# Patient Record
Sex: Male | Born: 1955
Health system: Southern US, Community
[De-identification: ages and names within clinical notes are randomized; demographics above are authoritative.]

## PROBLEM LIST (undated history)

## (undated) DIAGNOSIS — G8929 Other chronic pain: Secondary | ICD-10-CM

## (undated) DIAGNOSIS — M545 Low back pain: Secondary | ICD-10-CM

## (undated) DIAGNOSIS — K219 Gastro-esophageal reflux disease without esophagitis: Secondary | ICD-10-CM

## (undated) HISTORY — DX: Gastro-esophageal reflux disease without esophagitis: K21.9

## (undated) HISTORY — DX: Low back pain: M54.5

## (undated) HISTORY — DX: Other chronic pain: G89.29

---

## 2008-02-15 DIAGNOSIS — M545 Low back pain, unspecified: Secondary | ICD-10-CM

## 2008-02-15 DIAGNOSIS — G8929 Other chronic pain: Secondary | ICD-10-CM

## 2008-02-15 HISTORY — DX: Other chronic pain: G89.29

## 2008-02-15 HISTORY — DX: Low back pain, unspecified: M54.50

## 2014-03-05 ENCOUNTER — Encounter (HOSPITAL_COMMUNITY): Payer: Self-pay | Admitting: Physical Medicine and Rehabilitation

## 2014-03-05 ENCOUNTER — Emergency Department (HOSPITAL_COMMUNITY)
Admission: EM | Admit: 2014-03-05 | Discharge: 2014-03-05 | Disposition: A | Discharge status: Home / Self Care | Payer: Medicaid Other | Attending: Emergency Medicine | Admitting: Emergency Medicine

## 2014-03-05 DIAGNOSIS — Z720 Tobacco use: Secondary | ICD-10-CM | POA: Diagnosis not present

## 2014-03-05 DIAGNOSIS — M545 Low back pain, unspecified: Secondary | ICD-10-CM

## 2014-03-05 DIAGNOSIS — M549 Dorsalgia, unspecified: Secondary | ICD-10-CM | POA: Diagnosis present

## 2014-03-05 MED ORDER — HYDROCODONE-ACETAMINOPHEN 5-325 MG PO TABS: 1.0000 | ORAL_TABLET | ORAL | Status: DC | PRN

## 2014-03-05 MED ORDER — PREDNISONE 20 MG PO TABS
60.0000 mg | ORAL_TABLET | Freq: Once | ORAL | Status: AC
  Administered 2014-03-05: 60 mg via ORAL
  Filled 2014-03-05: qty 3

## 2014-03-05 MED ORDER — PREDNISONE 20 MG PO TABS: 40.0000 mg | ORAL_TABLET | Freq: Every day | ORAL | Status: DC

## 2014-03-05 MED ORDER — HYDROCODONE-ACETAMINOPHEN 5-325 MG PO TABS
1.0000 | ORAL_TABLET | Freq: Once | ORAL | Status: AC
  Administered 2014-03-05: 1 via ORAL
  Filled 2014-03-05: qty 1

## 2014-03-05 NOTE — ED Notes (Signed)
EDP at bedside  

## 2014-03-05 NOTE — ED Notes (Signed)
Pt states lower back pain, ongoing x7 years. States pain became worse last night. 9/10 pain upon arrival to ED. No signs of distress noted.
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
## 2014-03-05 NOTE — ED Notes (Signed)
Pt came from [place] 1 month and 2 weeks ago. Pt has no PCP.

## 2014-03-05 NOTE — Discharge Instructions (Signed)
Please follow the directions provided. Use the resource guide to establish care with a primary care provider. Take the prednisone daily for 5 days to help with pain and inflammation in your back. You may take the Vicodin as needed for back pain to help you rest. Do not drive or work while taking the Vicodin, because it can make you sleepy. Don't hesitate to return for any new, worsening, or concerning symptoms.  SEEK IMMEDIATE MEDICAL CARE IF:  You have pain that radiates from your back into your legs.  You develop new bowel or bladder control problems.  You have unusual weakness or numbness in your arms or legs.  You develop nausea or vomiting.  You develop abdominal pain.  You feel faint.    Emergency Department Resource Guide 1) Find a Doctor and Pay Out of Pocket Although you won't have to find out who is covered by your insurance plan, it is a good idea to ask around and get recommendations. You will then need to call the office and see if the doctor you have chosen will accept you as a new patient and what types of options they offer for patients who are self-pay. Some doctors offer discounts or will set up payment plans for their patients who do not have insurance, but you will need to ask so you aren't surprised when you get to your appointment.  2) Contact Your Local Health Department Not all health departments have doctors that can see patients for sick visits, but many do, so it is worth a call to see if yours does. If you don't know where your local health department is, you can check in your phone book. The CDC also has a tool to help you locate your state's health department, and many state websites also have listings of all of their local health departments.  3) Find a [facility] If your illness is not likely to be very severe or complicated, you may want to try a walk in clinic. These are popping up all over the country in pharmacies, drugstores, and shopping centers. They're  usually staffed by nurse practitioners or physician assistants that have been trained to treat common illnesses and complaints. They're usually fairly quick and inexpensive. However, if you have serious medical issues or chronic medical problems, these are probably not your best option.  No Primary Care Doctor: - Call Health Connect at  [phone] - they can help you locate a primary care doctor that  accepts your insurance, provides certain services, etc. - Physician Referral Service- [phone]  Chronic Pain Problems: Organization         Address  Phone   Notes  [facility]  [phone] Patients need to be referred by their primary care doctor.   Medication Assistance: Organization         Address  Phone   Notes  [facility] Medication [facility] [address] [place], [place] [address] [phone] --Must be a resident of [facility] -- Must have NO insurance coverage whatsoever (no Medicaid/ Medicare, etc.) -- The pt. MUST have a primary care doctor that directs their care regularly and follows them in the community   MedAssist  [phone]   [employer]  [phone]    Agencies that provide inexpensive medical care: Organization         Address  Phone   Notes  [place]  [phone]   [facility]    (  [phone]   [place], [place] [address] [phone]   [place] [address] [place] [phone]   Planned Parenthood    [phone]   [facility]    [phone]   [place] and [address], [place] Phone:  [phone], Fax:  [phone] Hours of Operation:  9 am - 6 pm, M-F.  Also accepts Medicaid/Medicare and self-pay.  [place] for [place] [address], [address], [place] Phone: [phone], Fax: [phone]. Hours  of Operation:  8:30 am - 5:30 pm, M-F.  Also accepts Medicaid and self-pay.  [facility] [place] [address], [place] Phone: [phone]   [place], [place], [place] [phone], Ext. [phone] Mondays & Thursdays: 7-9 AM.  First 15 patients are seen on a first come, first serve basis.    [place] Providers:  Organization         Address  Phone   Notes  [facility] [address], [place] [phone] Also accepts self-pay patients.  [facility] [address], [place]  [phone]   [employer], [address], [place] [phone]   [facility] [address], [place] [phone]   Lucianne Lei [address], [place]   [phone] Only accepts [place] Access [place] patients after they have their name applied to their card.   Self-Pay (no insurance) in [facility]:  Organization         Address  Phone   Notes  Sickle Cell Patients, [facility] [place] [phone]   [facility] [place] [phone]   [facility] [place]  [place], [place], [place] [phone]   [facility]/Dr. Osei-Bonsu  [address] [place] or [address], [place] [phone] Phone number for both [place] and [place] locations is the same.  Urgent Medical and [facility] [address] [place] [phone]   [facility] [address] [place] or [address] [phone]   [facility] [address] [place] [phone], phone; [phone], fax Sees patients 1st and 3rd Saturday of every month.  Must not qualify for public or private insurance (i.e. Medicaid,  Medicare, [place] Health Choice, Veterans' Benefits)  Household income should be no more than 200% of the poverty level The clinic cannot treat you if you are pregnant or think you are pregnant  Sexually transmitted diseases are not treated at the clinic.    Dental Care: Organization         Address  Phone  Notes  [facility] Department of [facility] [place] [phone] Accepts children up to age 84 who are enrolled in [place] or [place]; pregnant women with a Medicaid card; and children who have applied for Medicaid or [place] Health Choice, but were declined, whose parents can pay a reduced fee at time of service.  [facility] Department of [facility]  [address], [place] [phone] Accepts children up  to age 42 who are enrolled in Medicaid or [place] Health Choice; pregnant women with a Medicaid card; and children who have applied for Medicaid or [place] Health Choice, but were declined, whose parents can pay a reduced fee at time of service.  [place] Adult Dental Access PROGRAM  [place] [phone] Patients are seen by appointment only. Walk-ins are not accepted. [place] will see patients 36 years of age and older. Monday - Tuesday (8am-5pm) Most Wednesdays (8:30-5pm) $30 per visit, cash only  [facility] Adult Dental Access PROGRAM  [address], [facility] [phone] Patients are seen by appointment only. Walk-ins are not accepted. [facility] will see patients 21 years of age and older. One Wednesday Evening (Monthly: Volunteer Based).  $30 per visit, cash only  [place]  [phone] for adults; Children under age 43, call Graduate Pediatric Dentistry at [phone]. Children aged 39-14, please call [phone] to request a pediatric application.  Dental services are provided in all areas of dental care including fillings, crowns  and bridges, complete and partial dentures, implants, gum treatment, root canals, and extractions. Preventive care is also provided. Treatment is provided to both adults and children. Patients are selected via a lottery and there is often a waiting list.   [facility] [address] [place]  [phone] [email or web address]   [facility] [address] [place], [place] [phone], Ext. [phone] Second and Fourth Thursday of each month, opens at 6:30 AM; Clinic ends at 9 AM.  Patients are seen on a first-come first-served basis, and a limited number are seen during each clinic.   [facility]  [address] [place], [place] [phone]   Eligibility Requirements You must have lived in [place], [place], or [place] counties for at least the last three months.   You cannot be eligible for state or federal sponsored [employer], including [employer], [place], or [employer].   You generally cannot be eligible for healthcare insurance through your employer.    How to apply: Eligibility screenings are held every Tuesday and Wednesday afternoon from 1:00 pm until 4:00 pm. You do not need an appointment for the interview!  [facility] [address] [place], [place]   [place]  [place]  [place]  [phone]    Behavioral Health Resources in the Community: Intensive Outpatient Programs Organization         Address  Phone  Notes  [place]. [address], [place], [place] [phone]   [facility] Outpatient [address], [place], [place]   ADS: Alcohol & Drug Svcs [address] [place], [place]   [place] [address]  [place], [place] or [phone]   Substance Abuse  Resources Organization         Address  Phone  Notes  Alcohol and Drug Services  [phone]   [place]  [phone]   [facility] [place]   Chinita Pester  [phone]   Residential & Outpatient Substance Abuse Program  [phone]   Psychological Services Organization         Address  Phone  Notes  [place]  [place]  [place]   [place]  [address] [place] or [phone]    Mobile Crisis Teams Organization         Address  Phone  Notes  [facility], Mobile Crisis Care Unit  [phone]   [facility]  [address] [place], [place]   [place] [address] [place] [phone]    Self-Help/Support Groups Organization         Address  Phone             Notes  [place] of [place] - variety of support groups  [place] Call for more information  Narcotics Anonymous (NA), [facility]aring [facility] [address], [employer] [place]  2 meetings at this location   Special educational needs teacher         Address  Phone  Notes  [facility]idential Treatment [place],    [place]  [phone]   [facility]  [address], [place] [phone], [place], [place]   [place], [place] [phone] Admissions: 8am-3pm M-F  Incentives Substance [place] [address],    [place], [place] [phone]   [facility] [address] [place], [place], [place]   [facility] [address]  [place], [place]   [facility]ight Programs - Intensive Outpatient [address], [place], [place]   [facility] [place].,  [place], [place] [phone] or [phone]   Residential Treatment Services (RTS) [address] [place], [place] Accepts Medicaid  [facility] [place] [address]  [place] [phone] Substance Abuse/Addiction Treatment   [facility] Organization         Address  Phone  Notes  [facility]  [phone]   Domenic Schwab, PhD [address] Arlis [place], [place]   [phone] or [phone]   [facility] [place], [place] [phone]   [facility] [address] [place], [place] [phone] Insurance/Medicaid/sponsorship through [facility] and Families [address] [place]                                    [place], [place] [phone] [place] [address], [place] [phone]    Dr. Adele Schilder  [phone]   [facility] Dept. 1) [address] [place] 2) [place] 3)  [place] [address], [place] [phone]  [phone]   [place] [phone] or [phone] (After Hours)

## 2014-03-08 NOTE — Progress Notes (Signed)
ED CM spoke with patient via interpreter  Phone regarding establishing care with the [facility], patient agreeable. Provided address and phone number to [facility] via interpreter, patient encouraged to go over to the clinic when he is discharged today to establish. [place] contacted regarding patient coming in after discharge to [facility] care, scheduler are agreeable waiting on patient to arrival. No further ED CM needs identified.

## 2014-03-17 ENCOUNTER — Ambulatory Visit: Payer: Medicaid Other | Attending: Family Medicine | Admitting: Family Medicine

## 2014-03-17 ENCOUNTER — Encounter: Payer: Self-pay | Admitting: Family Medicine

## 2014-03-17 VITALS — BP 102/67 | HR 90 | Temp 98.2°F | Resp 16 | Ht 65.75 in | Wt 148.8 lb

## 2014-03-17 DIAGNOSIS — R1084 Generalized abdominal pain: Secondary | ICD-10-CM

## 2014-03-17 DIAGNOSIS — R202 Paresthesia of skin: Secondary | ICD-10-CM

## 2014-03-17 DIAGNOSIS — Z114 Encounter for screening for human immunodeficiency virus [HIV]: Secondary | ICD-10-CM | POA: Insufficient documentation

## 2014-03-17 DIAGNOSIS — Z87891 Personal history of nicotine dependence: Secondary | ICD-10-CM | POA: Insufficient documentation

## 2014-03-17 DIAGNOSIS — G8929 Other chronic pain: Secondary | ICD-10-CM

## 2014-03-17 DIAGNOSIS — M545 Low back pain, unspecified: Secondary | ICD-10-CM | POA: Insufficient documentation

## 2014-03-17 DIAGNOSIS — Z720 Tobacco use: Secondary | ICD-10-CM

## 2014-03-17 DIAGNOSIS — F17200 Nicotine dependence, unspecified, uncomplicated: Secondary | ICD-10-CM

## 2014-03-17 DIAGNOSIS — Z113 Encounter for screening for infections with a predominantly sexual mode of transmission: Secondary | ICD-10-CM

## 2014-03-17 LAB — CBC
HEMATOCRIT: 46.3 % (ref 39.0–52.0)
Hemoglobin: 15.7 g/dL (ref 13.0–17.0)
MCH: 32.4 pg (ref 26.0–34.0)
MCHC: 33.9 g/dL (ref 30.0–36.0)
MCV: 95.5 fL (ref 78.0–100.0)
MPV: 9.3 fL (ref 8.6–12.4)
Platelets: 247 10*3/uL (ref 150–400)
RBC: 4.85 MIL/uL (ref 4.22–5.81)
RDW: 14 % (ref 11.5–15.5)
WBC: 6.3 10*3/uL (ref 4.0–10.5)

## 2014-03-17 LAB — POCT URINALYSIS DIPSTICK
Bilirubin, UA: NEGATIVE
Glucose, UA: NEGATIVE
KETONES UA: NEGATIVE
Nitrite, UA: NEGATIVE
PH UA: 5.5
Protein, UA: NEGATIVE
Spec Grav, UA: 1.02
Urobilinogen, UA: 1

## 2014-03-17 LAB — COMPLETE METABOLIC PANEL WITH GFR
ALBUMIN: 3.8 g/dL (ref 3.5–5.2)
ALK PHOS: 47 U/L (ref 39–117)
ALT: 27 U/L (ref 0–53)
AST: 27 U/L (ref 0–37)
BILIRUBIN TOTAL: 0.7 mg/dL (ref 0.2–1.2)
BUN: 10 mg/dL (ref 6–23)
CHLORIDE: 109 meq/L (ref 96–112)
CO2: 27 meq/L (ref 19–32)
Calcium: 9 mg/dL (ref 8.4–10.5)
Creat: 0.68 mg/dL (ref 0.50–1.35)
GFR, Est African American: >89 mL/min
Glucose, Bld: 86 mg/dL (ref 70–99)
Potassium: 4.6 mEq/L (ref 3.5–5.3)
Sodium: 140 mEq/L (ref 135–145)
Total Protein: 6.2 g/dL (ref 6.0–8.3)

## 2014-03-17 LAB — GLUCOSE, POCT (MANUAL RESULT ENTRY): POC Glucose: 112 mg/dl — AB (ref 70–99)

## 2014-03-17 LAB — POCT GLYCOSYLATED HEMOGLOBIN (HGB A1C): HEMOGLOBIN A1C: 5.5

## 2014-03-17 MED ORDER — NAPROXEN 500 MG PO TABS: 500.0000 mg | ORAL_TABLET | Freq: Two times a day (BID) | ORAL | Status: DC | PRN

## 2014-03-17 MED ORDER — GABAPENTIN 300 MG PO CAPS: 300.0000 mg | ORAL_CAPSULE | Freq: Three times a day (TID) | ORAL | Status: DC

## 2014-03-17 MED ORDER — NAPROXEN 500 MG PO TABS: 500.0000 mg | ORAL_TABLET | Freq: Two times a day (BID) | ORAL | Status: DC

## 2014-03-17 NOTE — Progress Notes (Signed)
Patient is here to establish care He was recently seen in the ED for chronic back pain, abdominal pain, and burning in his hands and feet. He is a current 0.5 ppd smoker and reports he drinks beer on the weekends Patient vaccinations are current

## 2014-03-17 NOTE — Assessment & Plan Note (Signed)
Screening HIV  

## 2014-03-17 NOTE — Patient Instructions (Signed)
[name],   Thank you for coming in today. It was a pleasure meeting you. I look forward to being your primary doctor.   1. Chronic low back pain with bulging disc: Start gabapentin 300 mg at night for 3 days, then twice daily for 3 days, then three time daily. Take naproxen, antiinflammatory twice daily with food.  Referral to physical therapy.   You will be called with lab results  F/u in 6 weeks for f/u low back pain and smoking cessation.   Dr. Adrian [place]
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
## 2014-03-17 NOTE — Assessment & Plan Note (Signed)
1. Chronic low back pain with bulging disc: Start gabapentin 300 mg at night for 3 days, then twice daily for 3 days, then three time daily. Take naproxen, antiinflammatory twice daily with food.  Referral to physical therapy.

## 2014-03-18 ENCOUNTER — Telehealth: Payer: Self-pay | Admitting: *Deleted

## 2014-03-18 LAB — HIV ANTIBODY (ROUTINE TESTING W REFLEX): HIV 1&2 Ab, 4th Generation: NONREACTIVE

## 2014-03-18 NOTE — Telephone Encounter (Signed)
-----   Message from Minerva Ends, MD sent at 03/18/2014  9:20 AM EST ----- Normal CBC, CMP and HIV negative

## 2014-03-18 NOTE — Telephone Encounter (Signed)
[employer] [place] [phone] Pt aware of lab resuts

## 2014-03-31 ENCOUNTER — Ambulatory Visit: Payer: Medicaid Other

## 2014-04-15 ENCOUNTER — Ambulatory Visit: Payer: Medicaid Other

## 2014-04-17 ENCOUNTER — Ambulatory Visit: Payer: Medicaid Other | Attending: Family Medicine

## 2014-04-17 DIAGNOSIS — G8929 Other chronic pain: Secondary | ICD-10-CM | POA: Diagnosis not present

## 2014-04-17 DIAGNOSIS — M545 Low back pain, unspecified: Secondary | ICD-10-CM

## 2014-04-17 NOTE — Patient Instructions (Signed)
Discussed fact <Medicaid does not pay for PT for LBP. We will contact vendor for home TENS trial

## 2014-04-17 NOTE — Therapy (Signed)
[place], [place], [address] Phone: [phone]   Fax:  [phone]  Physical Therapy Evaluation  Patient Details  Name: Nathaniel Robinson MRN: [id number] Date of Birth: 07/20/1955 Referring Provider:  Minerva Ends, MD  Encounter Date: 04/17/2014      PT End of Session - 04/17/14 0902    Visit Number 1   Number of Visits 1   PT Start Time 0840   PT Stop Time 0920   PT Time Calculation (min) 40 min   Activity Tolerance Patient tolerated treatment well   Behavior During Therapy [facility] for tasks assessed/performed      Past Medical History  Diagnosis Date  . Chronic low back pain 2010     No past surgical history on file.  There were no vitals taken for this visit.  Visit Diagnosis:  Chronic lower back pain - Plan: PT plan of care cert/re-cert      Subjective Assessment - 04/17/14 0842    Symptoms He reports lower back pain . 5 years duration. No therapy before   Pertinent History No injury   Patient Stated Goals Decrease pain   Currently in Pain? Yes   Pain Score 5    Pain Location Back   Pain Orientation Lower   Pain Descriptors / Indicators Constant   Pain Type Chronic pain   Pain Onset More than a month ago   Pain Frequency Constant   Aggravating Factors  walking   Pain Relieving Factors Medication does not help. Always the same   Multiple Pain Sites No          [facility] PT Assessment - 04/17/14 0857    Assessment   Medical Diagnosis chronic LBP   Onset Date --  5 years duration   Prior Therapy None   Precautions   Precautions None   Restrictions   Weight Bearing Restrictions No   Balance Screen   Has the patient fallen in the past 6 months No   Has the patient had a decrease in activity level because of a fear of falling?  No   Is the patient reluctant to leave their home because of a fear of falling?  No   Posture/Postural Control   Posture Comments Appears he may  have  some scoliosis withRT scapula lower than LT    ROM / Strength   AROM / PROM / Strength AROM;Strength;PROM   AROM   AROM Assessment Site Lumbar   Lumbar Flexion he can touch mid tibia   Lumbar Extension decreeased 2/3   Lumbar - Right Side Bend WNL   Lumbar - Left Side Bend WNL   PROM   Overall PROM Comments Hip and SLR WFL   Strength   Overall Strength Comments Normal LE strength and good mobility   Palpation   Palpation Tender lower back and sacral area                  [facility] Adult PT Treatment/Exercise - 04/17/14 0857    Modalities   Modalities Electrical Stimulation   Electrical Stimulation   Electrical Stimulation Location back, lower   Electrical Stimulation Action IFC   Electrical Stimulation Parameters L14   Electrical Stimulation Goals Pain                PT Education - 04/17/14 0902    Education provided Yes   Education Details No coverage with Medicaid, use of TENS unit at home and vendor should call  Person(s) Educated Patient   Methods Explanation   Comprehension Verbalized understanding                    Plan - 04/17/14 0903    Clinical Impression Statement Will contact vendor for home TENS. as chronic back pain not covered for PT   Recommended Other Services TENS vendor   Consulted and Agree with Plan of Care Patient         Problem List Patient Active Problem List   Diagnosis Date Noted  . Current smoker 03/17/2014  . Screening for STD (sexually transmitted disease) 03/17/2014  . Chronic low back pain     Darrel Hoover PT 04/17/2014, 9:10 AM  [facility] [address] [place], [place], [address] Phone: [phone]   Fax:  [phone]  I am discharging him though he said he would consider self pay but later in conversation said he could not do TENS  Unless free  so I dont think he will return for formal PT

## 2014-04-21 ENCOUNTER — Telehealth: Payer: Self-pay | Admitting: Family Medicine

## 2014-04-21 ENCOUNTER — Other Ambulatory Visit: Payer: Self-pay | Admitting: Family Medicine

## 2014-04-21 DIAGNOSIS — G8929 Other chronic pain: Secondary | ICD-10-CM

## 2014-04-21 DIAGNOSIS — M545 Low back pain: Principal | ICD-10-CM

## 2014-04-21 MED ORDER — NAPROXEN 500 MG PO TABS: 500.0000 mg | ORAL_TABLET | Freq: Two times a day (BID) | ORAL | Status: DC | PRN

## 2014-04-21 MED ORDER — GABAPENTIN 300 MG PO CAPS: 300.0000 mg | ORAL_CAPSULE | Freq: Three times a day (TID) | ORAL | Status: DC

## 2014-04-21 NOTE — Telephone Encounter (Signed)
Pt has an appt on 04-29-14 for follow-up. Refills sent. Pt is aware. Stafford Bing, CMA

## 2014-04-21 NOTE — Telephone Encounter (Signed)
Patient would like a refill on all medications; patient has come in today to schedule and appointment and request refills; please f/u with patient about these requests;

## 2014-04-29 ENCOUNTER — Ambulatory Visit (HOSPITAL_BASED_OUTPATIENT_CLINIC_OR_DEPARTMENT_OTHER): Payer: Medicaid Other | Admitting: Family Medicine

## 2014-04-29 ENCOUNTER — Ambulatory Visit (HOSPITAL_COMMUNITY)
Admission: RE | Admit: 2014-04-29 | Discharge: 2014-04-29 | Disposition: A | Discharge status: Home / Self Care | Payer: Medicaid Other | Source: Ambulatory Visit | Attending: Family Medicine | Admitting: Family Medicine

## 2014-04-29 ENCOUNTER — Telehealth: Payer: Self-pay | Admitting: *Deleted

## 2014-04-29 ENCOUNTER — Encounter: Payer: Self-pay | Admitting: Family Medicine

## 2014-04-29 VITALS — BP 115/70 | HR 100 | Temp 97.6°F | Resp 16 | Ht 64.96 in | Wt 147.7 lb

## 2014-04-29 DIAGNOSIS — G8929 Other chronic pain: Secondary | ICD-10-CM

## 2014-04-29 DIAGNOSIS — Z0000 Encounter for general adult medical examination without abnormal findings: Secondary | ICD-10-CM | POA: Diagnosis not present

## 2014-04-29 DIAGNOSIS — M47896 Other spondylosis, lumbar region: Secondary | ICD-10-CM | POA: Insufficient documentation

## 2014-04-29 DIAGNOSIS — F17200 Nicotine dependence, unspecified, uncomplicated: Secondary | ICD-10-CM

## 2014-04-29 DIAGNOSIS — Z720 Tobacco use: Secondary | ICD-10-CM

## 2014-04-29 DIAGNOSIS — M545 Low back pain: Secondary | ICD-10-CM | POA: Diagnosis present

## 2014-04-29 MED ORDER — NAPROXEN 500 MG PO TABS: 500.0000 mg | ORAL_TABLET | Freq: Two times a day (BID) | ORAL | Status: DC | PRN

## 2014-04-29 MED ORDER — CYCLOBENZAPRINE HCL 10 MG PO TABS: 10.0000 mg | ORAL_TABLET | Freq: Three times a day (TID) | ORAL | Status: DC | PRN

## 2014-04-29 NOTE — Patient Instructions (Addendum)
Nathaniel Robinson,  Thank you for coming in today.  1. Low back pain for 5 years Flexeril up to three times daily. Naproxen up to twice daily with food  Low back x-ray Pain management referral  Ortho referral.   F/u in 6 weeks   Dr. Adrian Blackwater

## 2014-04-29 NOTE — Progress Notes (Signed)
   Subjective:    Patient ID: Nathaniel Robinson, male    DOB: 1955/08/24, 59 y.o.   MRN: [id number] CC: chronic low back pain  [place] interpreter on the phone  HPI  1. Back pain: since 2011. Treated with injections in [place] when fist dx. Injections into muscle helped for two weeks. Undergoing PT. TENs unit is making pain worse. Pain is b/l low back, non radiating. Tingling and sharp pains. No fecal or urinary incontinence.    Review of Systems  Constitutional: Negative for fever, diaphoresis and unexpected weight change.  Genitourinary: Negative for urgency.  Musculoskeletal: Positive for myalgias and back pain.  Neurological: Negative for weakness.       Objective:   Physical Exam BP 115/70 mmHg  Pulse 100  Temp(Src) 97.6 F (36.4 C) (Oral)  Resp 16  Ht 5' 4.96" (1.65 m)  Wt 147 lb 11.3 oz (67 kg)  BMI 24.61 kg/m2  SpO2 99% General appearance: alert, cooperative and no distress  Back Exam: Back: Normal Curvature, no deformities or CVA tenderness  Paraspinal Tenderness: b/l L4 and L5  LE Strength 5/5  LE Sensation: in tact  LE Reflexes 2+ and symmetric  Straight leg raise: negative       Assessment & Plan:

## 2014-04-29 NOTE — Assessment & Plan Note (Signed)
A:  Low back pain for 5 years. Suspect DJD, no sciatica on exam.  P: Flexeril up to three times daily. Naproxen up to twice daily with food  Low back x-ray Pain management referral  Ortho referral.

## 2014-04-29 NOTE — Progress Notes (Signed)
Pt is here following up on his chronic back pain. Pt is trying out a tens unit and he said that it does not help. Pt states that he has a spot on his butt that it keeps coming back that is very painful. Interpreter # [phone]

## 2014-04-29 NOTE — Assessment & Plan Note (Signed)
GI referral for screening colonoscopy.

## 2014-04-29 NOTE — Telephone Encounter (Signed)
Pt requesting letter stating he is been seen here for back pain

## 2014-05-02 NOTE — Telephone Encounter (Signed)
Letter written and placed up front for pick up  

## 2014-05-06 ENCOUNTER — Telehealth: Payer: Self-pay | Admitting: Emergency Medicine

## 2014-05-06 NOTE — Telephone Encounter (Signed)
;  anguage line will set up the phone call

## 2014-05-07 ENCOUNTER — Telehealth: Payer: Self-pay | Admitting: Family Medicine

## 2014-05-07 NOTE — Telephone Encounter (Signed)
FYI Only: Patient came in this morning to pick up paperwork filled out by PCP;

## 2014-05-16 ENCOUNTER — Encounter: Payer: Self-pay | Admitting: Family Medicine

## 2014-05-16 ENCOUNTER — Ambulatory Visit: Payer: Medicaid Other | Attending: Family Medicine | Admitting: Family Medicine

## 2014-05-16 DIAGNOSIS — G8929 Other chronic pain: Secondary | ICD-10-CM | POA: Diagnosis not present

## 2014-05-16 DIAGNOSIS — Z720 Tobacco use: Secondary | ICD-10-CM | POA: Diagnosis not present

## 2014-05-16 DIAGNOSIS — F17200 Nicotine dependence, unspecified, uncomplicated: Secondary | ICD-10-CM

## 2014-05-16 DIAGNOSIS — M545 Low back pain: Secondary | ICD-10-CM | POA: Diagnosis not present

## 2014-05-16 MED ORDER — NICOTINE 7 MG/24HR TD PT24: 7.0000 mg | MEDICATED_PATCH | Freq: Every day | TRANSDERMAL | Status: DC

## 2014-05-16 MED ORDER — NAPROXEN 500 MG PO TABS: 500.0000 mg | ORAL_TABLET | Freq: Two times a day (BID) | ORAL | Status: DC | PRN

## 2014-05-16 MED ORDER — CYCLOBENZAPRINE HCL 10 MG PO TABS: 10.0000 mg | ORAL_TABLET | Freq: Three times a day (TID) | ORAL | Status: DC | PRN

## 2014-05-16 MED ORDER — NICOTINE 14 MG/24HR TD PT24: 14.0000 mg | MEDICATED_PATCH | Freq: Every day | TRANSDERMAL | Status: DC

## 2014-05-16 MED ORDER — NICOTINE 21 MG/24HR TD PT24: 21.0000 mg | MEDICATED_PATCH | Freq: Every day | TRANSDERMAL | Status: DC

## 2014-05-16 NOTE — Assessment & Plan Note (Signed)
Smoking:  Smoking cessation support: smoking cessation hotline: 1-800-QUIT-NOW.  Smoking cessation classes are available through [facility] and [facility]. Call 4436145247 or visit our website at [email or web address] Nicotine patch 21 mg for 6 weeks Nicotine patch 14mg  for 2 weeks  Nicotine patch 7 mg for 2 weeks

## 2014-05-16 NOTE — Progress Notes (Signed)
[employer] [phone] F/U Back pain, medication not helping  (Back pain x 5 year) Back pain( worsen in the morning  Requesting MRI referral  Requesting help to quit smoking

## 2014-05-16 NOTE — Patient Instructions (Addendum)
Nathaniel Robinson,  1. Chronic low back pain:  Mild degenerative disease on x-ray  MRI ordered  Continue flexeril (before bed) and naproxen (with food)   2. Smoking:  Smoking cessation support: smoking cessation hotline: 1-800-QUIT-NOW.  Smoking cessation classes are available through [facility] and [facility]. Call (567)694-7488 or visit our website at [email or web address] Nicotine patch 21 mg for 6 weeks Nicotine patch 14mg  for 2 weeks  Nicotine patch 7 mg for 2 weeks   F/u in 6 weeks for low back   Dr. Adrian Blackwater

## 2014-05-16 NOTE — Progress Notes (Signed)
   Subjective:    Patient ID: Bradrick Kamau, male    DOB: [birth year], 59 y.o.   MRN: [id number] CC: chronic low back pain, smoking cessation  HPI  1. Chronic low back pain: x 5-7 years. Low back. R side. Worse in early AM. Flexeril and naproxen helps.   2. Smoking cessation: smoking 1 PPD for 4 months. Prior to that was taking 1/2 PPD. Desires to quit.   Soc Hx: current smoker   Review of Systems  Constitutional: Negative for fever and chills.      Objective:   Physical Exam BP 112/73 mmHg  Pulse 88  Temp(Src) 97.7 F (36.5 C) (Oral)  Resp 16  Wt 150 lb (68.04 kg)  SpO2 99% General appearance: alert, cooperative and no distress Back: mild lumbar lordosis, no spinal tenderness, MSK tightness R low back  Lungs: normal WOB        Assessment & Plan:

## 2014-05-16 NOTE — Assessment & Plan Note (Signed)
Chronic low back pain:  Mild degenerative disease on x-ray  MRI ordered  Continue flexeril (before bed) and naproxen (with food)

## 2014-05-19 ENCOUNTER — Other Ambulatory Visit: Payer: Self-pay | Admitting: Infectious Disease

## 2014-05-19 ENCOUNTER — Ambulatory Visit
Admission: RE | Admit: 2014-05-19 | Discharge: 2014-05-19 | Disposition: A | Discharge status: Home / Self Care | Payer: No Typology Code available for payment source | Source: Ambulatory Visit | Attending: Infectious Disease | Admitting: Infectious Disease

## 2014-05-19 DIAGNOSIS — R7611 Nonspecific reaction to tuberculin skin test without active tuberculosis: Secondary | ICD-10-CM

## 2014-06-06 ENCOUNTER — Ambulatory Visit (HOSPITAL_COMMUNITY)
Admission: RE | Admit: 2014-06-06 | Discharge: 2014-06-06 | Disposition: A | Discharge status: Home / Self Care | Payer: Medicaid Other | Source: Ambulatory Visit | Attending: Family Medicine | Admitting: Family Medicine

## 2014-06-06 ENCOUNTER — Other Ambulatory Visit: Payer: Self-pay | Admitting: Family Medicine

## 2014-06-06 DIAGNOSIS — M5137 Other intervertebral disc degeneration, lumbosacral region: Secondary | ICD-10-CM | POA: Diagnosis not present

## 2014-06-06 DIAGNOSIS — M4806 Spinal stenosis, lumbar region: Secondary | ICD-10-CM | POA: Diagnosis not present

## 2014-06-06 DIAGNOSIS — G8929 Other chronic pain: Secondary | ICD-10-CM | POA: Insufficient documentation

## 2014-06-06 DIAGNOSIS — M545 Low back pain: Secondary | ICD-10-CM

## 2014-06-06 DIAGNOSIS — M5127 Other intervertebral disc displacement, lumbosacral region: Secondary | ICD-10-CM | POA: Insufficient documentation

## 2014-06-06 NOTE — Assessment & Plan Note (Signed)
MRI does reveal spinal stenosis at L4-L5 mostly but this is mild and would not necessarily cause the severity of symptoms patient is experiencing.  Referral to neurosurgery to explore additional treatment options, ? Injections

## 2014-06-10 ENCOUNTER — Ambulatory Visit: Payer: Medicaid Other | Admitting: Family Medicine

## 2014-06-10 ENCOUNTER — Telehealth: Payer: Self-pay | Admitting: Family Medicine

## 2014-06-10 NOTE — Telephone Encounter (Signed)
Patient has come in today to get the results of his MRI; patient states he is in pain and will schedule to next available appointment to discuss with PCP; please f/u with patient about results;

## 2014-06-11 NOTE — Telephone Encounter (Signed)
Pt was given results. Stated will come in tomorrow for more information  He is riding the bus now and is unable to understand

## 2014-06-11 NOTE — Telephone Encounter (Signed)
-----   Message from Boykin Nearing, MD sent at 06/06/2014  1:53 PM EDT ----- MRI does reveal spinal stenosis at L4-L5 mostly but this is mild and would not necessarily cause the severity of symptoms patient is experiencing.  Referral to neurosurgery to explore additional treatment options, ? Injections

## 2014-06-16 ENCOUNTER — Ambulatory Visit (HOSPITAL_BASED_OUTPATIENT_CLINIC_OR_DEPARTMENT_OTHER): Payer: Medicaid Other | Admitting: Family Medicine

## 2014-06-16 ENCOUNTER — Ambulatory Visit (HOSPITAL_COMMUNITY)
Admission: RE | Admit: 2014-06-16 | Discharge: 2014-06-16 | Disposition: A | Discharge status: Home / Self Care | Payer: Medicaid Other | Source: Ambulatory Visit | Attending: Cardiology | Admitting: Cardiology

## 2014-06-16 ENCOUNTER — Other Ambulatory Visit: Payer: Self-pay

## 2014-06-16 ENCOUNTER — Encounter: Payer: Self-pay | Admitting: Family Medicine

## 2014-06-16 VITALS — BP 133/80 | HR 128 | Temp 98.7°F | Resp 18 | Ht 64.0 in | Wt 151.0 lb

## 2014-06-16 DIAGNOSIS — G8929 Other chronic pain: Secondary | ICD-10-CM

## 2014-06-16 DIAGNOSIS — R000 Tachycardia, unspecified: Secondary | ICD-10-CM | POA: Insufficient documentation

## 2014-06-16 DIAGNOSIS — Z201 Contact with and (suspected) exposure to tuberculosis: Secondary | ICD-10-CM

## 2014-06-16 DIAGNOSIS — R7611 Nonspecific reaction to tuberculin skin test without active tuberculosis: Secondary | ICD-10-CM | POA: Insufficient documentation

## 2014-06-16 DIAGNOSIS — M545 Low back pain: Secondary | ICD-10-CM | POA: Diagnosis not present

## 2014-06-16 DIAGNOSIS — I471 Supraventricular tachycardia: Secondary | ICD-10-CM | POA: Diagnosis not present

## 2014-06-16 LAB — POCT URINALYSIS DIPSTICK
Glucose, UA: NEGATIVE
Ketones, UA: NEGATIVE
LEUKOCYTES UA: NEGATIVE
Nitrite, UA: NEGATIVE
RBC UA: NEGATIVE
Spec Grav, UA: >=1.03
UROBILINOGEN UA: 0.2
pH, UA: 5.5

## 2014-06-16 MED ORDER — CYCLOBENZAPRINE HCL 10 MG PO TABS: 10.0000 mg | ORAL_TABLET | Freq: Three times a day (TID) | ORAL | Status: DC | PRN

## 2014-06-16 MED ORDER — NAPROXEN 500 MG PO TABS: 500.0000 mg | ORAL_TABLET | Freq: Two times a day (BID) | ORAL | Status: DC | PRN

## 2014-06-16 NOTE — Assessment & Plan Note (Signed)
Chronic low back pain:  Continue naproxen and flexeril as needed with food. Referrals to pain management and neurosurgery placed.

## 2014-06-16 NOTE — Progress Notes (Signed)
[employer] [place] # [id number] F/U MRI results Stated still in pain

## 2014-06-16 NOTE — Assessment & Plan Note (Signed)
Elevated heart rate today with evidence of dehydration: Drink extra water for the next 2-3 days

## 2014-06-16 NOTE — Patient Instructions (Signed)
Nathaniel Robinson,  Thank you for coming in today.  1. TB exposure: I recommend taking rifampin to prevent active TB and the spread of the bacteria. Take the medication as prescribed to prevent resistance.    2. Chronic low back pain:  Continue naproxen and flexeril as needed with food. Referrals to pain management and neurosurgery placed.   3. Elevated heart rate today with evidence of dehydration: Drink extra water for the next 2-3 days  4. Due for screening colonoscopy: GI referral has been placed.   F/u in 3 months for chronic back pain   Dr. Adrian Blackwater

## 2014-06-16 NOTE — Progress Notes (Signed)
   Subjective:    Patient ID: Nathaniel Robinson, male    DOB: 03-30-1955, 59 y.o.   MRN: [id number] CC: f/u chronic low back pain, review MRI HPI  1. Chronic low back pain: persist. Controlled with naproxen and flexeril, partially. Still with pain in AM with stiffness. Amenable to pain management and neurosurgery for epidural injections if recommended.  2. Tachycardia: noted on today's exam. Patient celebrating [place] from his country yesterday. Drank excess ETOH. No CP, SOB or palpitations.  3. TB exposure: prescribed rifampin for 4 month course. No active TB. Patient took one dose of rifampin then stopped as he was not sure if it was safe with his other meds.   Soc Hx: current smoker  Review of Systems  Constitutional: Negative for fever and chills.  Cardiovascular: Negative for chest pain and palpitations.  Musculoskeletal: Positive for back pain.       Objective:   Physical Exam BP 133/80 mmHg  Pulse 128  Temp(Src) 98.7 F (37.1 C) (Oral)  Resp 18  Wt 151 lb (68.493 kg)  SpO2 97% General appearance: alert, cooperative and no distress Lungs: clear to auscultation bilaterally Heart: S1, S2 normal, regular rate, rhythm elevated  Back Exam: Back: Normal Curvature, no deformities or CVA tenderness  Paraspinal Tenderness: none    EKG: sinus tachycardia.  Lumbar MRI 06/06/2014:  1. 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 Correlation with radiographs is recommended prior to any operative intervention. 2. Lumbar disc degeneration primarily limited to L5-S1. Small central disc protrusion and mild facet hypertrophy combine 4 mild bilateral lateral recess stenosis. 3. Mild congenital and acquired spinal stenosis at L4-L5.     Assessment & Plan:

## 2014-06-16 NOTE — Assessment & Plan Note (Signed)
TB exposure: I recommend taking rifampin to prevent active TB and the spread of the bacteria. Take the medication as prescribed to prevent resistance.

## 2014-06-17 ENCOUNTER — Ambulatory Visit (HOSPITAL_COMMUNITY): Admission: RE | Admit: 2014-06-17 | Payer: Medicaid Other | Source: Ambulatory Visit

## 2014-07-08 ENCOUNTER — Encounter: Payer: Self-pay | Admitting: Physical Medicine & Rehabilitation

## 2014-07-21 ENCOUNTER — Encounter: Payer: No Typology Code available for payment source | Admitting: Physician Assistant

## 2014-07-23 ENCOUNTER — Telehealth (HOSPITAL_COMMUNITY): Payer: Self-pay

## 2014-07-23 NOTE — Telephone Encounter (Signed)
Encounter complete. Pt given detailed instructions through an interpreter on the language lie. Interpreter was [employer] with [place] [id number]. I also called language services here at [facility] and left a voicemail that we need language services for this pt during his appointment. I hope to hear back soon. I will follow up tomorrow.

## 2014-07-23 NOTE — Telephone Encounter (Signed)
Encounter complete. 

## 2014-07-25 ENCOUNTER — Inpatient Hospital Stay (HOSPITAL_COMMUNITY): Admission: RE | Admit: 2014-07-25 | Payer: No Typology Code available for payment source | Source: Ambulatory Visit

## 2014-07-25 ENCOUNTER — Encounter: Payer: Self-pay | Admitting: Family Medicine

## 2014-07-28 ENCOUNTER — Telehealth: Payer: Self-pay | Admitting: Family Medicine

## 2014-07-28 DIAGNOSIS — R000 Tachycardia, unspecified: Secondary | ICD-10-CM

## 2014-07-28 NOTE — Telephone Encounter (Signed)
Patient has come in today to inform PCP that he had to reschedule his appointment on 6/10 for his Cup Exercise Tolerance Test because his interpreter was late; Patient is requesting another referral be placed soon before his medicaid insurance is up; please f/u with patient about referral;

## 2014-07-30 NOTE — Telephone Encounter (Signed)
Referral replaced but it looks like patient has no-showed twice with Dr. Verl Blalock, he must be sure he can make appt.

## 2014-07-30 NOTE — Telephone Encounter (Signed)
I spoke to patient and he has an appointment with Dr Verl Blalock   08/13/14 @ 9am

## 2014-08-06 ENCOUNTER — Ambulatory Visit: Payer: Medicaid Other | Attending: Family Medicine | Admitting: Family Medicine

## 2014-08-06 ENCOUNTER — Telehealth: Payer: Self-pay | Admitting: Family Medicine

## 2014-08-06 ENCOUNTER — Encounter: Payer: Self-pay | Admitting: Family Medicine

## 2014-08-06 VITALS — BP 125/73 | HR 102 | Temp 99.0°F | Resp 16 | Ht 64.0 in | Wt 154.0 lb

## 2014-08-06 DIAGNOSIS — F17200 Nicotine dependence, unspecified, uncomplicated: Secondary | ICD-10-CM | POA: Insufficient documentation

## 2014-08-06 DIAGNOSIS — I471 Supraventricular tachycardia: Secondary | ICD-10-CM

## 2014-08-06 DIAGNOSIS — G8929 Other chronic pain: Secondary | ICD-10-CM | POA: Insufficient documentation

## 2014-08-06 DIAGNOSIS — R000 Tachycardia, unspecified: Secondary | ICD-10-CM | POA: Diagnosis not present

## 2014-08-06 DIAGNOSIS — M545 Low back pain, unspecified: Secondary | ICD-10-CM

## 2014-08-06 NOTE — Progress Notes (Signed)
   Subjective:    Patient ID: Nathaniel Robinson, male    DOB: [birth year], 59 y.o.   MRN: [id number] CC: requesting referral.  [place] interpreter phone used  HPI  59 yo M with sinus tachycardia. Patient scheduled to have exercise tolerance test. Patient missed appt due to lack of interpreting services on 06/17/2014 and 07/25/2014. Has another appt scheduled for 08/13/2014 in this office with Dr. Verl Blalock.   Chronic low back pain: patient has an appt with pain management net week on 08/12/2014. He currently has a rental TENs unit from [employer] [facility] since 04/17/2014. Patient would like to convert the TENs unit to purchase, # for [facility] [phone].   Soc Hx:  Current smoker  Review of Systems     Objective:   Physical Exam BP 125/73 mmHg  Pulse 102  Temp(Src) 99 F (37.2 C) (Oral)  Resp 16  Ht 5\' 4"  (1.626 m)  Wt 154 lb (69.854 kg)  BMI 26.42 kg/m2  SpO2 98% General appearance: alert, cooperative and no distress  Lungs; normal WOB        Assessment & Plan:

## 2014-08-06 NOTE — Patient Instructions (Signed)
Mr. Hane,  Thank you for coming in today.  You may apply to extend medicaid today. You are also encouraged to apply for orange card and [place] discount in the event medicaid is cancelled.  You have appts next week.  I have called [facility] to convert TENs unit to purchase. I have a left a VM and requested a call back or fax back with information on how to convert Tens unit to purchase.   F/u with me in 3 weeks for f/u cardiology and pain management visits  Dr. Adrian Blackwater

## 2014-08-06 NOTE — Telephone Encounter (Signed)
I have called [employer] to convert TENs unit to purchase. I have a left a VM and requested a call back or fax back with information on how to convert Tens unit to purchase.

## 2014-08-06 NOTE — Progress Notes (Signed)
[employer] [place] # [id number]  Requesting referral

## 2014-08-06 NOTE — Assessment & Plan Note (Signed)
A; chronic low back pain. Has tens unit. P: appt with pain management next week Called [employer] [facility] today to get patient to keep TENS unit

## 2014-08-06 NOTE — Assessment & Plan Note (Signed)
A; improved.  P: Cardiology appt next week

## 2014-08-12 ENCOUNTER — Encounter: Payer: Medicaid Other | Attending: Physical Medicine & Rehabilitation

## 2014-08-12 ENCOUNTER — Encounter: Payer: Self-pay | Admitting: Physical Medicine & Rehabilitation

## 2014-08-12 ENCOUNTER — Ambulatory Visit (HOSPITAL_BASED_OUTPATIENT_CLINIC_OR_DEPARTMENT_OTHER): Payer: Medicaid Other | Admitting: Physical Medicine & Rehabilitation

## 2014-08-12 VITALS — BP 137/80 | HR 102 | Resp 16

## 2014-08-12 DIAGNOSIS — G894 Chronic pain syndrome: Secondary | ICD-10-CM

## 2014-08-12 DIAGNOSIS — M4328 Fusion of spine, sacral and sacrococcygeal region: Secondary | ICD-10-CM | POA: Insufficient documentation

## 2014-08-12 DIAGNOSIS — Z5181 Encounter for therapeutic drug level monitoring: Secondary | ICD-10-CM | POA: Diagnosis not present

## 2014-08-12 DIAGNOSIS — M533 Sacrococcygeal disorders, not elsewhere classified: Secondary | ICD-10-CM

## 2014-08-12 MED ORDER — TRAMADOL HCL 50 MG PO TABS: 50.0000 mg | ORAL_TABLET | Freq: Two times a day (BID) | ORAL | Status: DC | PRN

## 2014-08-12 MED ORDER — KETOROLAC TROMETHAMINE 60 MG/2ML IM SOLN
60.0000 mg | Freq: Once | INTRAMUSCULAR | Status: AC
  Administered 2014-08-12: 60 mg via INTRAMUSCULAR

## 2014-08-12 NOTE — Progress Notes (Signed)
Subjective:    Patient ID: Nathaniel Robinson, male    DOB: Mar 26, 1955, 59 y.o.   MRN: [id number]  HPI 59 year old male from [place] with a five-year history of low back pain. Pain started when he lifted a large rock. He states that when he walks for more than 30 minutes the pain starts bothering him. The patient does not have any pain going down his legs. Patient received treatment with 5 shots these were performed daily on buttocks area. He states that the pain in that area improved but her's pain moved up a little bit. No weakness in the legs No problems with bowel or bladder No previous problems with low back pain. No history of back surgery  Interpreter in room.  MRI of the lumbar spine reviewed, images pulled up. No significant abnormalities X-ray of lumbar spine reviewed, Partially sacralized left L5 transverse process, no significant spondylosis  Patient tried physical therapy had one visit based on insurance, TENS unit placed no other treatments denies having received any exercises Pain Inventory Average Pain 8 Pain Right Now 5 My pain is burning  In the last 24 hours, has pain interfered with the following? General activity 2 Relation with others 3 Enjoyment of life 3 What TIME of day is your pain at its worst? morning daytime and evening Sleep (in general) Fair  Pain is worse with: walking, bending, standing and some activites Pain improves with: rest and medication Relief from Meds: 3  Mobility how many minutes can you walk? 30-45 ability to climb steps?  no do you drive?  no  Function not employed: date last employed 2010 in his previous country I need assistance with the following:  household duties  Neuro/Psych No problems in this area  Prior Studies CT/MRI  Physicians involved in your care Any changes since last visit?  no Primary care Funches, Joslyn   Family History  Problem Relation Age of Onset  . Cancer Mother     stomach   . Heart disease  Neg Hx   . Hypertension Neg Hx   . Diabetes Neg Hx    History   Social History  . Marital Status: Married    Spouse Name: N/A  . Number of Children: 4  . Years of Education: N/A   Occupational History  . Previous Accountant      [place]    Social History Main Topics  . Smoking status: Current Every Day Smoker -- 1.00 packs/day for 25 years    Types: Cigarettes  . Smokeless tobacco: Never Used  . Alcohol Use: Yes     Comment: social  . Drug Use: No  . Sexual Activity: No   Other Topics Concern  . None   Social History Narrative   Form [place]   Moved to [place] in 01/2014    Lives in [place] alone   Wife and 4 children in [place].    History reviewed. No pertinent past surgical history. Past Medical History  Diagnosis Date  . Chronic low back pain 2010    BP 137/80 mmHg  Pulse 102  Resp 16  SpO2 99%  Opioid Risk Score: 0 Fall Risk Score: Low Fall Risk (0-5 points)`1  Depression screen PHQ 2/9  Depression screen [facility] 2/9 08/12/2014 08/06/2014 05/16/2014 03/17/2014  Decreased Interest 0 0 0 0  Down, Depressed, Hopeless 0 0 0 0  PHQ - 2 Score 0 0 0 0  Altered sleeping 0 - - -  Tired, decreased energy 1 - - -  Change in appetite 0 - - -  Feeling bad or failure about yourself  0 - - -  Trouble concentrating 0 - - -  Moving slowly or fidgety/restless 0 - - -  Suicidal thoughts 0 - - -  PHQ-9 Score 1 - - -     Review of Systems  Musculoskeletal: Positive for back pain.  All other systems reviewed and are negative.      Objective:   Physical Exam  Constitutional: He is oriented to person, place, and time. He appears well-developed and well-nourished.  HENT:  Head: Normocephalic and atraumatic.  Eyes: Conjunctivae and EOM are normal. Pupils are equal, round, and reactive to light.  Neck: Normal range of motion.  Musculoskeletal:       Right hip: Normal.       Left hip: Normal.       Lumbar back: He exhibits decreased range of motion. He exhibits no tenderness  and no deformity.  Lumbar spine range of motion 50% flexion extension lateral rotation and bending  Patient places his finger on the left PSIS as the most painful area however it is not tender to palpation  Faber's test is negative  Neurological: He is alert and oriented to person, place, and time.  Psychiatric: He has a normal mood and affect.  Nursing note and vitals reviewed.  Motor strength is 5/5 bilateral hip flexor and knee extensor ankle dorsi flexors and plantar flexor Negative straight leg raising         Assessment & Plan:  1. Lumbar pain mainly left-sided, MRI is unremarkable, x-rays show partial sacralization with pseudoarticulation however it is not clear that this is the pain generator as his pain is further caudal in the PSIS area. Differential includes sacroiliac disorder, myofascial pain syndrome, possible pain from pseudoarticulation left L5 transverse process to iliac crest  We'll give Toradol injection 60 mg IM today Patient can continue his Naprosyn 500 twice a day and Flexeril when necessary we'll also add tramadol 50 mg twice a day to when necessary, Do not think stronger narcotic analgesics will be indicated in this situation Instructed patient in lumbar stabilization exercises Return to clinic one month for sacroiliac injection if not much better Consider injection of pseudoarticulation if sacroiliac injection not helpful

## 2014-08-12 NOTE — Patient Instructions (Addendum)
The physical therapy office will call you to make an appointment  He will come back to see me, may need a sacroiliac injection under x-ray guidance if not doing betterBack Exercises These exercises may help you when beginning to rehabilitate your injury. Your symptoms may resolve with or without further involvement from your physician, physical therapist or athletic trainer. While completing these exercises, remember:   Restoring tissue flexibility helps normal motion to return to the joints. This allows healthier, less painful movement and activity.  An effective stretch should be held for at least 30 seconds.  A stretch should never be painful. You should only feel a gentle lengthening or release in the stretched tissue. STRETCH - Extension, Prone on Elbows   Lie on your stomach on the floor, a bed will be too soft. Place your palms about shoulder width apart and at the height of your head.  Place your elbows under your shoulders. If this is too painful, stack pillows under your chest.  Allow your body to relax so that your hips drop lower and make contact more completely with the floor.  Hold this position for __________ seconds.  Slowly return to lying flat on the floor. Repeat __________ times. Complete this exercise __________ times per day.  RANGE OF MOTION - Extension, Prone Press Ups   Lie on your stomach on the floor, a bed will be too soft. Place your palms about shoulder width apart and at the height of your head.  Keeping your back as relaxed as possible, slowly straighten your elbows while keeping your hips on the floor. You may adjust the placement of your hands to maximize your comfort. As you gain motion, your hands will come more underneath your shoulders.  Hold this position __________ seconds.  Slowly return to lying flat on the floor. Repeat __________ times. Complete this exercise __________ times per day.  RANGE OF MOTION- Quadruped, Neutral Spine   Assume a  hands and knees position on a firm surface. Keep your hands under your shoulders and your knees under your hips. You may place padding under your knees for comfort.  Drop your head and point your tail bone toward the ground below you. This will round out your low back like an angry cat. Hold this position for __________ seconds.  Slowly lift your head and release your tail bone so that your back sags into a large arch, like an old horse.  Hold this position for __________ seconds.  Repeat this until you feel limber in your low back.  Now, find your "sweet spot." This will be the most comfortable position somewhere between the two previous positions. This is your neutral spine. Once you have found this position, tense your stomach muscles to support your low back.  Hold this position for __________ seconds. Repeat __________ times. Complete this exercise __________ times per day.  STRETCH - Flexion, Single Knee to Chest   Lie on a firm bed or floor with both legs extended in front of you.  Keeping one leg in contact with the floor, bring your opposite knee to your chest. Hold your leg in place by either grabbing behind your thigh or at your knee.  Pull until you feel a gentle stretch in your low back. Hold __________ seconds.  Slowly release your grasp and repeat the exercise with the opposite side. Repeat __________ times. Complete this exercise __________ times per day.  STRETCH - Hamstrings, Standing  Stand or sit and extend your right / left  leg, placing your foot on a chair or foot stool  Keeping a slight arch in your low back and your hips straight forward.  Lead with your chest and lean forward at the waist until you feel a gentle stretch in the back of your right / left knee or thigh. (When done correctly, this exercise requires leaning only a small distance.)  Hold this position for __________ seconds. Repeat __________ times. Complete this stretch __________ times per  day. STRENGTHENING - Deep Abdominals, Pelvic Tilt   Lie on a firm bed or floor. Keeping your legs in front of you, bend your knees so they are both pointed toward the ceiling and your feet are flat on the floor.  Tense your lower abdominal muscles to press your low back into the floor. This motion will rotate your pelvis so that your tail bone is scooping upwards rather than pointing at your feet or into the floor.  With a gentle tension and even breathing, hold this position for __________ seconds. Repeat __________ times. Complete this exercise __________ times per day.  STRENGTHENING - Abdominals, Crunches   Lie on a firm bed or floor. Keeping your legs in front of you, bend your knees so they are both pointed toward the ceiling and your feet are flat on the floor. Cross your arms over your chest.  Slightly tip your chin down without bending your neck.  Tense your abdominals and slowly lift your trunk high enough to just clear your shoulder blades. Lifting higher can put excessive stress on the low back and does not further strengthen your abdominal muscles.  Control your return to the starting position. Repeat __________ times. Complete this exercise __________ times per day.  STRENGTHENING - Quadruped, Opposite UE/LE Lift   Assume a hands and knees position on a firm surface. Keep your hands under your shoulders and your knees under your hips. You may place padding under your knees for comfort.  Find your neutral spine and gently tense your abdominal muscles so that you can maintain this position. Your shoulders and hips should form a rectangle that is parallel with the floor and is not twisted.  Keeping your trunk steady, lift your right hand no higher than your shoulder and then your left leg no higher than your hip. Make sure you are not holding your breath. Hold this position __________ seconds.  Continuing to keep your abdominal muscles tense and your back steady, slowly return  to your starting position. Repeat with the opposite arm and leg. Repeat __________ times. Complete this exercise __________ times per day. Document Released: 02/18/2005 Document Revised: 04/25/2011 Document Reviewed: 05/15/2008 [facility] Patient Information 2015 [place], [place]. This information is not intended to replace advice given to you by your health care provider. Make sure you discuss any questions you have with your health care provider.

## 2014-08-13 ENCOUNTER — Encounter: Payer: Self-pay | Admitting: Cardiology

## 2014-08-13 ENCOUNTER — Ambulatory Visit: Payer: Medicaid Other | Attending: Cardiology | Admitting: Cardiology

## 2014-08-13 VITALS — BP 118/75 | HR 88 | Temp 97.5°F | Resp 18 | Ht 66.0 in | Wt 154.6 lb

## 2014-08-13 DIAGNOSIS — I471 Supraventricular tachycardia: Secondary | ICD-10-CM

## 2014-08-13 DIAGNOSIS — R000 Tachycardia, unspecified: Secondary | ICD-10-CM

## 2014-08-13 NOTE — Progress Notes (Signed)
HPI Nathaniel Robinson is referred today by Dr.Funches for sinus tachycardia on EKG. EKG was normal otherwise.  He's having no symptoms of chest pain or ischemic heart disease symptoms. His back hurts if he walks for long period of time. His only risk factors for coronary disease include age and male sex as well as smoking. There are no lipids on the chart. He is not diabetic and does not have hypertension.  Past Medical History  Diagnosis Date  . Chronic low back pain 2010     Current Outpatient Prescriptions  Medication Sig Dispense Refill  . cyclobenzaprine (FLEXERIL) 10 MG tablet Take 1 tablet (10 mg total) by mouth 3 (three) times daily as needed for muscle spasms. 60 tablet 2  . naproxen (NAPROSYN) 500 MG tablet Take 1 tablet (500 mg total) by mouth 2 (two) times daily as needed for moderate pain (take with food). 60 tablet 2  . nicotine (NICODERM CQ - DOSED IN MG/24 HOURS) 14 mg/24hr patch Place 1 patch (14 mg total) onto the skin daily. 14 patch 0  . nicotine (NICODERM CQ - DOSED IN MG/24 HOURS) 21 mg/24hr patch Place 1 patch (21 mg total) onto the skin daily. 42 patch 0  . nicotine (NICODERM CQ - DOSED IN MG/24 HR) 7 mg/24hr patch Place 1 patch (7 mg total) onto the skin daily. 14 patch 0  . rifampin (RIFADIN) 150 MG capsule Take 300 mg by mouth daily. Prescribed by health department    . traMADol (ULTRAM) 50 MG tablet Take 1 tablet (50 mg total) by mouth every 12 (twelve) hours as needed for moderate pain or severe pain. (Patient not taking: Reported on 08/13/2014) 60 tablet 1   No current facility-administered medications for this visit.    No Known Allergies  Family History  Problem Relation Age of Onset  . Cancer Mother     stomach   . Heart disease Neg Hx   . Hypertension Neg Hx   . Diabetes Neg Hx     History   Social History  . Marital Status: Married    Spouse Name: N/A  . Number of Children: 4  . Years of Education: N/A   Occupational History  . Previous Accountant       [place]    Social History Main Topics  . Smoking status: Former Smoker -- 0.50 packs/day for 25 years    Types: Cigarettes    Quit date: 08/03/2014  . Smokeless tobacco: Never Used  . Alcohol Use: No     Comment: social  . Drug Use: No  . Sexual Activity: No   Other Topics Concern  . Not on file   Social History Narrative   Form [place]   Moved to [place] in 01/2014    Lives in [place] alone   Wife and 4 children in [place].     ROS ALL NEGATIVE EXCEPT THOSE NOTED IN HPI  PE  General Appearance: well developed, well nourished in no acute distress HEENT: symmetrical face, PERRLA,  Neck: no JVD, thyromegaly, or adenopathy, trachea midline Chest: symmetric without deformity Cardiac: PMI non-displaced, RRR, normal S1, S2, no gallop or murmur Lung: clear to ausculation and percussion Vascular: all pulses full without bruits  Extremities: no cyanosis, clubbing or edema, no sign of DVT, no varicosities  Neuro: alert and oriented x 3, non-focal Pysch: normal affect  EKG  BMET    Component Value Date/Time   NA 140 03/17/2014 1017   K 4.6 03/17/2014 1017   CL  109 03/17/2014 1017   CO2 27 03/17/2014 1017   GLUCOSE 86 03/17/2014 1017   BUN 10 03/17/2014 1017   CREATININE 0.68 03/17/2014 1017   CALCIUM 9.0 03/17/2014 1017   GFRNONAA >89 03/17/2014 1017   GFRAA >89 03/17/2014 1017    Lipid Panel  No results found for: CHOL, TRIG, HDL, CHOLHDL, VLDL, LDLCALC  CBC    Component Value Date/Time   WBC 6.3 03/17/2014 1017   RBC 4.85 03/17/2014 1017   HGB 15.7 03/17/2014 1017   HCT 46.3 03/17/2014 1017   PLT 247 03/17/2014 1017   MCV 95.5 03/17/2014 1017   MCH 32.4 03/17/2014 1017   MCHC 33.9 03/17/2014 1017   RDW 14.0 03/17/2014 1017

## 2014-08-13 NOTE — Progress Notes (Signed)
Patient here for exercise stress test, per Dr. Adrian Blackwater.  Patient denies chest pain, tightness, pressure, shortness of breath.  Patient reports lower back pain which he has already been treated for by a different provider.  Patient states pain is worse with exercise and pain medicine helps.  Patient also reports red rash on chest that appeared 5 days ago and does not itch.   Patient states he used to smoke 1/2 pack cigarettes/day but now wears Nicotine patch.  Last cigarette use 10 days ago.   Patient speaks [place], friend in room to interpret and Chaney Born [phone] on phone in room.

## 2014-08-13 NOTE — Assessment & Plan Note (Signed)
He is in normal sinus rhythm today with a heart rate of 88 bpm. EKG was normal except for sinus tachycardia. No obvious reasons for this and no cardiac symptoms. No further cardiac workup.

## 2014-08-27 ENCOUNTER — Encounter: Payer: Self-pay | Admitting: Family Medicine

## 2014-08-27 ENCOUNTER — Ambulatory Visit (HOSPITAL_BASED_OUTPATIENT_CLINIC_OR_DEPARTMENT_OTHER): Payer: Medicaid Other | Admitting: Family Medicine

## 2014-08-27 ENCOUNTER — Ambulatory Visit (HOSPITAL_COMMUNITY)
Admission: RE | Admit: 2014-08-27 | Discharge: 2014-08-27 | Disposition: A | Discharge status: Home / Self Care | Payer: Medicaid Other | Source: Ambulatory Visit | Attending: Family Medicine | Admitting: Family Medicine

## 2014-08-27 VITALS — BP 122/71 | HR 77 | Temp 98.9°F | Resp 16 | Ht 67.0 in | Wt 150.0 lb

## 2014-08-27 DIAGNOSIS — R2230 Localized swelling, mass and lump, unspecified upper limb: Secondary | ICD-10-CM | POA: Insufficient documentation

## 2014-08-27 DIAGNOSIS — I471 Supraventricular tachycardia: Secondary | ICD-10-CM

## 2014-08-27 DIAGNOSIS — M533 Sacrococcygeal disorders, not elsewhere classified: Secondary | ICD-10-CM

## 2014-08-27 DIAGNOSIS — M4328 Fusion of spine, sacral and sacrococcygeal region: Secondary | ICD-10-CM | POA: Diagnosis not present

## 2014-08-27 DIAGNOSIS — R2231 Localized swelling, mass and lump, right upper limb: Secondary | ICD-10-CM | POA: Diagnosis not present

## 2014-08-27 DIAGNOSIS — R000 Tachycardia, unspecified: Secondary | ICD-10-CM

## 2014-08-27 LAB — CBC WITH DIFFERENTIAL/PLATELET
Basophils Absolute: 0.1 10*3/uL (ref 0.0–0.1)
Basophils Relative: 1 % (ref 0–1)
Eosinophils Absolute: 0.1 10*3/uL (ref 0.0–0.7)
Eosinophils Relative: 2 % (ref 0–5)
HCT: 42.9 % (ref 39.0–52.0)
Hemoglobin: 14.6 g/dL (ref 13.0–17.0)
Lymphocytes Relative: 56 % — ABNORMAL HIGH (ref 12–46)
Lymphs Abs: 3.8 10*3/uL (ref 0.7–4.0)
MCH: 32.2 pg (ref 26.0–34.0)
MCHC: 34 g/dL (ref 30.0–36.0)
MCV: 94.7 fL (ref 78.0–100.0)
MPV: 9.2 fL (ref 8.6–12.4)
Monocytes Absolute: 0.6 10*3/uL (ref 0.1–1.0)
Monocytes Relative: 9 % (ref 3–12)
NEUTROS ABS: 2.2 10*3/uL (ref 1.7–7.7)
Neutrophils Relative %: 32 % — ABNORMAL LOW (ref 43–77)
Platelets: 244 10*3/uL (ref 150–400)
RBC: 4.53 MIL/uL (ref 4.22–5.81)
RDW: 13.6 % (ref 11.5–15.5)
WBC: 6.8 10*3/uL (ref 4.0–10.5)

## 2014-08-27 LAB — COMPLETE METABOLIC PANEL WITH GFR
ALT: 15 U/L (ref 0–53)
AST: 19 U/L (ref 0–37)
Albumin: 4 g/dL (ref 3.5–5.2)
Alkaline Phosphatase: 49 U/L (ref 39–117)
BUN: 10 mg/dL (ref 6–23)
CALCIUM: 9 mg/dL (ref 8.4–10.5)
CO2: 26 mEq/L (ref 19–32)
Chloride: 106 mEq/L (ref 96–112)
Creat: 0.53 mg/dL (ref 0.50–1.35)
GFR, Est African American: >89 mL/min
GFR, Est Non African American: >89 mL/min
Glucose, Bld: 104 mg/dL — ABNORMAL HIGH (ref 70–99)
Potassium: 4.6 mEq/L (ref 3.5–5.3)
SODIUM: 143 meq/L (ref 135–145)
Total Bilirubin: 0.5 mg/dL (ref 0.2–1.2)
Total Protein: 6.4 g/dL (ref 6.0–8.3)

## 2014-08-27 NOTE — Patient Instructions (Addendum)
Nathaniel Robinson,  Thank you for coming in today  1. Hip pain: Followed by Dr. Dianna Limbo Plan: Keep appt with Dr. Dianna Limbo for possible sacroiliac injection  2. Sinus tachycardia: resolved  3. Lump R armpit: this may be a lymph node but is more likely a blocked sweat gland Warm compresses  Labs-CBC diff and CMP Chest x-ray   You will be called with results  Plan to f/u in 2-3 weeks for lump in R armpit  Dr. Adrian Blackwater

## 2014-08-27 NOTE — Progress Notes (Signed)
   Subjective:    Patient ID: Nathaniel Robinson, male    DOB: Dec 20, 1955, 59 y.o.   MRN: [id number] CC: f/u sinus tachycardia and chronic L low back pain  HPI 59 yo M with hx of chronic low back pain and sinus tachycardia  1. Sinus tach: resolved. Saw cardiology. Low risk of cardiac disease. Working to quit smoking. No CP or SOB.  2. Low back and L hip pain: persist. No improvement s/p toradol shot and prn tramadol. Is now established with pain management. Planning on L SI joint injection.   3. Bump in R axilla: x 5 days. Non tender. No fever. Having night sweats and trouble sleeping. Does have a non-pruritic erythematous rash on chest. No known sick contacts. Did live with a person from [place] who has a take unknown treatment for an unknown illness acquired in [place]. 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  Respiratory: Negative for cough and shortness of breath.   Cardiovascular: Negative for chest pain, palpitations and leg swelling.  Gastrointestinal: Negative for nausea, vomiting, abdominal pain, diarrhea, constipation and blood in stool.  Musculoskeletal: Positive for myalgias, back pain and arthralgias. Negative for gait problem.      Objective:   Physical Exam BP 122/71 mmHg  Pulse 77  Temp(Src) 98.9 F (37.2 C) (Oral)  Resp 16  Ht 5\' 7"  (1.702 m)  Wt 150 lb (68.04 kg)  BMI 23.49 kg/m2  SpO2 99% General appearance: alert, cooperative and no distress Lungs: clear to auscultation bilaterally Heart: regular rate and rhythm, S1, S2 normal, no murmur, click, rub or gallop Skin:  1 .5 cm erythematous papule in R axilla, non tender, non fluctuant, 2 x 2 cm circular erythematous patches on chest  Lymph nodes: Cervical, supraclavicular, and axillary nodes normal.       Assessment & Plan:

## 2014-08-27 NOTE — Assessment & Plan Note (Signed)
Lump R armpit: this may be a lymph node but is more likely a blocked sweat gland Warm compresses  Labs-CBC diff and CMP Chest x-ray

## 2014-08-27 NOTE — Assessment & Plan Note (Signed)
Resolved

## 2014-08-27 NOTE — Progress Notes (Signed)
Used [employer] [phone]   F/U Cardiology, no cardio problems per pt  No changes  since last visit, still with hip pain

## 2014-08-27 NOTE — Assessment & Plan Note (Signed)
Hip pain: Followed by Dr. Dianna Limbo Plan: Keep appt with Dr. Dianna Limbo for possible sacroiliac injection

## 2014-09-03 ENCOUNTER — Telehealth: Payer: Self-pay | Admitting: *Deleted

## 2014-09-03 ENCOUNTER — Ambulatory Visit: Payer: Medicaid Other

## 2014-09-03 NOTE — Telephone Encounter (Signed)
-----   Message from Boykin Nearing, MD sent at 08/29/2014  8:40 AM EDT ----- Normal CMP and CBC with diff, no evidence of infection based on labs

## 2014-09-03 NOTE — Telephone Encounter (Signed)
Used [employer] [phone]   Pt aware of lab and Xray results Stated quit smocking  Swelling improved

## 2014-09-03 NOTE — Telephone Encounter (Signed)
-----   Message from Boykin Nearing, MD sent at 08/29/2014  8:46 AM EDT ----- X-ray reveals slight changes at the lung base most likely mild inflammation of the airways due to smoking No mass or pneumonia  Continue to quit smoking Will monitor swelling under r arm if no improvement plan for [place]

## 2014-09-08 ENCOUNTER — Ambulatory Visit (HOSPITAL_BASED_OUTPATIENT_CLINIC_OR_DEPARTMENT_OTHER): Payer: Medicaid Other | Admitting: Physical Medicine & Rehabilitation

## 2014-09-08 ENCOUNTER — Encounter: Payer: Medicaid Other | Attending: Physical Medicine & Rehabilitation

## 2014-09-08 ENCOUNTER — Encounter: Payer: Self-pay | Admitting: Physical Medicine & Rehabilitation

## 2014-09-08 VITALS — BP 124/66 | HR 91 | Resp 14

## 2014-09-08 DIAGNOSIS — M4328 Fusion of spine, sacral and sacrococcygeal region: Secondary | ICD-10-CM | POA: Insufficient documentation

## 2014-09-08 DIAGNOSIS — G894 Chronic pain syndrome: Secondary | ICD-10-CM | POA: Insufficient documentation

## 2014-09-08 DIAGNOSIS — Z5181 Encounter for therapeutic drug level monitoring: Secondary | ICD-10-CM | POA: Insufficient documentation

## 2014-09-08 DIAGNOSIS — M533 Sacrococcygeal disorders, not elsewhere classified: Secondary | ICD-10-CM

## 2014-09-08 NOTE — Patient Instructions (Signed)
Sacroiliac injection was performed today. A combination of a naming medicine plus a cortisone medicine was injected. The injection was done under x-ray guidance. This procedure has been performed to help reduce low back and buttocks pain as well as potentially hip pain. The duration of this injection is variable lasting from hours to  Months. It may repeated if needed. 

## 2014-09-08 NOTE — Progress Notes (Signed)

## 2014-09-08 NOTE — Progress Notes (Signed)
  [facility]   Name: Nathaniel Robinson DOB:26-May-1955 MRN: [id number]  Date:09/08/2014  Physician: Alysia Penna, MD    Nurse/CMA: Mancel Parsons  Allergies: No Known Allergies  Consent Signed: Yes.    Is patient diabetic? No.  CBG today? NA  Pregnant: No. LMP: No LMP for male patient. (age 58-55)  Anticoagulants: no Anti-inflammatory: no Antibiotics: no  Procedure: sacroiliac steroid injection  Position: Prone Start Time: 2:22pm  End Time: 2:24pm  Fluoro Time: 11  RN/CMA Rolan Bucco Cristina Ceniceros    Time 1:55pm 2:26pm    BP 124/66 132/71    Pulse 91 85    Respirations 14 14    O2 Sat 98 98    S/S 6 6    Pain Level 6/10 2/10     D/C home with public transportation, patient A & O X 3, D/C instructions reviewed, and sits independently.

## 2014-09-24 ENCOUNTER — Telehealth: Payer: Self-pay | Admitting: *Deleted

## 2014-09-24 ENCOUNTER — Other Ambulatory Visit: Payer: Self-pay | Admitting: Family Medicine

## 2014-09-24 MED ORDER — TRAMADOL HCL 50 MG PO TABS: 50.0000 mg | ORAL_TABLET | Freq: Two times a day (BID) | ORAL | Status: DC | PRN

## 2014-09-24 NOTE — Telephone Encounter (Signed)
LVM Rx at front office  Ready to be pick up

## 2014-09-24 NOTE — Telephone Encounter (Signed)
meds refilled 

## 2014-09-24 NOTE — Telephone Encounter (Signed)
Patient requesting medication refill for traMADol (ULTRAM) 50 MG tablet and cyclobenzaprine (FLEXERIL) 10 MG tablet for pain. Please f/u ,thank you

## 2014-10-06 ENCOUNTER — Ambulatory Visit: Payer: Medicaid Other

## 2014-10-09 ENCOUNTER — Ambulatory Visit: Payer: Medicaid Other | Admitting: Family Medicine

## 2014-10-21 ENCOUNTER — Ambulatory Visit (HOSPITAL_BASED_OUTPATIENT_CLINIC_OR_DEPARTMENT_OTHER): Payer: Medicaid Other | Admitting: Physical Medicine & Rehabilitation

## 2014-10-21 ENCOUNTER — Encounter: Payer: Medicaid Other | Attending: Physical Medicine & Rehabilitation

## 2014-10-21 ENCOUNTER — Encounter: Payer: Self-pay | Admitting: Physical Medicine & Rehabilitation

## 2014-10-21 VITALS — BP 120/71 | HR 81 | Resp 14

## 2014-10-21 DIAGNOSIS — G894 Chronic pain syndrome: Secondary | ICD-10-CM | POA: Insufficient documentation

## 2014-10-21 DIAGNOSIS — S73192A Other sprain of left hip, initial encounter: Secondary | ICD-10-CM | POA: Diagnosis not present

## 2014-10-21 DIAGNOSIS — Z5181 Encounter for therapeutic drug level monitoring: Secondary | ICD-10-CM | POA: Insufficient documentation

## 2014-10-21 DIAGNOSIS — M4328 Fusion of spine, sacral and sacrococcygeal region: Secondary | ICD-10-CM | POA: Insufficient documentation

## 2014-10-21 NOTE — Progress Notes (Signed)
Subjective:    Patient ID: Nathaniel Robinson, male    DOB: [birth year], 59 y.o.   MRN: [id number]  HPI 59 year old [place] male accompanied by his interpreter today. Chief complaint is left-sided low back pain of 5 year duration. Last visit with me included left sacroiliac injection under fluoroscopic guidance which provided a 10 day relief of his left-sided low back pain. His pain scores went from a 5 down to a 1.  Patient is requesting some further explanation in terms of his pain generator. We discussed his MRI report as well as his x-ray findings with the help of the interpreter.  He remains independent with all his self-care and mobility. No lower extremity weakness. No bowel or bladder dysfunction  Pain Inventory Average Pain 8 Pain Right Now 8 My pain is burning  In the last 24 hours, has pain interfered with the following? General activity 1 Relation with others 1 Enjoyment of life 1 What TIME of day is your pain at its worst? all Sleep (in general) Fair  Pain is worse with: walking, bending, sitting, inactivity, standing and some activites Pain improves with: rest and medication Relief from Meds: 0  Mobility walk without assistance how many minutes can you walk? 45 ability to climb steps?  no do you drive?  no transfers alone  Function not employed: date last employed .  Neuro/Psych No problems in this area  Prior Studies Any changes since last visit?  no  Physicians involved in your care Any changes since last visit?  no   Family History  Problem Relation Age of Onset  . Cancer Mother     stomach   . Heart disease Neg Hx   . Hypertension Neg Hx   . Diabetes Neg Hx    Social History   Social History  . Marital Status: Married    Spouse Name: N/A  . Number of Children: 4  . Years of Education: N/A   Occupational History  . Previous Accountant      [place]    Social History Main Topics  . Smoking status: Former Smoker -- 0.50 packs/day for  25 years    Types: Cigarettes    Quit date: 08/03/2014  . Smokeless tobacco: Never Used  . Alcohol Use: No     Comment: social  . Drug Use: No  . Sexual Activity: No   Other Topics Concern  . None   Social History Narrative   Form [place]   Moved to [place] in 01/2014    Lives in [place] alone   Wife and 4 children in [place].    History reviewed. No pertinent past surgical history. Past Medical History  Diagnosis Date  . Chronic low back pain 2010    BP 120/71 mmHg  Pulse 81  Resp 14  SpO2 98%  Opioid Risk Score:   Fall Risk Score:  `1  Depression screen PHQ 2/9  Depression screen [facility] 2/9 08/27/2014 08/13/2014 08/12/2014 08/06/2014 05/16/2014 03/17/2014  Decreased Interest 0 0 0 0 0 0  Down, Depressed, Hopeless 0 0 0 0 0 0  PHQ - 2 Score 0 0 0 0 0 0  Altered sleeping - - 0 - - -  Tired, decreased energy - - 1 - - -  Change in appetite - - 0 - - -  Feeling bad or failure about yourself  - - 0 - - -  Trouble concentrating - - 0 - - -  Moving slowly or fidgety/restless - - 0 - - -  Suicidal thoughts - - 0 - - -  PHQ-9 Score - - 1 - - -     Review of Systems  Gastrointestinal:       Acid reflux  All other systems reviewed and are negative.      Objective:   Physical Exam  Constitutional: He is oriented to person, place, and time. He appears well-developed and well-nourished.  HENT:  Head: Normocephalic and atraumatic.  Right Ear: External ear normal.  Left Ear: External ear normal.  Nose: Nose normal.  Mouth/Throat: Oropharynx is clear and moist.  Eyes: EOM are normal.  Neck: Normal range of motion. Neck supple.  Musculoskeletal:       Left hip: Normal.       Lumbar back: He exhibits decreased range of motion and tenderness. He exhibits no deformity and no spasm.  Neurological: He is alert and oriented to person, place, and time.  Psychiatric: He has a normal mood and affect.  Nursing note and vitals reviewed.  Tenderness to palpation left gluteus medius area as  well as left PSIS area as well as left lumbar paraspinal area       Assessment & Plan:   1. Left-sided axial pain appears multifactorial. 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 In addition he has tenderness over the gluteus medius area and I suspect he has myofascial pain in this area.  Patient also has pseudo-articulation between the elongated transverse process of L5 and the iliac bone.  Trigger Point Injection  Indication: Left gluteus medius Myofascial pain not relieved by medication management and other conservative care.  Informed consent was obtained after describing risk and benefits of the procedure with the patient, this includes bleeding, bruising, infection and medication side effects.  The patient wishes to proceed and has given written consent.  The patient was placed in a Right lateral decubitus position.  The Left gluteus medius area was marked and prepped with Betadine.  It was entered with a 25-gauge 1-1/2 inch needle and 1 mL of 1% lidocaine was injected into each of 1 trigger points, after negative draw back for blood. Positive twitch sign was obtained The patient tolerated the procedure well.  Post procedure instructions were given.

## 2014-10-21 NOTE — Patient Instructions (Addendum)
Icy Hot cream  Ben Gay cream Appley to back 3 times a day  Use heating pad 20 minutes 3 times a day

## 2014-11-18 ENCOUNTER — Ambulatory Visit: Payer: Medicaid Other | Admitting: Physical Medicine & Rehabilitation

## 2014-11-28 ENCOUNTER — Ambulatory Visit: Payer: Medicaid Other | Attending: Family Medicine | Admitting: Family Medicine

## 2014-11-28 ENCOUNTER — Encounter: Payer: Self-pay | Admitting: Family Medicine

## 2014-11-28 VITALS — BP 110/66 | HR 88 | Temp 97.9°F | Resp 16 | Ht 66.0 in | Wt 144.0 lb

## 2014-11-28 DIAGNOSIS — Z0000 Encounter for general adult medical examination without abnormal findings: Secondary | ICD-10-CM

## 2014-11-28 DIAGNOSIS — M545 Low back pain: Secondary | ICD-10-CM | POA: Insufficient documentation

## 2014-11-28 DIAGNOSIS — R7611 Nonspecific reaction to tuberculin skin test without active tuberculosis: Secondary | ICD-10-CM | POA: Diagnosis not present

## 2014-11-28 DIAGNOSIS — Z87891 Personal history of nicotine dependence: Secondary | ICD-10-CM | POA: Insufficient documentation

## 2014-11-28 DIAGNOSIS — G8929 Other chronic pain: Secondary | ICD-10-CM | POA: Diagnosis not present

## 2014-11-28 MED ORDER — TRAMADOL HCL 50 MG PO TABS: 50.0000 mg | ORAL_TABLET | Freq: Two times a day (BID) | ORAL | Status: DC | PRN

## 2014-11-28 NOTE — Progress Notes (Signed)
Used [employer] [phone]  Complaining of back pain. Pain started about 6 years ago and since not taking medication started again  Was going to pain management for INJ. Next INJ in two weeks  Pain scale #7 No tobacco user x 4 month

## 2014-11-28 NOTE — Progress Notes (Signed)
Subjective:  Patient ID: Nathaniel Robinson, male    DOB: 06-26-1955  Age: 59 y.o. MRN: [id number]  CC: Back Pain   HPI Nathaniel Robinson presents for   1. Back pain: chronic low back pain. Patient established with pain management. Injections help relieve pain for about 2 weeks post injection. No weakness in legs. No fever or chills. Out of tramadol.  2. 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 Has letter for health department. Needs appt at HD to TB questionnaire form.   Social History  Substance Use Topics  . Smoking status: Former Smoker -- 0.50 packs/day for 25 years    Types: Cigarettes    Quit date: 08/03/2014  . Smokeless tobacco: Never Used  . Alcohol Use: No     Comment: social   Outpatient Prescriptions Prior to Visit  Medication Sig Dispense Refill  . cyclobenzaprine (FLEXERIL) 10 MG tablet TAKE 1 TABLET BY MOUTH 3 TIMES DAILY AS NEEDED FOR MUSCLE SPASMS. (Patient not taking: Reported on 10/21/2014) 60 tablet 2  . naproxen (NAPROSYN) 500 MG tablet TAKE 1 TABLET BY MOUTH 2 TIMES DAILY AS NEEDED FOR MODERATE PAIN (TAKE WITH FOOD). (Patient not taking: Reported on 10/21/2014) 60 tablet 2  . nicotine (NICODERM CQ - DOSED IN MG/24 HOURS) 14 mg/24hr patch Place 1 patch (14 mg total) onto the skin daily. 14 patch 0  . nicotine (NICODERM CQ - DOSED IN MG/24 HOURS) 21 mg/24hr patch Place 1 patch (21 mg total) onto the skin daily. 42 patch 0  . nicotine (NICODERM CQ - DOSED IN MG/24 HR) 7 mg/24hr patch Place 1 patch (7 mg total) onto the skin daily. 14 patch 0  . rifampin (RIFADIN) 150 MG capsule Take 300 mg by mouth daily. Prescribed by health department    . traMADol (ULTRAM) 50 MG tablet Take 1 tablet (50 mg total) by mouth every 12 (twelve) hours as needed for moderate pain or severe pain. (Patient not taking: Reported on 10/21/2014) 60 tablet 1   No facility-administered medications prior to visit.    ROS Review of Systems  Constitutional: Negative for fever, chills, fatigue and  unexpected weight change.  Eyes: Negative for visual disturbance.  Respiratory: Negative for cough and shortness of breath.   Cardiovascular: Negative for chest pain, palpitations and leg swelling.  Gastrointestinal: Negative for nausea, vomiting, abdominal pain, diarrhea, constipation and blood in stool.  Endocrine: Negative for polydipsia, polyphagia and polyuria.  Musculoskeletal: Positive for back pain. Negative for myalgias, arthralgias, gait problem and neck pain.  Skin: Negative for rash.  Allergic/Immunologic: Negative for immunocompromised state.  Hematological: Negative for adenopathy. Does not bruise/bleed easily.  Psychiatric/Behavioral: Negative for suicidal ideas, sleep disturbance and dysphoric mood. The patient is not nervous/anxious.     Objective:  BP 110/66 mmHg  Pulse 88  Temp(Src) 97.9 F (36.6 C) (Oral)  Resp 16  Ht 5\' 6"  (1.676 m)  Wt 144 lb (65.318 kg)  BMI 23.25 kg/m2  SpO2 99%  BP/Weight 11/28/2014 10/21/2014 6/96/2952  Systolic BP 841 324 401  Diastolic BP 66 71 66  Wt. (Lbs) 144 - -  BMI 23.25 - -    Physical Exam  Constitutional: He appears well-developed and well-nourished. No distress.  HENT:  Head: Normocephalic and atraumatic.  Neck: Normal range of motion. Neck supple.  Cardiovascular: Normal rate, regular rhythm, normal heart sounds and intact distal pulses.   Pulmonary/Chest: Effort normal and breath sounds normal.  Musculoskeletal: He exhibits tenderness. He exhibits no edema.  Back:  Neurological: He is alert.  Skin: Skin is warm and dry. No rash noted. No erythema.  Psychiatric: He has a normal mood and affect.     Assessment & Plan:   Problem List Items Addressed This Visit    Chronic low back pain - Primary (Chronic)   Relevant Medications   traMADol (ULTRAM) 50 MG tablet   Positive TB test      No orders of the defined types were placed in this encounter.    Follow-up: No Follow-up on file.   Boykin Nearing  MD

## 2014-11-28 NOTE — Patient Instructions (Signed)
Waller was seen today for back pain.  Diagnoses and all orders for this visit:  Chronic low back pain -     traMADol (ULTRAM) 50 MG tablet; Take 1 tablet (50 mg total) by mouth every 12 (twelve) hours as needed for moderate pain or severe pain.  Positive TB test  go to health department for f/u tuberculosis questionnaire.  F/u in 3 months with me  Dr. Amado Coe

## 2014-12-08 ENCOUNTER — Encounter: Payer: Medicaid Other | Attending: Physical Medicine & Rehabilitation

## 2014-12-08 ENCOUNTER — Ambulatory Visit (HOSPITAL_BASED_OUTPATIENT_CLINIC_OR_DEPARTMENT_OTHER): Payer: Medicaid Other | Admitting: Physical Medicine & Rehabilitation

## 2014-12-08 ENCOUNTER — Encounter: Payer: Self-pay | Admitting: Physical Medicine & Rehabilitation

## 2014-12-08 VITALS — BP 123/60 | HR 91 | Resp 14

## 2014-12-08 DIAGNOSIS — M4328 Fusion of spine, sacral and sacrococcygeal region: Secondary | ICD-10-CM | POA: Insufficient documentation

## 2014-12-08 DIAGNOSIS — G894 Chronic pain syndrome: Secondary | ICD-10-CM | POA: Insufficient documentation

## 2014-12-08 DIAGNOSIS — M545 Low back pain: Secondary | ICD-10-CM | POA: Diagnosis not present

## 2014-12-08 DIAGNOSIS — Z5181 Encounter for therapeutic drug level monitoring: Secondary | ICD-10-CM | POA: Insufficient documentation

## 2014-12-08 DIAGNOSIS — M5459 Other low back pain: Secondary | ICD-10-CM

## 2014-12-08 NOTE — Progress Notes (Signed)
Left Lumbar L3, L4  medial branch blocks and L 5 dorsal ramus injection under fluoroscopic guidance   Indication: Left Lumbar pain which is not relieved by medication management or other conservative care and interfering with self-care and mobility.  Informed consent was obtained after describing risks and benefits of the procedure with the patient, this includes bleeding, bruising, infection, paralysis and medication side effects.  The patient wishes to proceed and has given written consent.  The patient was placed in a prone position.  The lumbar area was marked and prepped with Betadine.  One mL of 1% lidocaine was injected into each of 3 areas into the skin and subcutaneous tissue.  Then a 22-gauge 3.5 inch spinal needle was inserted targeting the junction of the left S1 superior articular process and sacral ala junction.  Needle was advanced under fluoroscopic guidance.  Bone contact was made.  Omnipaque 180 was injected x 0.5 mL demonstrating no intravascular uptake.  Then a solution containing one mL of 4 mg per mL dexamethasone and 3 mL of 2% MPF lidocaine was injected x 0.5 mL.  Then the left L5 superior articular process in transverse process junction was targeted.  Bone contact was made.  Omnipaque 180 was injected x 0.5 mL demonstrating no intravascular uptake.  Then a solution containing one mL of 4 mg per mL dexamethasone and 3 mL of 2% MPF lidocaine was injected x 0.5 mL.  Then the left L4 superior articular process in transverse process junction was targeted.  Bone contact was made.  Omnipaque 180 was injected x 0.5 mL demonstrating no intravascular uptake.  Then a solution containing one mL of 4 mg per mL dexamethasone and 3 mL of 2% MPF lidocaine was injected x 0.5 mL.  Patient tolerated procedure well.  Post procedure instructions were given.

## 2014-12-08 NOTE — Progress Notes (Signed)
  [facility]   Name: Nathaniel Robinson DOB:1956-01-14 MRN: [id number]  Date:12/08/2014  Physician: Alysia Penna, MD    Nurse/CMA: Mancel Parsons  Allergies: No Known Allergies  Consent Signed: Yes.    Is patient diabetic? No.  CBG today?   Pregnant: No. LMP: No LMP for male patient. (age 58-55)  Anticoagulants: no Anti-inflammatory: no Antibiotics: no  Procedure: left medial branch block  Position: Prone Start Time: 3:32pm           End Time: 3:37pm  Fluoro Time: 17  RN/CMA Rolan Bucco Bettylee Feig    Time 2:55pm     BP 123/60 112/60    Pulse 91 81    Respirations 14 14    O2 Sat 98 98    S/S 6 6    Pain Level 7/10 5/10     D/C home with [name], patient A & O X 3, D/C instructions reviewed, and sits independently.

## 2014-12-08 NOTE — Patient Instructions (Addendum)
Lumbar medial branch blocks were performed. This is to help diagnose the cause of the low back pain. It is important that you keep track of your pain for the first day or 2 after injection. This injection can give you temporary relief that lasts for hours or up to several months. There is no way to predict duration of pain relief.  Please try to compare your pain after injection to for the injection.  If this injection gives you  temporary relief there may be another longer-lasting procedure that may be beneficial call radiofrequency ablation   You may walk 30-45 minutes per day starting tomorrow

## 2014-12-26 ENCOUNTER — Ambulatory Visit (HOSPITAL_BASED_OUTPATIENT_CLINIC_OR_DEPARTMENT_OTHER): Payer: Medicaid Other | Admitting: Physical Medicine & Rehabilitation

## 2014-12-26 ENCOUNTER — Encounter: Payer: Medicaid Other | Attending: Physical Medicine & Rehabilitation

## 2014-12-26 ENCOUNTER — Encounter: Payer: Self-pay | Admitting: Physical Medicine & Rehabilitation

## 2014-12-26 VITALS — BP 115/60 | HR 90 | Resp 14

## 2014-12-26 DIAGNOSIS — M4328 Fusion of spine, sacral and sacrococcygeal region: Secondary | ICD-10-CM | POA: Insufficient documentation

## 2014-12-26 DIAGNOSIS — G894 Chronic pain syndrome: Secondary | ICD-10-CM | POA: Insufficient documentation

## 2014-12-26 DIAGNOSIS — Z5181 Encounter for therapeutic drug level monitoring: Secondary | ICD-10-CM | POA: Insufficient documentation

## 2014-12-26 DIAGNOSIS — M533 Sacrococcygeal disorders, not elsewhere classified: Secondary | ICD-10-CM

## 2014-12-26 NOTE — Progress Notes (Signed)

## 2014-12-26 NOTE — Progress Notes (Signed)
  PROCEDURE RECORD [facility]   Name: Nathaniel Robinson DOB:[birth year] MRN: [id number]  Date:12/26/2014  Physician: Alysia Penna, MD    Nurse/CMA: Mancel Parsons  Allergies: No Known Allergies  Consent Signed: Yes.    Is patient diabetic? No.  CBG today? na  Pregnant: No. LMP: No LMP for male patient. (age 58-55)  Anticoagulants: no Anti-inflammatory: no Antibiotics: no  Procedure: left sacroiliac steroid injection  Position: Prone Start Time: 11:19am  End Time: 11:24am  Fluoro Time: 18  RN/CMA Rolan Bucco Perlie Scheuring    Time 11:00am 11:28am    BP 115/60 119/59    Pulse 90 90    Respirations 14 14    O2 Sat 100 100    S/S 6 6    Pain Level 7/10 5/10     D/C home with public transportation, patient A & O X 3, D/C instructions reviewed, and sits independently.

## 2015-01-06 ENCOUNTER — Ambulatory Visit: Payer: Medicaid Other

## 2015-01-06 ENCOUNTER — Ambulatory Visit: Payer: Medicaid Other | Admitting: Physical Medicine & Rehabilitation

## 2015-02-05 ENCOUNTER — Ambulatory Visit: Payer: Medicaid Other | Admitting: Family Medicine

## 2015-02-10 ENCOUNTER — Ambulatory Visit: Payer: Medicaid Other | Admitting: Physical Medicine & Rehabilitation

## 2015-03-06 ENCOUNTER — Ambulatory Visit: Payer: Self-pay | Attending: Family Medicine | Admitting: Family Medicine

## 2015-03-06 ENCOUNTER — Other Ambulatory Visit: Payer: Self-pay | Admitting: Family Medicine

## 2015-03-06 ENCOUNTER — Encounter: Payer: Self-pay | Admitting: Family Medicine

## 2015-03-06 ENCOUNTER — Telehealth: Payer: Self-pay | Admitting: Family Medicine

## 2015-03-06 VITALS — BP 132/78 | HR 108 | Temp 98.5°F | Resp 16 | Ht 66.0 in | Wt 146.0 lb

## 2015-03-06 DIAGNOSIS — F17200 Nicotine dependence, unspecified, uncomplicated: Secondary | ICD-10-CM

## 2015-03-06 DIAGNOSIS — F17210 Nicotine dependence, cigarettes, uncomplicated: Secondary | ICD-10-CM | POA: Insufficient documentation

## 2015-03-06 DIAGNOSIS — M545 Low back pain, unspecified: Secondary | ICD-10-CM

## 2015-03-06 DIAGNOSIS — Z0000 Encounter for general adult medical examination without abnormal findings: Secondary | ICD-10-CM

## 2015-03-06 DIAGNOSIS — Z720 Tobacco use: Secondary | ICD-10-CM

## 2015-03-06 DIAGNOSIS — G8929 Other chronic pain: Secondary | ICD-10-CM | POA: Insufficient documentation

## 2015-03-06 MED ORDER — TRAMADOL HCL 50 MG PO TABS: 100.0000 mg | ORAL_TABLET | Freq: Two times a day (BID) | ORAL | Status: DC | PRN

## 2015-03-06 MED FILL — traMADol HCL 50 MG TABS: 50 | 30 days supply | Qty: 120 | Fill #0

## 2015-03-06 NOTE — Patient Instructions (Addendum)
Nathaniel Robinson was seen today for back pain.  Diagnoses and all orders for this visit:  Chronic low back pain -     traMADol (ULTRAM) 50 MG tablet; Take 2 tablets (100 mg total) by mouth every 12 (twelve) hours as needed for moderate pain or severe pain.  Healthcare maintenance -     Hepatitis C antibody, reflex -     Ambulatory referral to Gastroenterology   Smoking cessation support: smoking cessation hotline: 1-800-QUIT-NOW.  Smoking cessation classes are available through [facility]. Call [phone] or visit our website at [email or web address]   F/u in 6 weeks for chronic low back pain   Dr. Adrian Blackwater

## 2015-03-06 NOTE — Assessment & Plan Note (Signed)
A: smoker, desires to quit P: Patches recommended Quit line information provided

## 2015-03-06 NOTE — Telephone Encounter (Signed)
Error

## 2015-03-06 NOTE — Progress Notes (Signed)
Subjective:  Patient ID: Nathaniel Robinson, male    DOB: Feb 09, 1956  Age: 60 y.o. MRN: [id number]  CC: Back Pain Tigrinian interpreter used ID # [id number]   HPI Nathaniel Robinson presents for   1. Back pain: chronic low back pain. Patient established with pain management.  He has had two injections. Injections help for 3 weeks  No weakness in legs. No fever or chills. Requesting increase dose of tramadol. Pain interferes with work. He has not worked for past 6 years. He is at risk of losing his home due to lack of income. He is planning to apply for disability.   2. Smoking; he back smoking 10-15 cigs per day. He would like to quit. He is started smoking due to stress regarding lack of income.   Social History  Substance Use Topics  . Smoking status: Former Smoker -- 0.50 packs/day for 25 years    Types: Cigarettes    Quit date: 08/03/2014  . Smokeless tobacco: Never Used  . Alcohol Use: No     Comment: social   Outpatient Prescriptions Prior to Visit  Medication Sig Dispense Refill  . cyclobenzaprine (FLEXERIL) 10 MG tablet TAKE 1 TABLET BY MOUTH 3 TIMES DAILY AS NEEDED FOR MUSCLE SPASMS. 60 tablet 2  . naproxen (NAPROSYN) 500 MG tablet TAKE 1 TABLET BY MOUTH 2 TIMES DAILY AS NEEDED FOR MODERATE PAIN (TAKE WITH FOOD). 60 tablet 2  . rifampin (RIFADIN) 150 MG capsule Take 300 mg by mouth daily. Prescribed by health department    . traMADol (ULTRAM) 50 MG tablet Take 1 tablet (50 mg total) by mouth every 12 (twelve) hours as needed for moderate pain or severe pain. 60 tablet 2   No facility-administered medications prior to visit.    ROS Review of Systems  Constitutional: Negative for fever, chills, fatigue and unexpected weight change.  Eyes: Negative for visual disturbance.  Respiratory: Negative for cough and shortness of breath.   Cardiovascular: Negative for chest pain, palpitations and leg swelling.  Gastrointestinal: Negative for nausea, vomiting, abdominal pain, diarrhea,  constipation and blood in stool.  Endocrine: Negative for polydipsia, polyphagia and polyuria.  Musculoskeletal: Positive for back pain. Negative for myalgias, arthralgias, gait problem and neck pain.  Skin: Negative for rash.  Allergic/Immunologic: Negative for immunocompromised state.  Hematological: Negative for adenopathy. Does not bruise/bleed easily.  Psychiatric/Behavioral: Negative for suicidal ideas, sleep disturbance and dysphoric mood. The patient is not nervous/anxious.     Objective:  BP 132/78 mmHg  Pulse 108  Temp(Src) 98.5 F (36.9 C) (Oral)  Resp 16  Ht 5\' 6"  (1.676 m)  Wt 146 lb (66.225 kg)  BMI 23.58 kg/m2  SpO2 97%  BP/Weight 03/06/2015 12/26/2014 [id number]  Systolic BP [id number]  Diastolic BP 78 60 60  Wt. (Lbs) 146 - -  BMI 23.58 - -    Physical Exam  Constitutional: He appears well-developed and well-nourished. No distress.  HENT:  Head: Normocephalic and atraumatic.  Neck: Normal range of motion. Neck supple.  Cardiovascular: Normal rate, regular rhythm, normal heart sounds and intact distal pulses.   Pulmonary/Chest: Effort normal and breath sounds normal.  Musculoskeletal: He exhibits tenderness. He exhibits no edema.       Back:  Neurological: He is alert.  Skin: Skin is warm and dry. No rash noted. No erythema.  Psychiatric: He has a normal mood and affect.     Assessment & Plan:   Problem List Items Addressed This Visit    Current smoker (  Chronic)    A: smoker, desires to quit P: Patches recommended Quit line information provided       Chronic low back pain - Primary (Chronic)    A: chronic pain, poorly controlled P: Tramadol refilled, dose increased Letter of medical disability provided       Relevant Medications   traMADol (ULTRAM) 50 MG tablet    Other Visit Diagnoses    Healthcare maintenance        Relevant Orders    Hepatitis C antibody, reflex    Ambulatory referral to Gastroenterology       No orders of the  defined types were placed in this encounter.    Follow-up: No Follow-up on file.   Boykin Nearing MD

## 2015-03-06 NOTE — Assessment & Plan Note (Signed)
A: chronic pain, poorly controlled P: Tramadol refilled, dose increased Letter of medical disability provided

## 2015-03-06 NOTE — Progress Notes (Signed)
F/U back pain Requesting increase on pain medication  Pain scale #7 Tobacco user 10 cigarette per day

## 2015-03-07 LAB — HEPATITIS C ANTIBODY: HCV AB: NEGATIVE

## 2015-03-11 ENCOUNTER — Telehealth: Payer: Self-pay | Admitting: *Deleted

## 2015-03-11 NOTE — Telephone Encounter (Signed)
-----   Message from Boykin Nearing, MD sent at 03/07/2015  5:20 PM EST ----- Screening hep C negative

## 2015-03-11 NOTE — Telephone Encounter (Signed)
Date of birth verified by pt Negative Hp C results given  Pt verbalized understanding

## 2015-03-16 ENCOUNTER — Encounter: Payer: Self-pay | Attending: Physical Medicine & Rehabilitation

## 2015-03-16 ENCOUNTER — Ambulatory Visit (HOSPITAL_BASED_OUTPATIENT_CLINIC_OR_DEPARTMENT_OTHER): Payer: Self-pay | Admitting: Physical Medicine & Rehabilitation

## 2015-03-16 ENCOUNTER — Encounter: Payer: Self-pay | Admitting: Physical Medicine & Rehabilitation

## 2015-03-16 VITALS — BP 132/70 | HR 84 | Resp 14

## 2015-03-16 DIAGNOSIS — G894 Chronic pain syndrome: Secondary | ICD-10-CM | POA: Insufficient documentation

## 2015-03-16 DIAGNOSIS — Z5181 Encounter for therapeutic drug level monitoring: Secondary | ICD-10-CM | POA: Insufficient documentation

## 2015-03-16 DIAGNOSIS — M533 Sacrococcygeal disorders, not elsewhere classified: Secondary | ICD-10-CM

## 2015-03-16 DIAGNOSIS — M4328 Fusion of spine, sacral and sacrococcygeal region: Secondary | ICD-10-CM | POA: Insufficient documentation

## 2015-03-16 NOTE — Progress Notes (Signed)
Left sacroiliac injection under fluoroscopic guidance  Indication: Left Low back and buttocks pain not relieved by medication management and other conservative care.  Informed consent was obtained after describing risks and benefits of the procedure with the patient, this includes bleeding, bruising, infection, paralysis and medication side effects. The patient wishes to proceed and has given written consent. The patient was placed in a prone position. The lumbar and sacral area was marked and prepped with Betadine. A 25-gauge 1-1/2 inch needle was inserted into the skin and subcutaneous tissue and 1 mL of 1% lidocaine was injected. Then a 25-gauge 3 inch spinal needle was inserted under fluoroscopic guidance into the left sacroiliac joint. AP and lateral images were utilized. Omnipaque 180x0.5 mL under live fluoroscopy demonstrated no intravascular uptake. Then a solution containing one ML of 6 mg per mLbetamethasone and 2 ML of .25% bupivacaine MPF was injected x1.5 mL. Patient tolerated the procedure well. Post procedure instructions were given. Please see post procedure form.

## 2015-03-16 NOTE — Progress Notes (Signed)
  [facility]   Name: Nathaniel Robinson DOB:[birth year] MRN: [id number]  Date:03/16/2015  Physician: Alysia Penna, MD    Nurse/CMA:Shumaker RN  Allergies: No Known Allergies  Consent Signed: Yes.    Is patient diabetic? No.  CBG today? N/A  Pregnant: No. LMP: No LMP for male patient. (age 59-55)  Anticoagulants: no Anti-inflammatory: no Antibiotics: no  Procedure: Left Sacroiliac Steroid Injection Position: Prone Start Time: 10:36 End Time: 10:39 Fluoro Time: 12  RN/CMA [employer] RN    Time 1024 10:42    BP 132/70 124/77    Pulse 84 100    Respirations 14 14    O2 Sat 100 97    S/S 6 6    Pain Level 7/10      D/C home with public transportation, patient A & O X 3, D/C instructions reviewed, and sits independently.

## 2015-03-16 NOTE — Patient Instructions (Addendum)
Sacroiliac injection was performed today. A combination of a naming medicine plus a cortisone medicine was injected. The injection was done under x-ray guidance. This procedure has been performed to help reduce low back and buttocks pain as well as potentially hip pain. The duration of this injection is variable lasting from hours to  Months. It may repeated if needed.  Your primary doctor will prescribe the pain medicines. I will be performing the injections.

## 2015-03-30 ENCOUNTER — Telehealth: Payer: Self-pay

## 2015-03-30 NOTE — Telephone Encounter (Signed)
Pt was referred for colonoscopy. He does not understand what it is exactly. I tried to explain to him, but he said no one had ever explained it to him. He speaks some English but it is noted in his chart that he needs an interpreter. He wrote down his office visit for 04/13/2015 at 2:00 pm with Laban Emperor, NP.  He has Psychologist, forensic Aid until June 2017.

## 2015-04-03 MED FILL — traMADol HCL 50 MG TABS: 50 | 30 days supply | Qty: 120 | Fill #1

## 2015-04-13 ENCOUNTER — Ambulatory Visit: Payer: Self-pay | Admitting: Gastroenterology

## 2015-04-28 ENCOUNTER — Encounter (INDEPENDENT_AMBULATORY_CARE_PROVIDER_SITE_OTHER): Payer: Self-pay

## 2015-04-28 ENCOUNTER — Ambulatory Visit (INDEPENDENT_AMBULATORY_CARE_PROVIDER_SITE_OTHER): Payer: Self-pay | Admitting: Gastroenterology

## 2015-04-28 ENCOUNTER — Other Ambulatory Visit: Payer: Self-pay

## 2015-04-28 ENCOUNTER — Encounter: Payer: Self-pay | Admitting: Gastroenterology

## 2015-04-28 VITALS — BP 130/81 | HR 79 | Temp 97.0°F | Ht 65.0 in | Wt 136.6 lb

## 2015-04-28 DIAGNOSIS — Z1211 Encounter for screening for malignant neoplasm of colon: Secondary | ICD-10-CM

## 2015-04-28 NOTE — Patient Instructions (Signed)
We have scheduled you for a colonoscopy with Dr. Oneida Alar.  It was good to meet you!

## 2015-04-28 NOTE — Assessment & Plan Note (Signed)
60 year old very pleasant male with need for initial screening colonoscopy. He has no concerning lower or upper GI symptoms. Although he is from [place], his English is quite good and I was able to discuss risks and benefits without any difficulty. All questions were answered.   Proceed with TCS with Dr. Oneida Alar in near future: the risks, benefits, and alternatives have been discussed with the patient in detail. The patient states understanding and desires to proceed. Phenergan 12.5 mg IV on call due to chronic tramadol

## 2015-04-28 NOTE — Progress Notes (Signed)
Primary Care Physician:  Minerva Ends, MD Primary Gastroenterologist:  Dr. Oneida Alar   Chief Complaint  Patient presents with  . set up TCS    HPI:   Nathaniel Robinson is a 60 y.o. male presenting today at the request of his PCP for a screening colonoscopy. He has previously lived in [place], working as an Optometrist. His wife and children are still overseas. He has been here since 2015. His English is fairly good, and we were able to discuss his history and upcoming colonoscopy without any language barriers.   No rectal bleeding. Sometimes constipated. BM about every 2 days. Good appetite. No nausea or vomiting. No dysphagia, no reflux. Weight remains stable. His only complaint is chronic back pain.   Past Medical History  Diagnosis Date  . Chronic low back pain 2010     Past Surgical History  Procedure Laterality Date  . None      Current Outpatient Prescriptions  Medication Sig Dispense Refill  . traMADol (ULTRAM) 50 MG tablet Take 2 tablets (100 mg total) by mouth every 12 (twelve) hours as needed for moderate pain or severe pain. 120 tablet 2   No current facility-administered medications for this visit.    Allergies as of 04/28/2015  . (No Known Allergies)    Family History  Problem Relation Age of Onset  . Cancer Mother     "cancer where they put baby", from description sounds uterine   . Heart disease Neg Hx   . Hypertension Neg Hx   . Diabetes Neg Hx   . Colon cancer Neg Hx     Social History   Social History  . Marital Status: Married    Spouse Name: N/A  . Number of Children: 4  . Years of Education: N/A   Occupational History  . Previous Accountant      [place]    Social History Main Topics  . Smoking status: Current Every Day Smoker -- 0.75 packs/day for 25 years    Types: Cigarettes  . Smokeless tobacco: Never Used     Comment: 10 cigarettes a day   . Alcohol Use: No     Comment: social   . Drug Use: No  . Sexual Activity: No    Other Topics Concern  . Not on file   Social History Narrative   Form [place]   Moved to [place] in 01/2014    Lives in [place] alone   Wife and 4 children in [place].     Review of Systems: Negative unless noted in HPI.   Physical Exam: BP 130/81 mmHg  Pulse 79  Temp(Src) 97 F (36.1 C)  Ht 5\' 5"  (1.651 m)  Wt 136 lb 9.6 oz (61.961 kg)  BMI 22.73 kg/m2 General:   Alert and oriented. Pleasant and cooperative. Well-nourished and well-developed.  Head:  Normocephalic and atraumatic. Eyes:  Without icterus, sclera clear and conjunctiva pink.  Ears:  Normal auditory acuity. Nose:  No deformity, discharge,  or lesions. Mouth:  No deformity or lesions, oral mucosa pink.  Lungs:  Clear to auscultation bilaterally. No wheezes, rales, or rhonchi. No distress.  Heart:  S1, S2 present without murmurs appreciated.  Abdomen:  +BS, soft, non-tender and non-distended. No HSM noted. No guarding or rebound. No masses appreciated.  Rectal:  Deferred  Msk:  Symmetrical without gross deformities. Normal posture. Extremities:  Without edema. Neurologic:  Alert and  oriented x4;  grossly normal neurologically. Psych:  Alert and cooperative.  Normal mood and affect.   Lab Results  Component Value Date   WBC 6.8 08/27/2014   HGB 14.6 08/27/2014   HCT 42.9 08/27/2014   MCV 94.7 08/27/2014   PLT 244 08/27/2014   Lab Results  Component Value Date   ALT 15 08/27/2014   AST 19 08/27/2014   ALKPHOS 49 08/27/2014   BILITOT 0.5 08/27/2014

## 2015-04-30 NOTE — Progress Notes (Signed)
CC'ED TO PCP 

## 2015-05-02 ENCOUNTER — Encounter (HOSPITAL_COMMUNITY): Payer: Self-pay | Admitting: Physical Medicine and Rehabilitation

## 2015-05-02 ENCOUNTER — Emergency Department (HOSPITAL_COMMUNITY)
Admission: EM | Admit: 2015-05-02 | Discharge: 2015-05-02 | Disposition: A | Discharge status: Home / Self Care | Payer: Self-pay | Attending: Emergency Medicine | Admitting: Emergency Medicine

## 2015-05-02 DIAGNOSIS — M545 Low back pain, unspecified: Secondary | ICD-10-CM

## 2015-05-02 DIAGNOSIS — F17210 Nicotine dependence, cigarettes, uncomplicated: Secondary | ICD-10-CM | POA: Insufficient documentation

## 2015-05-02 DIAGNOSIS — M25552 Pain in left hip: Secondary | ICD-10-CM | POA: Insufficient documentation

## 2015-05-02 DIAGNOSIS — G8929 Other chronic pain: Secondary | ICD-10-CM | POA: Insufficient documentation

## 2015-05-02 MED ORDER — DEXAMETHASONE SODIUM PHOSPHATE 10 MG/ML IJ SOLN
10.0000 mg | Freq: Once | INTRAMUSCULAR | Status: AC
  Administered 2015-05-02: 10 mg via INTRAMUSCULAR
  Filled 2015-05-02: qty 1

## 2015-05-02 MED ORDER — KETOROLAC TROMETHAMINE 30 MG/ML IJ SOLN: 30.0000 mg | Freq: Once | INTRAMUSCULAR | Status: DC

## 2015-05-02 MED ORDER — NAPROXEN 500 MG PO TABS: 500.0000 mg | ORAL_TABLET | Freq: Two times a day (BID) | ORAL | Status: DC

## 2015-05-02 MED ORDER — KETOROLAC TROMETHAMINE 60 MG/2ML IM SOLN
60.0000 mg | Freq: Once | INTRAMUSCULAR | Status: AC
  Administered 2015-05-02: 60 mg via INTRAMUSCULAR
  Filled 2015-05-02: qty 2

## 2015-05-02 NOTE — Discharge Instructions (Signed)

## 2015-05-02 NOTE — ED Notes (Signed)
PA at the bedside.

## 2015-05-02 NOTE — ED Provider Notes (Signed)
CSN: [id number]     Arrival date & time 05/02/15  [id number] History   First MD Initiated Contact with Patient 05/02/15 1016     Chief Complaint  Patient presents with  . Back Pain  . Hip Pain     (Consider location/radiation/quality/duration/timing/severity/associated sxs/prior Treatment) HPI   Nathaniel Robinson is a 60 y.o. male with PMH significant for chronic low back pain who presents with exacerbation of his chronic low back pain that began last night.  Denies injury or trauma.  He describes it as constant, severe, aching, and non-radiating.  He currently takes tramadol BID for his pain.  He also receives localized injections.  He presents today for an injection.  He denies fever, chills, CP, SOB, abdominal pain, N/V, urinary symptoms, or b/b incontinence.  No IVDU.  No active malignancy.   Past Medical History  Diagnosis Date  . Chronic low back pain 2010    Past Surgical History  Procedure Laterality Date  . None     Family History  Problem Relation Age of Onset  . Cancer Mother     "cancer where they put baby", from description sounds uterine   . Heart disease Neg Hx   . Hypertension Neg Hx   . Diabetes Neg Hx   . Colon cancer Neg Hx    Social History  Substance Use Topics  . Smoking status: Current Every Day Smoker -- 0.75 packs/day for 25 years    Types: Cigarettes  . Smokeless tobacco: Never Used     Comment: 10 cigarettes a day   . Alcohol Use: No     Comment: social     Review of Systems All other systems negative unless otherwise stated in HPI   Allergies  Review of patient's allergies indicates no known allergies.  Home Medications   Prior to Admission medications   Medication Sig Start Date End Date Taking? Authorizing Provider  traMADol (ULTRAM) 50 MG tablet Take 2 tablets (100 mg total) by mouth every 12 (twelve) hours as needed for moderate pain or severe pain. 03/06/15  Yes Josalyn Funches, MD   BP 131/77 mmHg  Pulse 65  Temp(Src) 97.7 F (36.5 C)  (Oral)  Resp 16  SpO2 100% Physical Exam  Constitutional: He is oriented to person, place, and time. He appears well-developed and well-nourished.  HENT:  Head: Atraumatic.  Eyes: Conjunctivae are normal.  Cardiovascular: Normal rate, regular rhythm, normal heart sounds and intact distal pulses.   Pulses:      Dorsalis pedis pulses are 2+ on the right side, and 2+ on the left side.  Pulmonary/Chest: Effort normal and breath sounds normal.  Abdominal: Soft. Bowel sounds are normal. He exhibits no distension. There is no tenderness.  Musculoskeletal: He exhibits tenderness.       Lumbar back: He exhibits tenderness and pain.       Back:  No spinous process tenderness.  No step offs. No crepitus.  Neurological: He is alert and oriented to person, place, and time.  No saddle anesthesia. Strength and sensation intact bilaterally throughout lower extremities. Gait normal.   Skin: Skin is warm and dry.  Psychiatric: He has a normal mood and affect. His behavior is normal.    ED Course  Procedures (including critical care time) Labs Review Labs Reviewed - No data to display  Imaging Review No results found. I have personally reviewed and evaluated these images and lab results as part of my medical decision-making.   EKG Interpretation None  MDM   Final diagnoses:  Chronic pain  Left-sided low back pain without sciatica   Patient presents with exacerbation of chronic back pain.  No red flags.  VSS, NAD.  Patient currently on tramadol and receives intermittent localized injections.  Patient given decadron and toradol in ED.  Plan to discharge home with Naproxen.  Patient reports he has an appointment next week for another injection.  Discussed return precautions.  Patient agrees and acknowledges the above plan for discharge.     Gloriann Loan, PA-C 05/02/15 1214  Sherwood Gambler, MD 05/03/15 [phone]

## 2015-05-02 NOTE — ED Notes (Signed)
Pt reports chronic lower back and L hip pain. States pain increased today. Denies recent injury

## 2015-05-04 MED FILL — NAPROXEN 500 MG TABLET: 500 | 30 days supply | Qty: 30 | Fill #0

## 2015-05-04 MED FILL — traMADol HCL 50 MG TABS: 50 | 30 days supply | Qty: 120 | Fill #2

## 2015-05-11 ENCOUNTER — Encounter: Payer: Self-pay | Admitting: Physical Medicine & Rehabilitation

## 2015-05-11 ENCOUNTER — Encounter: Payer: Self-pay | Attending: Physical Medicine & Rehabilitation

## 2015-05-11 ENCOUNTER — Ambulatory Visit (HOSPITAL_BASED_OUTPATIENT_CLINIC_OR_DEPARTMENT_OTHER): Payer: Self-pay | Admitting: Physical Medicine & Rehabilitation

## 2015-05-11 VITALS — BP 144/61 | HR 96 | Resp 14

## 2015-05-11 DIAGNOSIS — Z5181 Encounter for therapeutic drug level monitoring: Secondary | ICD-10-CM | POA: Insufficient documentation

## 2015-05-11 DIAGNOSIS — G894 Chronic pain syndrome: Secondary | ICD-10-CM | POA: Insufficient documentation

## 2015-05-11 DIAGNOSIS — M4328 Fusion of spine, sacral and sacrococcygeal region: Secondary | ICD-10-CM | POA: Insufficient documentation

## 2015-05-11 DIAGNOSIS — M533 Sacrococcygeal disorders, not elsewhere classified: Secondary | ICD-10-CM

## 2015-05-11 NOTE — Progress Notes (Signed)
  PROCEDURE RECORD [facility]   Name: Nathaniel Robinson DOB:Jan 26, 1956 MRN: [id number]  Date:05/11/2015  Physician: Alysia Penna, MD    Nurse/CMA: Gara Kroner, CMA  Allergies: No Known Allergies  Consent Signed: Yes.    Is patient diabetic? No.  CBG today? NA   Pregnant: No. LMP: No LMP for male patient. (age 59-55)  Anticoagulants: no Anti-inflammatory: no Antibiotics: no  Procedure: Left Sacroiliac Injection Position: Prone Start Time: 1340 End Time: 1349  Fluoro Time: 11  RN/CMA Amber [facility]	[place]    Time 1322 1352    BP 144/61 140/79    Pulse 96 92    Respirations 14 14    O2 Sat 100 99    S/S 6 6    Pain Level 7/10 5/10     D/C home with public transportation, patient A & O X 3, D/C instructions reviewed, and sits independently.

## 2015-05-11 NOTE — Progress Notes (Signed)
Left sacroiliac injection under fluoroscopic guidance  Indication: Left Low back and buttocks pain not relieved by medication management and other conservative care.  Informed consent was obtained after describing risks and benefits of the procedure with the patient, this includes bleeding, bruising, infection, paralysis and medication side effects. The patient wishes to proceed and has given written consent. The patient was placed in a prone position. The lumbar and sacral area was marked and prepped with Betadine. A 25-gauge 1-1/2 inch needle was inserted into the skin and subcutaneous tissue and 1 mL of 1% lidocaine was injected. Then a 25-gauge 3 inch spinal needle was inserted under fluoroscopic guidance into the left sacroiliac joint. AP and lateral images were utilized. Omnipaque 180x0.5 mL under live fluoroscopy demonstrated no intravascular uptake. Then a solution containing one ML of 6 mg per mLbetamethasone and 2 ML of .25% bupivacaine MPF was injected x1.5 mL. Patient tolerated the procedure well. Post procedure instructions were given. Please see post procedure form.

## 2015-05-15 ENCOUNTER — Ambulatory Visit (HOSPITAL_COMMUNITY)
Admission: RE | Admit: 2015-05-15 | Discharge: 2015-05-15 | Disposition: A | Discharge status: Home / Self Care | Payer: Self-pay | Source: Ambulatory Visit | Attending: Gastroenterology | Admitting: Gastroenterology

## 2015-05-15 ENCOUNTER — Encounter (HOSPITAL_COMMUNITY)
Admission: RE | Disposition: A | Discharge status: Home / Self Care | Payer: Self-pay | Source: Ambulatory Visit | Attending: Gastroenterology

## 2015-05-15 ENCOUNTER — Encounter (HOSPITAL_COMMUNITY): Payer: Self-pay | Admitting: *Deleted

## 2015-05-15 DIAGNOSIS — Z1211 Encounter for screening for malignant neoplasm of colon: Secondary | ICD-10-CM | POA: Insufficient documentation

## 2015-05-15 DIAGNOSIS — D125 Benign neoplasm of sigmoid colon: Secondary | ICD-10-CM | POA: Insufficient documentation

## 2015-05-15 DIAGNOSIS — Z791 Long term (current) use of non-steroidal anti-inflammatories (NSAID): Secondary | ICD-10-CM | POA: Insufficient documentation

## 2015-05-15 DIAGNOSIS — K635 Polyp of colon: Secondary | ICD-10-CM

## 2015-05-15 DIAGNOSIS — G8929 Other chronic pain: Secondary | ICD-10-CM | POA: Insufficient documentation

## 2015-05-15 DIAGNOSIS — F17210 Nicotine dependence, cigarettes, uncomplicated: Secondary | ICD-10-CM | POA: Insufficient documentation

## 2015-05-15 DIAGNOSIS — M545 Low back pain: Secondary | ICD-10-CM | POA: Insufficient documentation

## 2015-05-15 DIAGNOSIS — D128 Benign neoplasm of rectum: Secondary | ICD-10-CM | POA: Insufficient documentation

## 2015-05-15 DIAGNOSIS — D123 Benign neoplasm of transverse colon: Secondary | ICD-10-CM | POA: Insufficient documentation

## 2015-05-15 DIAGNOSIS — D124 Benign neoplasm of descending colon: Secondary | ICD-10-CM | POA: Insufficient documentation

## 2015-05-15 DIAGNOSIS — K648 Other hemorrhoids: Secondary | ICD-10-CM | POA: Insufficient documentation

## 2015-05-15 HISTORY — PX: COLONOSCOPY: SHX5424

## 2015-05-15 SURGERY — COLONOSCOPY
Anesthesia: Moderate Sedation

## 2015-05-15 MED ORDER — MIDAZOLAM HCL 5 MG/5ML IJ SOLN
INTRAMUSCULAR | Status: AC
  Filled 2015-05-15: qty 10

## 2015-05-15 MED ORDER — SODIUM CHLORIDE 0.9 % IV SOLN
INTRAVENOUS | Status: DC
  Administered 2015-05-15: 1000 mL via INTRAVENOUS

## 2015-05-15 MED ORDER — PROMETHAZINE HCL 25 MG/ML IJ SOLN
INTRAMUSCULAR | Status: DC | PRN
  Administered 2015-05-15: 12.5 mg via INTRAVENOUS

## 2015-05-15 MED ORDER — MEPERIDINE HCL 100 MG/ML IJ SOLN
INTRAMUSCULAR | Status: DC | PRN
  Administered 2015-05-15 (×3): 25 mg via INTRAVENOUS

## 2015-05-15 MED ORDER — MIDAZOLAM HCL 5 MG/5ML IJ SOLN
INTRAMUSCULAR | Status: DC | PRN
  Administered 2015-05-15 (×2): 1 mg via INTRAVENOUS
  Administered 2015-05-15: 2 mg via INTRAVENOUS

## 2015-05-15 MED ORDER — PROMETHAZINE HCL 25 MG/ML IJ SOLN
INTRAMUSCULAR | Status: AC
  Filled 2015-05-15: qty 1

## 2015-05-15 MED ORDER — MEPERIDINE HCL 100 MG/ML IJ SOLN
INTRAMUSCULAR | Status: AC
  Filled 2015-05-15: qty 2

## 2015-05-15 NOTE — Progress Notes (Signed)
REVIEWED.  

## 2015-05-15 NOTE — H&P (Signed)
  Primary Care Physician:  Minerva Ends, MD Primary Gastroenterologist:  Dr. Oneida Alar  Pre-Procedure History & Physical: HPI:  Nathaniel Robinson is a 60 y.o. male here for [place].  Past Medical History  Diagnosis Date  . Chronic low back pain 2010     Past Surgical History  Procedure Laterality Date  . None      Prior to Admission medications   Medication Sig Start Date End Date Taking? Authorizing Provider  naproxen (NAPROSYN) 500 MG tablet Take 1 tablet (500 mg total) by mouth 2 (two) times daily. 05/02/15   Gloriann Loan, PA-C  traMADol (ULTRAM) 50 MG tablet Take 2 tablets (100 mg total) by mouth every 12 (twelve) hours as needed for moderate pain or severe pain. 03/06/15   Boykin Nearing, MD    Allergies as of 04/28/2015  . (No Known Allergies)    Family History  Problem Relation Age of Onset  . Cancer Mother     "cancer where they put baby", from description sounds uterine   . Heart disease Neg Hx   . Hypertension Neg Hx   . Diabetes Neg Hx   . Colon cancer Neg Hx     Social History   Social History  . Marital Status: Married    Spouse Name: N/A  . Number of Children: 4  . Years of Education: N/A   Occupational History  . Previous Accountant      [place]    Social History Main Topics  . Smoking status: Current Every Day Smoker -- 0.75 packs/day for 25 years    Types: Cigarettes  . Smokeless tobacco: Never Used     Comment: 10 cigarettes a day   . Alcohol Use: 0.0 oz/week    0 Standard drinks or equivalent per week     Comment: social   . Drug Use: No  . Sexual Activity: No   Other Topics Concern  . Not on file   Social History Narrative   Form [place]   Moved to [place] in 01/2014    Lives in [place] alone   Wife and 4 children in [place].     Review of Systems: See HPI, otherwise negative ROS   Physical Exam: BP 112/64 mmHg  Pulse 73  Temp(Src) 98.6 F (37 C) (Oral)  Resp 16  Ht 5\' 5"  (1.651 m)  Wt 136 lb (61.689 kg)  BMI  22.63 kg/m2  SpO2 100% General:   Alert,  pleasant and cooperative in NAD Head:  Normocephalic and atraumatic. Neck:  Supple; Lungs:  Clear throughout to auscultation.    Heart:  Regular rate and rhythm. Abdomen:  Soft, nontender and nondistended. Normal bowel sounds, without guarding, and without rebound.   Neurologic:  Alert and  oriented x4;  grossly normal neurologically.  Impression/Plan:    SCREENING  Plan:  1. TCS TODAY

## 2015-05-15 NOTE — Discharge Instructions (Signed)
You had 13 small polyps removed. You have internal hemorrhoids.   FOLLOW A HIGH FIBER DIET. AVOID ITEMS THAT CAUSE BLOATING & GAS. SEE INFO BELOW.  YOUR BIOPSY RESULTS WILL BE AVAILABLE IN MY CHART APR 3 AND MY OFFICE WILL CONTACT YOU IN 10-14 DAYS WITH YOUR RESULTS.   Next colonoscopy in 5-10 years.   Colonoscopy Care After Read the instructions outlined below and refer to this sheet in the next week. These discharge instructions provide you with general information on caring for yourself after you leave the hospital. While your treatment has been planned according to the most current medical practices available, unavoidable complications occasionally occur. If you have any problems or questions after discharge, call DR. Santoria Chason, [phone].  ACTIVITY  You may resume your regular activity, but move at a slower pace for the next 24 hours.   Take frequent rest periods for the next 24 hours.   Walking will help get rid of the air and reduce the bloated feeling in your belly (abdomen).   No driving for 24 hours (because of the medicine (anesthesia) used during the test).   You may shower.   Do not sign any important legal documents or operate any machinery for 24 hours (because of the anesthesia used during the test).    NUTRITION  Drink plenty of fluids.   You may resume your normal diet as instructed by your doctor.   Begin with a light meal and progress to your normal diet. Heavy or fried foods are harder to digest and may make you feel sick to your stomach (nauseated).   Avoid alcoholic beverages for 24 hours or as instructed.    MEDICATIONS  You may resume your normal medications.   WHAT YOU CAN EXPECT TODAY  Some feelings of bloating in the abdomen.   Passage of more gas than usual.   Spotting of blood in your stool or on the toilet paper  .  IF YOU HAD POLYPS REMOVED DURING THE COLONOSCOPY:  Eat a soft diet IF YOU HAVE NAUSEA, BLOATING, ABDOMINAL PAIN, OR  VOMITING.    FINDING OUT THE RESULTS OF YOUR TEST Not all test results are available during your visit. DR. Oneida Alar WILL CALL YOU WITHIN 14 DAYS OF YOUR PROCEDUE WITH YOUR RESULTS. Do not assume everything is normal if you have not heard from DR. Lonie Newsham, CALL HER OFFICE AT [phone].  SEEK IMMEDIATE MEDICAL ATTENTION AND CALL THE OFFICE: [phone] IF:  You have more than a spotting of blood in your stool.   Your belly is swollen (abdominal distention).   You are nauseated or vomiting.   You have a temperature over 101F.   You have abdominal pain or discomfort that is severe or gets worse throughout the day.   High-Fiber Diet A high-fiber diet changes your normal diet to include more whole grains, legumes, fruits, and vegetables. Changes in the diet involve replacing refined carbohydrates with unrefined foods. The calorie level of the diet is essentially unchanged. The Dietary Reference Intake (recommended amount) for adult males is 38 grams per day. For adult females, it is 25 grams per day. Pregnant and lactating women should consume 28 grams of fiber per day. Fiber is the intact part of a plant that is not broken down during digestion. Functional fiber is fiber that has been isolated from the plant to provide a beneficial effect in the body. PURPOSE  Increase stool bulk.   Ease and regulate bowel movements.   Lower cholesterol.  REDUCE RISK  OF COLON CANCER  INDICATIONS THAT YOU NEED MORE FIBER  Constipation and hemorrhoids.   Uncomplicated diverticulosis (intestine condition) and irritable bowel syndrome.   Weight management.   As a protective measure against hardening of the arteries (atherosclerosis), diabetes, and cancer.   GUIDELINES FOR INCREASING FIBER IN THE DIET  Start adding fiber to the diet slowly. A gradual increase of about 5 more grams (2 slices of whole-wheat bread, 2 servings of most fruits or vegetables, or 1 bowl of high-fiber cereal) per day is  best. Too rapid an increase in fiber may result in constipation, flatulence, and bloating.   Drink enough water and fluids to keep your urine clear or pale yellow. Water, juice, or caffeine-free drinks are recommended. Not drinking enough fluid may cause constipation.   Eat a variety of high-fiber foods rather than one type of fiber.   Try to increase your intake of fiber through using high-fiber foods rather than fiber pills or supplements that contain small amounts of fiber.   The goal is to change the types of food eaten. Do not supplement your present diet with high-fiber foods, but replace foods in your present diet.   INCLUDE A VARIETY OF FIBER SOURCES  Replace refined and processed grains with whole grains, canned fruits with fresh fruits, and incorporate other fiber sources. White rice, white breads, and most bakery goods contain little or no fiber.   Brown whole-grain rice, buckwheat oats, and many fruits and vegetables are all good sources of fiber. These include: broccoli, Brussels sprouts, cabbage, cauliflower, beets, sweet potatoes, white potatoes (skin on), carrots, tomatoes, eggplant, squash, berries, fresh fruits, and dried fruits.   Cereals appear to be the richest source of fiber. Cereal fiber is found in whole grains and bran. Bran is the fiber-rich outer coat of cereal grain, which is largely removed in refining. In whole-grain cereals, the bran remains. In breakfast cereals, the largest amount of fiber is found in those with "bran" in their names. The fiber content is sometimes indicated on the label.   You may need to include additional fruits and vegetables each day.   In baking, for 1 cup white flour, you may use the following substitutions:   1 cup whole-wheat flour minus 2 tablespoons.   1/2 cup white flour plus 1/2 cup whole-wheat flour.   Polyps, Colon  A polyp is extra tissue that grows inside your body. Colon polyps grow in the large intestine. The large  intestine, also called the colon, is part of your digestive system. It is a long, hollow tube at the end of your digestive tract where your body makes and stores stool. Most polyps are not dangerous. They are benign. This means they are not cancerous. But over time, some types of polyps can turn into cancer. Polyps that are smaller than a pea are usually not harmful. But larger polyps could someday become or may already be cancerous. To be safe, doctors remove all polyps and test them.   PREVENTION There is not one sure way to prevent polyps. You might be able to lower your risk of getting them if you:  Eat more fruits and vegetables and less fatty food.   Do not smoke.   Avoid alcohol.   Exercise every day.   Lose weight if you are overweight.   Eating more calcium and folate can also lower your risk of getting polyps. Some foods that are rich in calcium are milk, cheese, and broccoli. Some foods that are rich in  folate are chickpeas, kidney beans, and spinach.   Hemorrhoids Hemorrhoids are dilated (enlarged) veins around the rectum. Sometimes clots will form in the veins. This makes them swollen and painful. These are called thrombosed hemorrhoids. Causes of hemorrhoids include:  Constipation.   Straining to have a bowel movement.   HEAVY LIFTING  HOME CARE INSTRUCTIONS  Eat a well balanced diet and drink 6 to 8 glasses of water every day to avoid constipation. You may also use a bulk laxative.   Avoid straining to have bowel movements.   Keep anal area dry and clean.   Do not use a donut shaped pillow or sit on the toilet for long periods. This increases blood pooling and pain.   Move your bowels when your body has the urge; this will require less straining and will decrease pain and pressure.

## 2015-05-16 NOTE — Op Note (Signed)
[facility], [place] Patient Name: Nathaniel Robinson Procedure Date: 05/15/2015 1:33 PM MRN: [id number] Date of Birth: 03/02/1955 Attending MD: Barney Drain , MD CSN: [id number] Age: 60 Admit Type: Outpatient Procedure:                Colonoscopy Indications:              Screening for colorectal malignant neoplasm Providers:                Barney Drain, MD, Rosina Lowenstein, RN, Randa Spike,                            Technician Referring MD:             Boykin Nearing MD, MD (Referring MD) Medicines:                Promethazine 12.5 mg IV, Meperidine 75 mg IV,                            Midazolam 4 mg IV Complications:            No immediate complications. Estimated Blood Loss:     Estimated blood loss was minimal. Procedure:                Pre-Anesthesia Assessment:                           - Prior to the procedure, a History and Physical                            was performed, and patient medications and                            allergies were reviewed. The patient's tolerance of                            previous anesthesia was also reviewed. The risks                            and benefits of the procedure and the sedation                            options and risks were discussed with the patient.                            All questions were answered, and informed consent                            was obtained. Prior Anticoagulants: The patient has                            taken no previous anticoagulant or antiplatelet                            agents. ASA Grade Assessment: I - A normal, healthy  patient. After reviewing the risks and benefits,                            the patient was deemed in satisfactory condition to                            undergo the procedure.                           After obtaining informed consent, the colonoscope                            was passed under direct vision. Throughout the   procedure, the patient's blood pressure, pulse, and                            oxygen saturations were monitored continuously. The                            [id number]) was introduced through the                            anus and advanced to the the cecum, identified by                            appendiceal orifice and ileocecal valve. The                            ileocecal valve, appendiceal orifice, and rectum                            were photographed. The colonoscopy was somewhat                            difficult due to the patient's agitation.                            Successful completion of the procedure was aided by                            increasing the dose of sedation medication and                            using manual pressure. The quality of the bowel                            preparation was excellent. Scope In: 1:50:37 PM Scope Out: 2:17:41 PM Scope Withdrawal Time: 0 hours 20 minutes 31 seconds  Total Procedure Duration: 0 hours 27 minutes 4 seconds  Findings:      The digital rectal exam was normal.      11 sessile and semi-pedunculated polyps were found in the rectum,       sigmoid colon, descending colon, transverse colon and hepatic flexure.       The polyps were 2 to 5 mm in size.  These polyps were removed with a cold       biopsy forceps. Resection and retrieval were complete. Estimated blood       loss was minimal.      Two sessile polyps were found in the rectum. The polyps were 2 to 8 mm       in size. These polyps were removed with a hot snare. Resection and       retrieval were complete. Estimated blood loss was minimal.      Non-bleeding internal hemorrhoids were found. The hemorrhoids were       medium-sized. Impression:               - 11 2 to 5 mm polyps in the rectum, in the sigmoid                            colon, in the descending colon, in the transverse                            colon and at the hepatic flexure, removed  with a                            cold biopsy forceps. Resected and retrieved.                           - Two 2 to 8 mm polyps in the rectum, removed with                            a hot snare. Resected and retrieved.                           - Non-bleeding internal hemorrhoids. Moderate Sedation:      Moderate (conscious) sedation was administered by the endoscopy nurse       and supervised by the endoscopist. The following parameters were       monitored: oxygen saturation, heart rate, blood pressure, and response       to care. Total physician intraservice time was 37 minutes. Recommendation:           - Patient has a contact number available for                            emergencies. The signs and symptoms of potential                            delayed complications were discussed with the                            patient. Return to normal activities tomorrow.                            Written discharge instructions were provided to the                            patient.                           -  High fiber diet.                           - Await pathology results.                           - Continue present medications.                           - Repeat colonoscopy in 5-10 years for surveillance. Procedure Code(s):        --- Professional ---                           [phone], Colonoscopy, flexible; with removal of                            tumor(s), polyp(s), or other lesion(s) by snare                            technique                           45380, 59, Colonoscopy, flexible; with biopsy,                            single or multiple                           99153, Moderate sedation services; each additional                            15 minutes intraservice time                           G0500, Moderate sedation services provided by the                            same physician or other qualified health care                            professional performing a  gastrointestinal                            endoscopic service that sedation supports,                            requiring the presence of an independent trained                            observer to assist in the monitoring of the                            patient's level of consciousness and physiological                            status; initial 15 minutes of intra-service time;  patient age 54 years or older (additional time may                            be reported with [phone], as appropriate) Diagnosis Code(s):        --- Professional ---                           Z12.11, Encounter for screening for malignant                            neoplasm of colon                           D12.5, Benign neoplasm of sigmoid colon                           D12.4, Benign neoplasm of descending colon                           D12.3, Benign neoplasm of transverse colon (hepatic                            flexure or splenic flexure)                           K62.1, Rectal polyp                           K64.8, Other hemorrhoids CPT copyright 2016 [employer] Association. All rights reserved. The codes documented in this report are preliminary and upon coder review may  be revised to meet current compliance requirements. Barney Drain, MD Barney Drain, MD 05/16/2015 12:52:08 AM This report has been signed electronically. Number of Addenda: 0

## 2015-05-20 ENCOUNTER — Encounter (HOSPITAL_COMMUNITY): Payer: Self-pay | Admitting: Gastroenterology

## 2015-05-31 ENCOUNTER — Telehealth: Payer: Self-pay | Admitting: Gastroenterology

## 2015-05-31 NOTE — Telephone Encounter (Signed)
Please call pt. He had TWO simple adenomas and 11 hyperplastic polyps removed.    FOLLOW A HIGH FIBER DIET. AVOID ITEMS THAT CAUSE BLOATING & GAS.   Next colonoscopy in 5-10 years.

## 2015-06-01 ENCOUNTER — Ambulatory Visit: Payer: Self-pay | Admitting: Family Medicine

## 2015-06-01 NOTE — Telephone Encounter (Signed)
Reminder in epic °

## 2015-06-01 NOTE — Telephone Encounter (Signed)
Pt is aware and wanted results sent to PCP.  Routed results to Dr. Adrian Blackwater

## 2015-06-03 ENCOUNTER — Ambulatory Visit: Payer: Self-pay | Attending: Family Medicine | Admitting: Family Medicine

## 2015-06-03 ENCOUNTER — Encounter: Payer: Self-pay | Admitting: Family Medicine

## 2015-06-03 VITALS — BP 124/78 | HR 61 | Temp 98.5°F | Resp 16 | Ht 65.0 in | Wt 150.0 lb

## 2015-06-03 DIAGNOSIS — M545 Low back pain: Secondary | ICD-10-CM | POA: Insufficient documentation

## 2015-06-03 DIAGNOSIS — F17200 Nicotine dependence, unspecified, uncomplicated: Secondary | ICD-10-CM

## 2015-06-03 DIAGNOSIS — Z79899 Other long term (current) drug therapy: Secondary | ICD-10-CM | POA: Insufficient documentation

## 2015-06-03 DIAGNOSIS — Z720 Tobacco use: Secondary | ICD-10-CM

## 2015-06-03 DIAGNOSIS — G8929 Other chronic pain: Secondary | ICD-10-CM | POA: Insufficient documentation

## 2015-06-03 DIAGNOSIS — F17210 Nicotine dependence, cigarettes, uncomplicated: Secondary | ICD-10-CM | POA: Insufficient documentation

## 2015-06-03 DIAGNOSIS — K5900 Constipation, unspecified: Secondary | ICD-10-CM | POA: Insufficient documentation

## 2015-06-03 MED ORDER — MELOXICAM 15 MG PO TABS: 15.0000 mg | ORAL_TABLET | Freq: Every day | ORAL | Status: DC

## 2015-06-03 MED ORDER — VARENICLINE TARTRATE 0.5 MG X 11 & 1 MG X 42 PO MISC: ORAL | Status: DC

## 2015-06-03 MED ORDER — POLYETHYLENE GLYCOL 3350 17 GM/SCOOP PO POWD: 17.0000 g | Freq: Every day | ORAL | Status: DC

## 2015-06-03 MED ORDER — TRAMADOL HCL 50 MG PO TABS: 100.0000 mg | ORAL_TABLET | Freq: Two times a day (BID) | ORAL | Status: DC | PRN

## 2015-06-03 MED ORDER — VARENICLINE TARTRATE 1 MG PO TABS: 1.0000 mg | ORAL_TABLET | Freq: Two times a day (BID) | ORAL | Status: DC

## 2015-06-03 MED FILL — POLYETHYLENE GLYCOL 3350: 27 days supply | Qty: 510 | Fill #0

## 2015-06-03 MED FILL — traMADol HCL 50 MG TABS: 50 | 30 days supply | Qty: 120 | Fill #0

## 2015-06-03 MED FILL — MELOXICAM 15 MG TABLET: 15 | 30 days supply | Qty: 30 | Fill #0

## 2015-06-03 NOTE — Assessment & Plan Note (Signed)
A; current smoker, desires to quit P: Patches OTC or by call [place] Quit line Or  chantix Rx via PASS, patient medication assistance program

## 2015-06-03 NOTE — Assessment & Plan Note (Signed)
A: chronic low back pain P: Refilled tramadol Change naproxen to mobic with food Add miralax

## 2015-06-03 NOTE — Patient Instructions (Addendum)
Nathaniel Robinson was seen today for back pain, abdominal pain and nicotine dependence.  Diagnoses and all orders for this visit:  Chronic low back pain -     meloxicam (MOBIC) 15 MG tablet; Take 1 tablet (15 mg total) by mouth daily. -     traMADol (ULTRAM) 50 MG tablet; Take 2 tablets (100 mg total) by mouth every 12 (twelve) hours as needed for moderate pain or severe pain.  Constipation, unspecified constipation type -     polyethylene glycol powder (GLYCOLAX/MIRALAX) powder; Take 17 g by mouth daily.  Current smoker -     varenicline (CHANTIX PAK) 0.5 MG X 11 & 1 MG X 42 tablet; Taper per packet insert. For PASS -     varenicline (CHANTIX CONTINUING MONTH PAK) 1 MG tablet; Take 1 tablet (1 mg total) by mouth 2 (two) times daily. For PASS  Start chantix: Take one 0.5 mg tablet by mouth once daily for 3 days, then increase to one 0.5 mg tablet twice daily for 4 days, then increase to one 1 mg tablet twice daily. Set quit date for 8-35 days after starting chantix.  STOP at pharmacy to apply for PASS for chantix   F/u in 8 weeks for smoking cessation   Dr. Adrian Blackwater

## 2015-06-03 NOTE — Progress Notes (Signed)
Use pacific interpreter Tigrinian [phone] Medicine refills F/U Colonoscopy results Pain scale #6 Tobacco user 10 cigarrete per day No suicidal thoughts in the past two weeks

## 2015-06-03 NOTE — Progress Notes (Signed)
Subjective:  Patient ID: Nathaniel Robinson, male    DOB: 1955-07-21  Age: 59 y.o. MRN: [id number]  CC: No chief complaint on file. [place] interpreter used ID # [id number]   HPI Nathaniel Robinson presents for   1. Back pain: chronic low back pain. Patient established with pain management where he receives lumbar injections every 3 months. No weakness in legs. No fever or chills. Requesting refill of tramadol. Reports once weekly bowel movement and lower abdominal pain. He reports that naproxen worsens his pain.  Pain interferes with work. He has not worked for past 6 years. He is at risk of losing his home due to lack of income. He is planning to apply for disability.   2. Smoking; he back smoking 10-15 cigs per day. He would like to quit. He is started smoking due to stress regarding lack of income. He tried patches briefly and they helped. He is uninsured and states that he cannot afford patches OTC. He denies depression and SI.   Social History  Substance Use Topics  . Smoking status: Current Every Day Smoker -- 0.75 packs/day for 25 years    Types: Cigarettes  . Smokeless tobacco: Never Used     Comment: 10 cigarettes a day   . Alcohol Use: 0.0 oz/week    0 Standard drinks or equivalent per week     Comment: social    Outpatient Prescriptions Prior to Visit  Medication Sig Dispense Refill  . naproxen (NAPROSYN) 500 MG tablet Take 1 tablet (500 mg total) by mouth 2 (two) times daily. 30 tablet 0  . traMADol (ULTRAM) 50 MG tablet Take 2 tablets (100 mg total) by mouth every 12 (twelve) hours as needed for moderate pain or severe pain. 120 tablet 2   No facility-administered medications prior to visit.    ROS Review of Systems  Constitutional: Negative for fever, chills, fatigue and unexpected weight change.  Eyes: Negative for visual disturbance.  Respiratory: Negative for cough and shortness of breath.   Cardiovascular: Negative for chest pain, palpitations and leg swelling.    Gastrointestinal: Positive for abdominal pain and constipation. Negative for nausea, vomiting, diarrhea, blood in stool, anal bleeding and rectal pain.  Endocrine: Negative for polydipsia, polyphagia and polyuria.  Musculoskeletal: Positive for back pain. Negative for myalgias, arthralgias, gait problem and neck pain.  Skin: Negative for rash.  Allergic/Immunologic: Negative for immunocompromised state.  Hematological: Negative for adenopathy. Does not bruise/bleed easily.  Psychiatric/Behavioral: Negative for suicidal ideas, sleep disturbance and dysphoric mood. The patient is not nervous/anxious.     Objective:  BP 124/78 mmHg  Pulse 61  Temp(Src) 98.5 F (36.9 C) (Oral)  Resp 16  Ht 5\' 5"  (1.651 m)  Wt 150 lb (68.04 kg)  BMI 24.96 kg/m2  SpO2 98%  BP/Weight 06/03/2015 05/15/2015 0000000  Systolic BP [id number] [id number] [id number]  Diastolic BP 78 67 61  Wt. (Lbs) 150 136 -  BMI 24.96 22.63 -    Physical Exam  Constitutional: He appears well-developed and well-nourished. No distress.  HENT:  Head: Normocephalic and atraumatic.  Neck: Normal range of motion. Neck supple.  Cardiovascular: Normal rate, regular rhythm, normal heart sounds and intact distal pulses.   Pulmonary/Chest: Effort normal and breath sounds normal.  Abdominal: Soft. Bowel sounds are normal. He exhibits no distension and no mass. There is no tenderness. There is no rebound and no guarding.  Musculoskeletal: He exhibits tenderness. He exhibits no edema.       Back:  Neurological: He  is alert.  Skin: Skin is warm and dry. No rash noted. No erythema.  Psychiatric: He has a normal mood and affect.     Assessment & Plan:   Problem List Items Addressed This Visit    Current smoker (Chronic)   Relevant Medications   varenicline (CHANTIX PAK) 0.5 MG X 11 & 1 MG X 42 tablet   varenicline (CHANTIX CONTINUING MONTH PAK) 1 MG tablet   Chronic low back pain - Primary (Chronic)   Relevant Medications   meloxicam (MOBIC) 15  MG tablet   traMADol (ULTRAM) 50 MG tablet    Other Visit Diagnoses    Constipation, unspecified constipation type        Relevant Medications    polyethylene glycol powder (GLYCOLAX/MIRALAX) powder       No orders of the defined types were placed in this encounter.    Follow-up: No Follow-up on file.   Boykin Nearing MD

## 2015-06-10 ENCOUNTER — Other Ambulatory Visit: Payer: Self-pay | Admitting: *Deleted

## 2015-06-10 DIAGNOSIS — F17200 Nicotine dependence, unspecified, uncomplicated: Secondary | ICD-10-CM

## 2015-06-10 MED ORDER — VARENICLINE TARTRATE 0.5 MG X 11 & 1 MG X 42 PO MISC: ORAL | Status: DC

## 2015-06-11 ENCOUNTER — Ambulatory Visit: Payer: Self-pay | Admitting: Physical Medicine & Rehabilitation

## 2015-06-11 ENCOUNTER — Other Ambulatory Visit: Payer: Self-pay | Admitting: Family Medicine

## 2015-07-03 MED FILL — traMADol HCL 50 MG TABS: 50 | 30 days supply | Qty: 120 | Fill #1

## 2015-07-03 MED FILL — MELOXICAM 15 MG TABLET: 15 | 30 days supply | Qty: 30 | Fill #1

## 2015-07-31 MED FILL — traMADol HCL 50 MG TABS: 50 | 30 days supply | Qty: 120 | Fill #2

## 2015-07-31 MED FILL — MELOXICAM 15 MG TABLET: 15 | 30 days supply | Qty: 30 | Fill #2

## 2015-08-11 ENCOUNTER — Ambulatory Visit: Payer: Self-pay | Admitting: Physical Medicine & Rehabilitation

## 2015-08-26 ENCOUNTER — Other Ambulatory Visit: Payer: Self-pay | Admitting: Family Medicine

## 2015-08-26 DIAGNOSIS — G8929 Other chronic pain: Secondary | ICD-10-CM

## 2015-08-26 DIAGNOSIS — M545 Low back pain: Principal | ICD-10-CM

## 2015-08-26 MED FILL — MELOXICAM 15 MG TABLET: 15 | 30 days supply | Qty: 30 | Fill #3

## 2015-08-26 MED FILL — CHANTIX STARTING MONTH BOX: 0.5 MG X 11 | 30 days supply | Qty: 53 | Fill #0

## 2015-08-27 NOTE — Telephone Encounter (Signed)
Tramadol ready for pick up  

## 2015-08-27 NOTE — Telephone Encounter (Signed)
Patient verified DOB Patient is aware of prescription being placed at the front desk for pickup. No further questions at this time.

## 2015-08-28 MED FILL — traMADol HCL 50 MG TABS: 50 | 30 days supply | Qty: 120 | Fill #0

## 2015-09-08 ENCOUNTER — Other Ambulatory Visit: Payer: Self-pay

## 2015-09-08 ENCOUNTER — Encounter: Payer: Self-pay | Admitting: Family Medicine

## 2015-09-08 ENCOUNTER — Ambulatory Visit: Payer: Medicaid Other | Attending: Family Medicine | Admitting: Family Medicine

## 2015-09-08 VITALS — BP 120/76 | HR 71 | Temp 98.6°F | Ht 65.0 in | Wt 134.0 lb

## 2015-09-08 DIAGNOSIS — R079 Chest pain, unspecified: Secondary | ICD-10-CM | POA: Diagnosis present

## 2015-09-08 DIAGNOSIS — Z79899 Other long term (current) drug therapy: Secondary | ICD-10-CM | POA: Insufficient documentation

## 2015-09-08 DIAGNOSIS — R634 Abnormal weight loss: Secondary | ICD-10-CM | POA: Diagnosis present

## 2015-09-08 DIAGNOSIS — F17210 Nicotine dependence, cigarettes, uncomplicated: Secondary | ICD-10-CM | POA: Diagnosis not present

## 2015-09-08 LAB — COMPLETE METABOLIC PANEL WITH GFR
ALBUMIN: 3.8 g/dL (ref 3.6–5.1)
ALK PHOS: 49 U/L (ref 40–115)
ALT: 19 U/L (ref 9–46)
AST: 22 U/L (ref 10–35)
BILIRUBIN TOTAL: 0.5 mg/dL (ref 0.2–1.2)
BUN: 10 mg/dL (ref 7–25)
CALCIUM: 8.6 mg/dL (ref 8.6–10.3)
CHLORIDE: 106 mmol/L (ref 98–110)
CO2: 24 mmol/L (ref 20–31)
CREATININE: 0.68 mg/dL — AB (ref 0.70–1.33)
GFR, Est Non African American: >89 mL/min (ref >=60)
Glucose, Bld: 103 mg/dL — ABNORMAL HIGH (ref 65–99)
Potassium: 3.8 mmol/L (ref 3.5–5.3)
Sodium: 138 mmol/L (ref 135–146)
Total Protein: 6.1 g/dL (ref 6.1–8.1)

## 2015-09-08 LAB — HEMOCCULT GUIAC POC 1CARD (OFFICE): FECAL OCCULT BLD: NEGATIVE

## 2015-09-08 LAB — CBC
HEMATOCRIT: 41.7 % (ref 38.5–50.0)
HEMOGLOBIN: 14 g/dL (ref 13.2–17.1)
MCH: 32 pg (ref 27.0–33.0)
MCHC: 33.6 g/dL (ref 32.0–36.0)
MCV: 95.4 fL (ref 80.0–100.0)
MPV: 9.3 fL (ref 7.5–12.5)
Platelets: 247 10*3/uL (ref 140–400)
RBC: 4.37 MIL/uL (ref 4.20–5.80)
RDW: 13.5 % (ref 11.0–15.0)
WBC: 5.5 10*3/uL (ref 3.8–10.8)

## 2015-09-08 LAB — TSH: TSH: 1.78 mIU/L (ref 0.40–4.50)

## 2015-09-08 MED ORDER — PANTOPRAZOLE SODIUM 40 MG PO TBEC: 40.0000 mg | DELAYED_RELEASE_TABLET | Freq: Every day | ORAL | 1 refills | Status: DC

## 2015-09-08 MED FILL — PANTOPRAZOLE SOD DR 40 MG T: 40 | 30 days supply | Qty: 30 | Fill #0

## 2015-09-08 NOTE — Assessment & Plan Note (Signed)
Unintentional weight loss in male smoker with heme negative stools, no GI symptoms   Plan: CBC, CMP, TSH CXR to evaluate for pulmonary nodule Increase intake of calories  Smoking cessation

## 2015-09-08 NOTE — Progress Notes (Signed)
Subjective:  Patient ID: Nathaniel Robinson, male    DOB: 09-07-1955  Age: 60 y.o. MRN: [id number]  CC: Chest Pain (Onset 1 month) [place] interpreter used   HPI Nathaniel Robinson has chronic back pain, smoker, he presents for    1. Chest pain: off and on for one month. No chest pain today. Pain comes every 2-3 days. Sharp pain. Moderate. L chest. Last for a few seconds. There is no associated fever, chills, cough, SOB, nausea, emesis. No trauma to chest. He continues to smoke 1/2 PPD.   2. Unintentional weight loss: he had not noticed his weight loss. He denies change in diet or exercise. He wears dentures and denies oral pain or pain with swallowing. He denies nausea, vomiting and diarrhea. He denies fever and chills. No rash. No change in mood. Overall his back pain is stable and controlled with current regimen. He has a screening c-scope in 05/2015 with polypectomy (hyperplastic polyp and tubular adenoma). He denies recent fasting.    Social History  Substance Use Topics  . Smoking status: Current Every Day Smoker    Packs/day: 0.75    Years: 25.00    Types: Cigarettes  . Smokeless tobacco: Never Used     Comment: 10 cigarettes a day   . Alcohol use 0.0 oz/week     Comment: social    Outpatient Medications Prior to Visit  Medication Sig Dispense Refill  . meloxicam (MOBIC) 15 MG tablet Take 1 tablet (15 mg total) by mouth daily. 30 tablet 3  . polyethylene glycol powder (GLYCOLAX/MIRALAX) powder Take 17 g by mouth daily. 3350 g 3  . traMADol (ULTRAM) 50 MG tablet TAKE 2 TABLETS BY MOUTH EVERY 12 HOURS AS NEEDED FOR MODERATE PAIN OR SEVERE PAIN 120 tablet 2  . varenicline (CHANTIX CONTINUING MONTH PAK) 1 MG tablet Take 1 tablet (1 mg total) by mouth 2 (two) times daily. For PASS (Patient not taking: Reported on 09/08/2015) 60 tablet 1  . varenicline (CHANTIX PAK) 0.5 MG X 11 & 1 MG X 42 tablet Taper per packet insert. For PASS (Patient not taking: Reported on 09/08/2015) 53 tablet 3   No  facility-administered medications prior to visit.     ROS Review of Systems  Constitutional: Negative for chills, fatigue, fever and unexpected weight change.  Eyes: Negative for visual disturbance.  Respiratory: Negative for cough and shortness of breath.   Cardiovascular: Positive for chest pain. Negative for palpitations and leg swelling.  Gastrointestinal: Negative for abdominal pain, anal bleeding, blood in stool, constipation, diarrhea, nausea, rectal pain and vomiting.  Endocrine: Negative for polydipsia, polyphagia and polyuria.  Musculoskeletal: Positive for back pain. Negative for arthralgias, gait problem, myalgias and neck pain.  Skin: Negative for rash.  Allergic/Immunologic: Negative for immunocompromised state.  Hematological: Negative for adenopathy. Does not bruise/bleed easily.  Psychiatric/Behavioral: Negative for dysphoric mood, sleep disturbance and suicidal ideas. The patient is not nervous/anxious.     Objective:  BP 120/76   Pulse 71   Temp 98.6 F (37 C) (Oral)   Ht 5\' 5"  (1.651 m)   Wt 134 lb (60.8 kg)   SpO2 96%   BMI 22.30 kg/m   BP/Weight 09/08/2015 06/03/2015 [id number]  Systolic BP 123456 [id number]  Diastolic BP 76 78 67  Wt. (Lbs) 134 150 136  BMI 22.3 24.96 22.63   Physical Exam  Constitutional: He appears well-developed and well-nourished. No distress.  Thin   HENT:  Head: Normocephalic and atraumatic.  Neck: Normal range of  motion. Neck supple.  Cardiovascular: Normal rate, regular rhythm, normal heart sounds and intact distal pulses.   Pulmonary/Chest: Effort normal and breath sounds normal.  Abdominal: Soft. Bowel sounds are normal. He exhibits no distension and no mass. There is no tenderness. There is no rebound and no guarding.  Genitourinary: Rectum normal and prostate normal. Rectal exam shows no external hemorrhoid, no internal hemorrhoid, no fissure, anal tone normal and guaiac negative stool.  Musculoskeletal: He exhibits no edema.    Lymphadenopathy:    He has no cervical adenopathy.    He has no axillary adenopathy.       Right: No inguinal and no supraclavicular adenopathy present.       Left: No inguinal and no supraclavicular adenopathy present.  Neurological: He is alert.  Skin: Skin is warm and dry. No rash noted. No erythema.  Psychiatric: He has a normal mood and affect.   EKG: normal EKG, normal sinus rhythm, unchanged from previous tracings.  Assessment & Plan:  Nathaniel Robinson was seen today for chest pain.  Diagnoses and all orders for this visit:  Chest pain, unspecified chest pain type -     EKG 12-Lead -     DG Chest 2 View; Future -     pantoprazole (PROTONIX) 40 MG tablet; Take 1 tablet (40 mg total) by mouth daily before supper.  Unintentional weight loss -     POCT occult blood stool -     TSH -     CBC -     COMPLETE METABOLIC PANEL WITH GFR   Problem List Items Addressed This Visit      Unprioritized   Unintentional weight loss   Relevant Orders   POCT occult blood stool (Completed)   TSH   CBC   COMPLETE METABOLIC PANEL WITH GFR   Pain in the chest - Primary   Relevant Medications   pantoprazole (PROTONIX) 40 MG tablet   Other Relevant Orders   EKG 12-Lead   DG Chest 2 View    Other Visit Diagnoses   None.    No orders of the defined types were placed in this encounter.   Follow-up: Return in about 4 weeks (around 10/06/2015) for weight check .   Boykin Nearing MD

## 2015-09-08 NOTE — Patient Instructions (Addendum)
[place] was seen today for chest pain.  Diagnoses and all orders for this visit:  Chest pain, unspecified chest pain type -     EKG 12-Lead -     DG Chest 2 View; Future -     pantoprazole (PROTONIX) 40 MG tablet; Take 1 tablet (40 mg total) by mouth daily before supper.  Unintentional weight loss -     POCT occult blood stool -     TSH -     CBC -     COMPLETE METABOLIC PANEL WITH GFR   Increase intake of calories especially protein and healthy fats (cheese, avocado etc.) Avoid fasting.   F/u in 4 weeks for weight check   Dr. Adrian Blackwater

## 2015-09-08 NOTE — Assessment & Plan Note (Signed)
Sharp pains in L chest intermittent with rest Suspect GERD Add PPI ekg without ischemic changes CXR ordered Smoking cessation Close f/u

## 2015-09-11 ENCOUNTER — Encounter: Payer: Self-pay | Admitting: Physical Medicine & Rehabilitation

## 2015-09-11 ENCOUNTER — Encounter: Payer: Medicaid Other | Attending: Physical Medicine & Rehabilitation

## 2015-09-11 ENCOUNTER — Ambulatory Visit (HOSPITAL_BASED_OUTPATIENT_CLINIC_OR_DEPARTMENT_OTHER): Payer: Medicaid Other | Admitting: Physical Medicine & Rehabilitation

## 2015-09-11 VITALS — BP 121/80 | HR 78 | Resp 17

## 2015-09-11 DIAGNOSIS — M545 Low back pain: Secondary | ICD-10-CM | POA: Insufficient documentation

## 2015-09-11 DIAGNOSIS — M4328 Fusion of spine, sacral and sacrococcygeal region: Secondary | ICD-10-CM

## 2015-09-11 DIAGNOSIS — M25559 Pain in unspecified hip: Secondary | ICD-10-CM | POA: Insufficient documentation

## 2015-09-11 DIAGNOSIS — M5136 Other intervertebral disc degeneration, lumbar region: Secondary | ICD-10-CM | POA: Diagnosis not present

## 2015-09-11 DIAGNOSIS — M51369 Other intervertebral disc degeneration, lumbar region without mention of lumbar back pain or lower extremity pain: Secondary | ICD-10-CM | POA: Insufficient documentation

## 2015-09-11 DIAGNOSIS — M533 Sacrococcygeal disorders, not elsewhere classified: Secondary | ICD-10-CM | POA: Insufficient documentation

## 2015-09-11 DIAGNOSIS — G894 Chronic pain syndrome: Secondary | ICD-10-CM | POA: Insufficient documentation

## 2015-09-11 NOTE — Progress Notes (Signed)
Subjective:     Patient ID: Nathaniel Robinson, male   DOB: 10/02/1955, 60 y.o.   MRN: [id number]  HPI Pain in the low back and buttocks increases with walking for more than 30 minutes, as well as lifting heavy things. Pain is mainly on the left side. Patient has had several left sacroiliac injections, which last for a couple weeks. He's had no longer term results. He also tried left L3, L4, L5 medial branch blocks, which did not give him any relief. He does not have any pain that extends to the foot. He has no numbness, tingling or giving way of the left lower extremity. Pain Inventory Average Pain 7 Pain Right Now 6 My pain is burning  In the last 24 hours, has pain interfered with the following? General activity 5 Relation with others 5 Enjoyment of life 5 What TIME of day is your pain at its worst? morning, daytime Sleep (in general) Fair  Pain is worse with: walking, bending, standing and some activites Pain improves with: rest, medication and injections Relief from Meds: 4  Mobility walk without assistance how many minutes can you walk? 30-45 ability to climb steps?  no do you drive?  no transfers alone  Function disabled: date disabled 05/2015  Neuro/Psych No problems in this area  Prior Studies Any changes since last visit?  no CLINICAL DATA:  60 year old male with lumbar back pain for 5 years greater on the right side. Symptoms are worse in the morning. Subsequent encounter.  EXAM: MRI LUMBAR SPINE WITHOUT CONTRAST  TECHNIQUE: Multiplanar, multisequence MR imaging of the lumbar spine was performed. No intravenous contrast was administered.  COMPARISON:  Lumbar radiographs 04/29/2014. Chest radiograph 05/19/2014.  FINDINGS: Twelve full size ribs demonstrated on the recent chest radiograph. Therefore there is transitional lumbosacral anatomy demonstrated on 04/29/2014, with a partially lumbarized S1 level and full size ribs at T12. Correlation with  radiographs is recommended prior to any operative intervention.  Stable straightening of lumbar lordosis. Mild degenerative endplate changes anteriorly at L3 and L5. Minimal associated marrow edema. No acute osseous abnormality identified.  Visualized lower thoracic spinal cord is normal with conus medularis at L1.  Visualized abdominal viscera and paraspinal soft tissues are within normal limits.  T12-L1:  Mild facet and ligament flavum hypertrophy.  L1-L2:  Negative.  L2-L3:  Negative.  L3-L4: Negative disc. Mild facet hypertrophy. There is a mild degree of congenital canal narrowing due to short pedicle distance. Overall no significant stenosis.  L4-L5: A degree of congenital canal narrowing is again noted. Minimal circumferential disc bulge. Mild facet and ligament flavum hypertrophy. Mild epidural lipomatosis. Borderline to mild spinal stenosis overall.  L5-S1: Anterior eccentric circumferential disc bulge with endplate osteophytes. There is a small central posterior component of disc protrusion. The congenital canal narrowing abates at this level. Mild facet and ligament flavum hypertrophy. Borderline to mild lateral recess stenosis. No spinal or foraminal stenosis.  S1-S2: Mostly lumbarized. Negative disc. Mild facet hypertrophy. No stenosis.  IMPRESSION: 1. 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 Correlation with radiographs is recommended prior to any operative intervention. 2. Lumbar disc degeneration primarily limited to L5-S1. Small central disc protrusion and mild facet hypertrophy combine 4 mild bilateral lateral recess stenosis. 3. Mild congenital and acquired spinal stenosis at L4-L5.   Electronically Signed   By: Genevie Ann M.D.   On: 06/06/2014 08:34 Physicians involved in your care Any changes since last visit?  no Primary care .   Family History  Problem Relation Age of Onset  . Cancer Mother     "cancer  where they put baby", from description sounds uterine   . Heart disease Neg Hx   . Hypertension Neg Hx   . Diabetes Neg Hx   . Colon cancer Neg Hx    Social History   Social History  . Marital status: Married    Spouse name: N/A  . Number of children: 4  . Years of education: N/A   Occupational History  . Previous Accountant      [place]    Social History Main Topics  . Smoking status: Current Every Day Smoker    Packs/day: 0.75    Years: 25.00    Types: Cigarettes  . Smokeless tobacco: Never Used     Comment: 10 cigarettes a day   . Alcohol use 0.0 oz/week     Comment: social   . Drug use: No  . Sexual activity: No   Other Topics Concern  . None   Social History Narrative   Form [place]   Moved to [place] in 01/2014    Lives in [place] alone   Wife and 4 children in [place].    Past Surgical History:  Procedure Laterality Date  . COLONOSCOPY N/A 05/15/2015   Procedure: COLONOSCOPY;  Surgeon: Danie Binder, MD;  Location: [facility];  Service: Endoscopy;  Laterality: N/A;  145 - moved to 1:30 - office to notify  . None     Past Medical History:  Diagnosis Date  . Chronic low back pain 2010    BP 121/80   Pulse 78   Resp 17   SpO2 97%   Opioid Risk Score:   Fall Risk Score:  `1  Depression screen PHQ 2/9  Depression screen [facility] 2/9 09/08/2015 11/28/2014 08/27/2014 08/13/2014 08/12/2014 08/06/2014 05/16/2014  Decreased Interest 0 0 0 0 0 0 0  Down, Depressed, Hopeless 0 0 0 0 0 0 0  PHQ - 2 Score 0 0 0 0 0 0 0  Altered sleeping - - - - 0 - -  Tired, decreased energy - - - - 1 - -  Change in appetite - - - - 0 - -  Feeling bad or failure about yourself  - - - - 0 - -  Trouble concentrating - - - - 0 - -  Moving slowly or fidgety/restless - - - - 0 - -  Suicidal thoughts - - - - 0 - -  PHQ-9 Score - - - - 1 - -   Review of Systems  All other systems reviewed and are negative.      Objective:   Physical Exam  Constitutional: He is oriented to person,  place, and time. He appears well-developed and well-nourished.  HENT:  Head: Normocephalic and atraumatic.  Eyes: Conjunctivae and EOM are normal. Pupils are equal, round, and reactive to light.  Musculoskeletal:       Right hip: Normal.       Left hip: Normal.  Lumbar spine has good range of motion with flexion, extension, lateral bending and rotation. Patient has some tenderness in the left PSIS area as well as left gluteus medius and maximus area. No tenderness over the hips.  Neurological: He is alert and oriented to person, place, and time. He displays no atrophy. No sensory deficit. He exhibits normal muscle tone. Gait normal.  Reflex Scores:      Patellar reflexes are 2+ on the right side and 2+  on the left side.      Achilles reflexes are 2+ on the right side and 2+ on the left side. Motor strength is 5/5 bilateral hip flexor, knee extensor, ankle dorsi flexion, plantar flexor.   Psychiatric: He has a normal mood and affect.  Nursing note and vitals reviewed.  speaks through interpreter     Assessment:     Chronic left-sided low back and buttock as well as hip pain. Do not think he has any primary hip pathology. He gets some short-term relief with sacroiliac injection and indeed this could be his primary issue. Nevertheless, the patient is hampered by this problem, reviewing his MRI, may have some discogenic components with sciatic symptoms radiating into the buttock, rather than lower into the foot. Will try left L4-5, L5-S1 transforaminal injections.    Plan:     Schedule for left L4-5 and left L5-S1 transforaminal injections under fluoroscopic guidance, discussed this with the patient as well as his interpreter

## 2015-09-15 ENCOUNTER — Telehealth: Payer: Self-pay

## 2015-09-15 NOTE — Telephone Encounter (Signed)
Patient was called and informed of results on 8/1

## 2015-09-25 ENCOUNTER — Other Ambulatory Visit: Payer: Self-pay | Admitting: Family Medicine

## 2015-09-25 DIAGNOSIS — M545 Low back pain: Principal | ICD-10-CM

## 2015-09-25 DIAGNOSIS — G8929 Other chronic pain: Secondary | ICD-10-CM

## 2015-09-25 MED FILL — traMADol HCL 50 MG TABS: 50 | 30 days supply | Qty: 120 | Fill #1

## 2015-09-25 MED FILL — MELOXICAM 15 MG TABLET: 15 | 30 days supply | Qty: 30 | Fill #0

## 2015-09-28 MED FILL — CHANTIX 1 MG CONT MONTH BOX: 1 | 28 days supply | Qty: 56 | Fill #0

## 2015-10-09 ENCOUNTER — Encounter: Payer: Self-pay | Admitting: Physical Medicine & Rehabilitation

## 2015-10-09 ENCOUNTER — Ambulatory Visit (HOSPITAL_BASED_OUTPATIENT_CLINIC_OR_DEPARTMENT_OTHER): Payer: Medicaid Other | Admitting: Physical Medicine & Rehabilitation

## 2015-10-09 ENCOUNTER — Encounter: Payer: Medicaid Other | Attending: Physical Medicine & Rehabilitation

## 2015-10-09 VITALS — BP 115/78 | HR 71 | Resp 14

## 2015-10-09 DIAGNOSIS — M545 Low back pain: Secondary | ICD-10-CM | POA: Diagnosis not present

## 2015-10-09 DIAGNOSIS — M533 Sacrococcygeal disorders, not elsewhere classified: Secondary | ICD-10-CM | POA: Insufficient documentation

## 2015-10-09 DIAGNOSIS — M25559 Pain in unspecified hip: Secondary | ICD-10-CM | POA: Insufficient documentation

## 2015-10-09 DIAGNOSIS — M5416 Radiculopathy, lumbar region: Secondary | ICD-10-CM

## 2015-10-09 NOTE — Progress Notes (Signed)
  PROCEDURE RECORD [facility]   Name: Herod Connerton DOB:[birth year] MRN: [id number]  Date:10/09/2015  Physician: Alysia Penna, MD    Nurse/CMA: Christinia Lambeth, CMA  Allergies: No Known Allergies  Consent Signed: Yes.    Is patient diabetic? No.  CBG today?   Pregnant: No. LMP: No LMP for male patient. (age 59-55)  Anticoagulants: no Anti-inflammatory: no Antibiotics: no  Procedure: left L4, L5 transforaminal epidural steroid injection  Position: Prone Start Time: 1:01pm  End Time: 1:07pm       Fluoro Time: 1:03  RN/CMA Shandon Matson, CMA Camry Theiss, CMA    Time 12:45pm 1:10 pm    BP 115/78 120/75    Pulse 71 70    Respirations 14 14    O2 Sat 97 98    S/S 6 6    Pain Level 7/10 5/10     D/C home with public transportation, patient A & O X 3, D/C instructions reviewed, and sits independently.

## 2015-10-09 NOTE — Patient Instructions (Signed)

## 2015-10-09 NOTE — Progress Notes (Signed)
Left L4 and Left L5 Lumbar transforaminal epidural steroid injection under fluoroscopic guidance  Indication: Lumbosacral radiculitis is not relieved by medication management or other conservative care and interfering with self-care and mobility.   Informed consent was obtained after describing risk and benefits of the procedure with the patient, this includes bleeding, bruising, infection, paralysis and medication side effects.  The patient wishes to proceed and has given written consent.  Patient was placed in prone position.  The lumbar area was marked and prepped with Betadine.  It was entered with a 25-gauge 1-1/2 inch needle and one mL of 1% lidocaine was injected into the skin and subcutaneous tissue.  Then a 22-gauge 3.5 spinal needle was inserted into the L4-5 followed by the Left L5-S1 intervertebral foramen under AP, lateral, and oblique view.  Then a solution containing one mL of 10 mg per mL dexamethasone and 2 mL of 1% lidocaine was injected into each site.  The patient tolerated procedure well.  Post procedure instructions were given.  Please see post procedure form.

## 2015-10-29 ENCOUNTER — Encounter: Payer: Self-pay | Admitting: Family Medicine

## 2015-10-29 ENCOUNTER — Ambulatory Visit: Payer: Medicaid Other | Attending: Family Medicine | Admitting: Family Medicine

## 2015-10-29 DIAGNOSIS — M545 Low back pain, unspecified: Secondary | ICD-10-CM

## 2015-10-29 DIAGNOSIS — Z23 Encounter for immunization: Secondary | ICD-10-CM | POA: Diagnosis not present

## 2015-10-29 DIAGNOSIS — R079 Chest pain, unspecified: Secondary | ICD-10-CM | POA: Diagnosis not present

## 2015-10-29 DIAGNOSIS — R634 Abnormal weight loss: Secondary | ICD-10-CM | POA: Insufficient documentation

## 2015-10-29 DIAGNOSIS — G8929 Other chronic pain: Secondary | ICD-10-CM | POA: Diagnosis not present

## 2015-10-29 DIAGNOSIS — Z720 Tobacco use: Secondary | ICD-10-CM | POA: Diagnosis not present

## 2015-10-29 DIAGNOSIS — F17200 Nicotine dependence, unspecified, uncomplicated: Secondary | ICD-10-CM

## 2015-10-29 DIAGNOSIS — Z87891 Personal history of nicotine dependence: Secondary | ICD-10-CM | POA: Insufficient documentation

## 2015-10-29 MED ORDER — MELOXICAM 15 MG PO TABS: 15.0000 mg | ORAL_TABLET | Freq: Every day | ORAL | 2 refills | Status: DC

## 2015-10-29 MED ORDER — VARENICLINE TARTRATE 1 MG PO TABS: ORAL_TABLET | ORAL | 0 refills | Status: DC

## 2015-10-29 MED ORDER — MELOXICAM 15 MG PO TABS: 15.0000 mg | ORAL_TABLET | Freq: Every day | ORAL | 3 refills | Status: DC

## 2015-10-29 NOTE — Patient Instructions (Addendum)
Nathaniel Robinson was seen today for weight check.  Diagnoses and all orders for this visit:  Chronic low back pain -     Discontinue: meloxicam (MOBIC) 15 MG tablet; Take 1 tablet (15 mg total) by mouth daily. -     meloxicam (MOBIC) 15 MG tablet; Take 1 tablet (15 mg total) by mouth daily.  Former heavy cigarette smoker (20-39 per day)  Current smoker -     varenicline (CHANTIX CONTINUING MONTH PAK) 1 MG tablet; Taper down starting on 12/20/15 to 1 mg once daily for 7 days, then 0.5 mg once daily the STOP  Unintentional weight loss   F/u in 3 months for chronic back pain   Dr. Adrian Blackwater

## 2015-10-29 NOTE — Progress Notes (Signed)
Pt is getting flu shot today. 

## 2015-10-29 NOTE — Assessment & Plan Note (Signed)
Improving with smoking cessation and protonix Continue protonix

## 2015-10-29 NOTE — Assessment & Plan Note (Signed)
Quit smoking while taking chantix Plan to taper chantix starting 11/19/15 which is 2 months from quit date

## 2015-10-29 NOTE — Progress Notes (Signed)
Subjective:  Patient ID: Nathaniel Robinson, male    DOB: May 18, 1955  Age: 60 y.o. MRN: [id number]  CC: Weight Check [place]rinian interpreter used   HPI Nathaniel Robinson has chronic back pain, smoker, he presents for    1. Chest pain: resolved. He is taking protonix. He quit smoking 40 days ago while taking chantix. No CP, SOB or cough.   2. Unintentional weight loss: improving. He is able to eat more since taking protonix. No CP. Feeling better.   3. Chronic low back pain: pain controlled with tramadol, mobic, injections from pain management. He is happy to report that his disability has been approved.   Social History  Substance Use Topics  . Smoking status: Current Every Day Smoker    Packs/day: 0.75    Years: 25.00    Types: Cigarettes  . Smokeless tobacco: Never Used     Comment: 10 cigarettes a day   . Alcohol use 0.0 oz/week     Comment: social    Outpatient Medications Prior to Visit  Medication Sig Dispense Refill  . meloxicam (MOBIC) 15 MG tablet TAKE 1 TABLET BY MOUTH DAILY. 30 tablet 0  . pantoprazole (PROTONIX) 40 MG tablet Take 1 tablet (40 mg total) by mouth daily before supper. 90 tablet 1  . polyethylene glycol powder (GLYCOLAX/MIRALAX) powder Take 17 g by mouth daily. 3350 g 3  . traMADol (ULTRAM) 50 MG tablet TAKE 2 TABLETS BY MOUTH EVERY 12 HOURS AS NEEDED FOR MODERATE PAIN OR SEVERE PAIN 120 tablet 2  . varenicline (CHANTIX CONTINUING MONTH PAK) 1 MG tablet Take 1 tablet (1 mg total) by mouth 2 (two) times daily. For PASS 60 tablet 1  . varenicline (CHANTIX PAK) 0.5 MG X 11 & 1 MG X 42 tablet Taper per packet insert. For PASS 53 tablet 3   No facility-administered medications prior to visit.     ROS Review of Systems  Constitutional: Negative for chills, fatigue, fever and unexpected weight change.  Eyes: Negative for visual disturbance.  Respiratory: Negative for cough and shortness of breath.   Cardiovascular: Negative for chest pain, palpitations and leg  swelling.  Gastrointestinal: Negative for abdominal pain, anal bleeding, blood in stool, constipation, diarrhea, nausea, rectal pain and vomiting.  Endocrine: Negative for polydipsia, polyphagia and polyuria.  Musculoskeletal: Positive for back pain. Negative for arthralgias, gait problem, myalgias and neck pain.  Skin: Negative for rash.  Allergic/Immunologic: Negative for immunocompromised state.  Hematological: Negative for adenopathy. Does not bruise/bleed easily.  Psychiatric/Behavioral: Negative for dysphoric mood, sleep disturbance and suicidal ideas. The patient is not nervous/anxious.     Objective:  BP 126/75 (BP Location: Right Arm, Patient Position: Sitting, Cuff Size: Small)   Pulse 79   Temp 98.1 F (36.7 C) (Oral)   Ht 5\' 5"  (1.651 m)   Wt 136 lb (61.7 kg)   SpO2 97%   BMI 22.63 kg/m   BP/Weight 10/29/2015 10/09/2015 123XX123  Systolic BP 123XX123 [id number]  Diastolic BP 75 78 80  Wt. (Lbs) 136 - -  BMI 22.63 - -   Wt Readings from Last 3 Encounters:  10/29/15 136 lb (61.7 kg)  09/08/15 134 lb (60.8 kg)  06/03/15 150 lb (68 kg)    Physical Exam  Constitutional: He appears well-developed and well-nourished. No distress.  Thin   HENT:  Head: Normocephalic and atraumatic.  Neck: Normal range of motion. Neck supple.  Cardiovascular: Normal rate, regular rhythm, normal heart sounds and intact distal pulses.   Pulmonary/Chest:  Effort normal and breath sounds normal.  Abdominal: Soft. Bowel sounds are normal. He exhibits no distension and no mass. There is no tenderness. There is no rebound and no guarding.  Genitourinary: Rectum normal and prostate normal. Rectal exam shows no external hemorrhoid, no internal hemorrhoid, no fissure, anal tone normal and guaiac negative stool.  Musculoskeletal: He exhibits no edema.  Lymphadenopathy:    He has no cervical adenopathy.    He has no axillary adenopathy.  Neurological: He is alert.  Skin: Skin is warm and dry. No rash  noted. No erythema.  Psychiatric: He has a normal mood and affect.   EKG: normal EKG, normal sinus rhythm, unchanged from previous tracings. Assessment & Plan:  There are no diagnoses linked to this encounter. Problem List Items Addressed This Visit      High   Former heavy cigarette smoker (20-39 per day)   Relevant Medications   varenicline (CHANTIX CONTINUING MONTH PAK) 1 MG tablet   Chronic low back pain (Chronic)   Relevant Medications   meloxicam (MOBIC) 15 MG tablet    Other Visit Diagnoses    Current smoker  (Chronic)      Relevant Medications   varenicline (CHANTIX CONTINUING MONTH PAK) 1 MG tablet     No orders of the defined types were placed in this encounter.   Follow-up: Return in about 3 months (around 01/28/2016) for chronic low back pain and weight check .   Boykin Nearing MD

## 2015-10-30 MED FILL — MELOXICAM 15 MG TABLET: 15 | 30 days supply | Qty: 30 | Fill #0

## 2015-11-04 ENCOUNTER — Encounter (HOSPITAL_COMMUNITY): Payer: Self-pay | Admitting: Emergency Medicine

## 2015-11-04 ENCOUNTER — Emergency Department (HOSPITAL_COMMUNITY)
Admission: EM | Admit: 2015-11-04 | Discharge: 2015-11-04 | Disposition: A | Discharge status: Home / Self Care | Payer: Medicaid Other | Attending: Emergency Medicine | Admitting: Emergency Medicine

## 2015-11-04 ENCOUNTER — Emergency Department (HOSPITAL_COMMUNITY): Payer: Medicaid Other

## 2015-11-04 DIAGNOSIS — T881XXA Other complications following immunization, not elsewhere classified, initial encounter: Secondary | ICD-10-CM

## 2015-11-04 DIAGNOSIS — Y638 Failure in dosage during other surgical and medical care: Secondary | ICD-10-CM | POA: Insufficient documentation

## 2015-11-04 DIAGNOSIS — F17210 Nicotine dependence, cigarettes, uncomplicated: Secondary | ICD-10-CM | POA: Insufficient documentation

## 2015-11-04 DIAGNOSIS — T50B95A Adverse effect of other viral vaccines, initial encounter: Secondary | ICD-10-CM | POA: Insufficient documentation

## 2015-11-04 DIAGNOSIS — R51 Headache: Secondary | ICD-10-CM | POA: Diagnosis present

## 2015-11-04 LAB — CBC WITH DIFFERENTIAL/PLATELET
BASOS PCT: 1 %
Basophils Absolute: 0.1 10*3/uL (ref 0.0–0.1)
EOS ABS: 0.2 10*3/uL (ref 0.0–0.7)
EOS PCT: 2 %
HCT: 44.3 % (ref 39.0–52.0)
HEMOGLOBIN: 14.9 g/dL (ref 13.0–17.0)
Lymphocytes Relative: 33 %
Lymphs Abs: 2.7 10*3/uL (ref 0.7–4.0)
MCH: 32.7 pg (ref 26.0–34.0)
MCHC: 33.6 g/dL (ref 30.0–36.0)
MCV: 97.4 fL (ref 78.0–100.0)
Monocytes Absolute: 0.9 10*3/uL (ref 0.1–1.0)
Monocytes Relative: 11 %
NEUTROS PCT: 53 %
Neutro Abs: 4.3 10*3/uL (ref 1.7–7.7)
PLATELETS: 251 10*3/uL (ref 150–400)
RBC: 4.55 MIL/uL (ref 4.22–5.81)
RDW: 12.9 % (ref 11.5–15.5)
WBC: 8.1 10*3/uL (ref 4.0–10.5)

## 2015-11-04 LAB — I-STAT CG4 LACTIC ACID, ED: LACTIC ACID, VENOUS: 0.9 mmol/L (ref 0.5–1.9)

## 2015-11-04 LAB — BASIC METABOLIC PANEL
Anion gap: 6 (ref 5–15)
BUN: 11 mg/dL (ref 6–20)
CHLORIDE: 108 mmol/L (ref 101–111)
CO2: 25 mmol/L (ref 22–32)
CREATININE: 0.7 mg/dL (ref 0.61–1.24)
Calcium: 9.2 mg/dL (ref 8.9–10.3)
Glucose, Bld: 114 mg/dL — ABNORMAL HIGH (ref 65–99)
POTASSIUM: 4.2 mmol/L (ref 3.5–5.1)
SODIUM: 139 mmol/L (ref 135–145)

## 2015-11-04 MED ORDER — HYDROCODONE-ACETAMINOPHEN 5-325 MG PO TABS: ORAL_TABLET | ORAL | 0 refills | Status: DC

## 2015-11-04 MED ORDER — SODIUM CHLORIDE 0.9 % IV BOLUS (SEPSIS)
1000.0000 mL | Freq: Once | INTRAVENOUS | Status: AC
Start: 2015-11-04 — End: 2015-11-04
  Administered 2015-11-04: 1000 mL via INTRAVENOUS

## 2015-11-04 MED ORDER — METHYLPREDNISOLONE SODIUM SUCC 125 MG IJ SOLR
125.0000 mg | Freq: Once | INTRAMUSCULAR | Status: AC
  Administered 2015-11-04: 125 mg via INTRAVENOUS
  Filled 2015-11-04: qty 2

## 2015-11-04 MED ORDER — PROCHLORPERAZINE EDISYLATE 5 MG/ML IJ SOLN
10.0000 mg | Freq: Once | INTRAMUSCULAR | Status: AC
  Administered 2015-11-04: 10 mg via INTRAVENOUS
  Filled 2015-11-04: qty 2

## 2015-11-04 MED ORDER — DIPHENHYDRAMINE HCL 50 MG/ML IJ SOLN
25.0000 mg | Freq: Once | INTRAMUSCULAR | Status: AC
  Administered 2015-11-04: 25 mg via INTRAVENOUS
  Filled 2015-11-04: qty 1

## 2015-11-04 NOTE — ED Notes (Signed)
Patient transported to X-ray 

## 2015-11-04 NOTE — ED Triage Notes (Signed)
Pt states he got the flu vaccine 5 days ago. Since then he developed headaches, red eyes, nasal drainage and feels weak. Pt states he feels like he has the flu. Pt states he thinks he has been running a fever as well.

## 2015-11-04 NOTE — ED Notes (Signed)
Pt ambulated to room from waiting room, tolerated well. Pt also placed in gown and placed on monitor.

## 2015-11-04 NOTE — Discharge Instructions (Signed)

## 2015-11-04 NOTE — ED Provider Notes (Signed)
[facility] DEPT Provider Note   CSN: [id number] Arrival date & time: 11/04/15  [id number]     History   Chief Complaint Chief Complaint  Patient presents with  . Headache  . Nasal Congestion    HPI  Blood pressure 131/76, pulse 103, temperature 98.5 F (36.9 C), temperature source Oral, resp. rate 16, SpO2 99 %.  Nathaniel Robinson is a 60 y.o. male complaining of headache, myalgia, exacerbation of chronic low back pain, dry cough, red eye, sore throat onset 4 days ago, one day after he got his flu vaccination. He is also reports night sweats, denies easy bruising or bleeding, sick contacts, tick bites, stiff neck, nausea, vomiting, change in bowel or bladder habits, chest pain, shortness of breath, focal abdominal pain, eye discharge, change in vision, crusting upon waking.  [place] is from [place],  Editor, commissioning used for communication  HPI  Past Medical History:  Diagnosis Date  . Chronic low back pain 2010     Patient Active Problem List   Diagnosis Date Noted  . Lumbar degenerative disc disease 09/11/2015  . Chronic pain syndrome 09/11/2015  . Pain in the chest 09/08/2015  . Unintentional weight loss 09/08/2015  . 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 Former heavy cigarette smoker (20-39 per day) 03/17/2014  . Chronic low back pain     Past Surgical History:  Procedure Laterality Date  . COLONOSCOPY N/A 05/15/2015   Procedure: COLONOSCOPY;  Surgeon: Danie Binder, MD;  [facility];  Service: Endoscopy;  Laterality: N/A;  145 - moved to 1:30 - office to notify  . None         Home Medications    Prior to Admission medications   Medication Sig Start Date End Date Taking? Authorizing Provider  meloxicam (MOBIC) 15 MG tablet Take 1 tablet (15 mg total) by mouth daily. 10/29/15  Yes Boykin Nearing, MD  varenicline (CHANTIX CONTINUING MONTH PAK) 1 MG tablet Taper down starting on 12/20/15 to 1 mg once daily for 7 days,  then 0.5 mg once daily the STOP 10/29/15  Yes Josalyn Funches, MD  HYDROcodone-acetaminophen (NORCO/VICODIN) 5-325 MG tablet Take 1-2 tablets by mouth every 6 hours as needed for pain and/or cough. 11/04/15   Keonta Monceaux, PA-C  pantoprazole (PROTONIX) 40 MG tablet Take 1 tablet (40 mg total) by mouth daily before supper. Patient not taking: Reported on 11/04/2015 09/08/15   Boykin Nearing, MD  polyethylene glycol powder (GLYCOLAX/MIRALAX) powder Take 17 g by mouth daily. Patient not taking: Reported on 11/04/2015 06/03/15   Boykin Nearing, MD  traMADol (ULTRAM) 50 MG tablet TAKE 2 TABLETS BY MOUTH EVERY 12 HOURS AS NEEDED FOR MODERATE PAIN OR SEVERE PAIN Patient not taking: Reported on 11/04/2015 08/27/15   Boykin Nearing, MD    Family History Family History  Problem Relation Age of Onset  . Cancer Mother     "cancer where they put baby", from description sounds uterine   . Heart disease Neg Hx   . Hypertension Neg Hx   . Diabetes Neg Hx   . Colon cancer Neg Hx     Social History Social History  Substance Use Topics  . Smoking status: Current Every Day Smoker    Packs/day: 0.75    Years: 25.00    Types: Cigarettes    Last attempt to quit: 09/19/2015  . Smokeless tobacco: Never Used     Comment: 10 cigarettes a day   . Alcohol use 0.0  oz/week     Comment: social      Allergies   Review of patient's allergies indicates no known allergies.   Review of Systems Review of Systems  10 systems reviewed and found to be negative, except as noted in the HPI.   Physical Exam Updated Vital Signs BP 111/65   Pulse 73   Temp 98 F (36.7 C) (Oral)   Resp 16   SpO2 98%   Physical Exam  Constitutional: He is oriented to person, place, and time. He appears well-developed and well-nourished.  HENT:  Head: Normocephalic and atraumatic.  Right Ear: External ear normal.  Left Ear: External ear normal.  Mouth/Throat: Oropharynx is clear and moist. No oropharyngeal exudate.  No  drooling or stridor. Posterior pharynx mildly erythematous no significant tonsillar hypertrophy. No exudate. Soft palate rises symmetrically. No TTP or induration under tongue.   No tenderness to palpation of frontal or bilateral maxillary sinuses.  Mild mucosal edema in the nares with scant rhinorrhea.  Bilateral tympanic membranes with normal architecture and good light reflex.    Eyes: EOM are normal. Pupils are equal, round, and reactive to light.  No TTP of maxillary or frontal sinuses  No TTP or induration of temporal arteries bilaterally  Mild conjunctival injection bilaterally, no discharge  Neck: Normal range of motion. Neck supple.  FROM to C-spine. Pt can touch chin to chest without discomfort. No TTP of midline cervical spine.   Cardiovascular: Normal rate, regular rhythm and intact distal pulses.   Pulmonary/Chest: Effort normal and breath sounds normal. No stridor. No respiratory distress. He has no wheezes. He has no rales. He exhibits no tenderness.  Abdominal: Soft. Bowel sounds are normal. There is no tenderness. There is no rebound and no guarding.  Musculoskeletal: Normal range of motion. He exhibits no edema or tenderness.  Neurological: He is alert and oriented to person, place, and time. No cranial nerve deficit.  II-Visual fields grossly intact. III/IV/VI-Extraocular movements intact.  Pupils reactive bilaterally. V/VII-Smile symmetric, equal eyebrow raise,  facial sensation intact VIII- Hearing grossly intact IX/X-Normal gag XI-bilateral shoulder shrug XII-midline tongue extension Motor: 5/5 bilaterally with normal tone and bulk Cerebellar: Normal finger-to-nose  and normal heel-to-shin test.   Romberg negative Ambulates with a coordinated gait   Nursing note and vitals reviewed.    ED Treatments / Results  Labs (all labs ordered are listed, but only abnormal results are displayed) Labs Reviewed  BASIC METABOLIC PANEL - Abnormal; Notable for the  following:       Result Value   Glucose, Bld 114 (*)    All other components within normal limits  CBC WITH DIFFERENTIAL/PLATELET  URINALYSIS, ROUTINE W REFLEX MICROSCOPIC (NOT AT [facility])  I-STAT CG4 LACTIC ACID, ED  I-STAT CG4 LACTIC ACID, ED    EKG  EKG Interpretation None       Radiology Dg Chest 2 View  Result Date: 11/04/2015 CLINICAL DATA:  Headache.  Fever. EXAM: CHEST  2 VIEW COMPARISON:  08/27/2014. FINDINGS: Mediastinum and hilar structures are normal. Low lung volumes with mild bibasilar atelectasis the. no pleural effusion pneumothorax. Heart size normal . No acute bony abnormality. IMPRESSION: Low lung volumes with mild bibasilar atelectasis. Electronically Signed   By: Marcello Moores  Register   On: 11/04/2015 11:05    Procedures Procedures (including critical care time)  Medications Ordered in ED Medications  sodium chloride 0.9 % bolus 1,000 mL (0 mLs Intravenous Stopped 11/04/15 1121)  diphenhydrAMINE (BENADRYL) injection 25 mg (25 mg Intravenous Given  11/04/15 1036)  prochlorperazine (COMPAZINE) injection 10 mg (10 mg Intravenous Given 11/04/15 1036)  methylPREDNISolone sodium succinate (SOLU-MEDROL) 125 mg/2 mL injection 125 mg (125 mg Intravenous Given 11/04/15 1036)     Initial Impression / Assessment and Plan / ED Course  I have reviewed the triage vital signs and the nursing notes.  Pertinent labs & imaging results that were available during my care of the patient were reviewed by me and considered in my medical decision making (see chart for details).  Clinical Course    Vitals:   11/04/15 1000 11/04/15 1045 11/04/15 1137 11/04/15 1145  BP: 107/66 108/67 105/65 111/65  Pulse: 89 76 78 73  Resp:   16   Temp:   98 F (36.7 C)   TempSrc:   Oral   SpO2: 97% 100% 100% 98%    Medications  sodium chloride 0.9 % bolus 1,000 mL (0 mLs Intravenous Stopped 11/04/15 1121)  diphenhydrAMINE (BENADRYL) injection 25 mg (25 mg Intravenous Given 11/04/15 1036)    prochlorperazine (COMPAZINE) injection 10 mg (10 mg Intravenous Given 11/04/15 1036)  methylPREDNISolone sodium succinate (SOLU-MEDROL) 125 mg/2 mL injection 125 mg (125 mg Intravenous Given 11/04/15 1036)    Nathaniel Robinson is 60 y.o. male presenting with Flulike symptoms of headache, sore throat, myalgia status post getting his flu vaccination 5 days ago. Patient is afebrile with a mild tachycardia of 103. I think this is likely a postvaccination reaction. Lung sounds are clear to auscultation, he is saturating well on room air, blood work including lactate is reassuring. Chest x-rays without infiltrate. No comorbidities that would require Tamiflu for influenza. Patient reports significant improvement of headache status post headache cocktail. Will write him prescription for Vicodin for cough and headache, advised him to rest, push fluids and with adnexa to discussion of return precautions.  Evaluation does not show pathology that would require ongoing emergent intervention or inpatient treatment. Pt is hemodynamically stable and mentating appropriately. Discussed findings and plan with patient/guardian, who agrees with care plan. All questions answered. Return precautions discussed and outpatient follow up given.    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

## 2015-11-05 ENCOUNTER — Ambulatory Visit (HOSPITAL_BASED_OUTPATIENT_CLINIC_OR_DEPARTMENT_OTHER): Payer: Medicaid Other | Admitting: Physical Medicine & Rehabilitation

## 2015-11-05 ENCOUNTER — Encounter: Payer: Medicaid Other | Attending: Physical Medicine & Rehabilitation

## 2015-11-05 ENCOUNTER — Encounter: Payer: Self-pay | Admitting: Physical Medicine & Rehabilitation

## 2015-11-05 VITALS — BP 116/72 | HR 80

## 2015-11-05 DIAGNOSIS — M25559 Pain in unspecified hip: Secondary | ICD-10-CM | POA: Diagnosis not present

## 2015-11-05 DIAGNOSIS — M5136 Other intervertebral disc degeneration, lumbar region: Secondary | ICD-10-CM | POA: Diagnosis not present

## 2015-11-05 DIAGNOSIS — M533 Sacrococcygeal disorders, not elsewhere classified: Secondary | ICD-10-CM | POA: Diagnosis present

## 2015-11-05 DIAGNOSIS — M5416 Radiculopathy, lumbar region: Secondary | ICD-10-CM

## 2015-11-05 DIAGNOSIS — M545 Low back pain: Secondary | ICD-10-CM | POA: Insufficient documentation

## 2015-11-05 NOTE — Progress Notes (Signed)
Subjective:    Patient ID: Nathaniel Robinson, male    DOB: 1955/03/07, 60 y.o.   MRN: [id number]  HPI 60 year old [place] male with a six-year history of low back pain starting after he lifted a large rock. He had some treatment in [place] with a series of 5 injections which were performed on a daily basis. He has no history of back surgery. No bowel or bladder issues. No leg weakness. MRI showed a partially sacralized left L5 transverse process. No compressive lesions otherwise. He received Toradol injection, then sacroiliac injection. He also received a trigger point injection into the left gluteus medius These procedures were not particularly effective. He then underwent a left L4 and left L5 transforaminal epidural steroid injection performed on 10/09/2015. His pain scores have dropped from 8 out of 10-4 out of 10. His pain relief has persisted No current pain going down into the left leg  Standing tolerance is 1 hour  Pain Inventory Average Pain 4 Pain Right Now 4 My pain is burning  In the last 24 hours, has pain interfered with the following? General activity 4 Relation with others 4 Enjoyment of life 5 What TIME of day is your pain at its worst? morning, daytime Sleep (in general) Fair  Pain is worse with: walking, bending, standing and some activites Pain improves with: rest Relief from Meds: 4  Mobility walk without assistance how many minutes can you walk? 30-60 ability to climb steps?  no do you drive?  no  Function disabled: date disabled .  Neuro/Psych No problems in this area  Prior Studies Any changes since last visit?  no  Physicians involved in your care Any changes since last visit?  no   Family History  Problem Relation Age of Onset  . Cancer Mother     "cancer where they put baby", from description sounds uterine   . Heart disease Neg Hx   . Hypertension Neg Hx   . Diabetes Neg Hx   . Colon cancer Neg Hx    Social History   Social  History  . Marital status: Married    Spouse name: N/A  . Number of children: 4  . Years of education: N/A   Occupational History  . Previous Accountant      [place]    Social History Main Topics  . Smoking status: Current Every Day Smoker    Packs/day: 0.75    Years: 25.00    Types: Cigarettes    Last attempt to quit: 09/19/2015  . Smokeless tobacco: Never Used     Comment: 10 cigarettes a day   . Alcohol use 0.0 oz/week     Comment: social   . Drug use: No  . Sexual activity: No   Other Topics Concern  . None   Social History Narrative   Form [place]   Moved to [place] in 01/2014    Lives in [place] alone   Wife and 4 children in [place].    Past Surgical History:  Procedure Laterality Date  . COLONOSCOPY N/A 05/15/2015   Procedure: COLONOSCOPY;  Surgeon: Danie Binder, MD;  Location[facility];  Service: Endoscopy;  Laterality: N/A;  145 - moved to 1:30 - office to notify  . None     Past Medical History:  Diagnosis Date  . Chronic low back pain 2010    BP 116/72 (BP Location: Left Arm, Patient Position: Sitting, Cuff Size: Normal)   Pulse 80   SpO2 98%  Opioid Risk Score:   Fall Risk Score:  `1  Depression screen PHQ 2/9  Depression screen [facility] 2/9 09/08/2015 11/28/2014 08/27/2014 08/13/2014 08/12/2014 08/06/2014 05/16/2014  Decreased Interest 0 0 0 0 0 0 0  Down, Depressed, Hopeless 0 0 0 0 0 0 0  PHQ - 2 Score 0 0 0 0 0 0 0  Altered sleeping - - - - 0 - -  Tired, decreased energy - - - - 1 - -  Change in appetite - - - - 0 - -  Feeling bad or failure about yourself  - - - - 0 - -  Trouble concentrating - - - - 0 - -  Moving slowly or fidgety/restless - - - - 0 - -  Suicidal thoughts - - - - 0 - -  PHQ-9 Score - - - - 1 - -    Review of Systems  Constitutional: Negative.   HENT: Negative.   Eyes: Negative.   Respiratory: Negative.   Cardiovascular: Negative.   Gastrointestinal: Negative.   Endocrine: Negative.   Genitourinary: Negative.     Musculoskeletal: Positive for back pain.  Skin: Negative.   Allergic/Immunologic: Negative.   Neurological: Negative.   Hematological: Negative.   Psychiatric/Behavioral: Negative.        Objective:   Physical Exam  Constitutional: He is oriented to person, place, and time. He appears well-developed and well-nourished.  HENT:  Head: Normocephalic and atraumatic.  Eyes: Conjunctivae and EOM are normal. Pupils are equal, round, and reactive to light.  Neck: Normal range of motion.  Musculoskeletal:       Lumbar back: He exhibits tenderness. He exhibits normal range of motion and no deformity.  The lumbar spine deformities. He has mild tenderness palpation left PSIS/left gluteus medius area  Neurological: He is alert and oriented to person, place, and time. No sensory deficit. Gait normal.  Reflex Scores:      Patellar reflexes are 2+ on the right side and 2+ on the left side.      Achilles reflexes are 2+ on the right side and 2+ on the left side. Motor strength is 5/5 bilateral hip flexor, knee extensor, ankle dorsiflexor. Negative straight leg raising Lower extremity sensory intact to light touch  Psychiatric: He has a normal mood and affect.  Nursing note and vitals reviewed.         Assessment & Plan:  1. Chronic left buttock pain. From a clinical standpoint, initially suspected sacroiliac pain generator. However, this was not very helpful. Lumbar transforaminal injections were, however more helpful and, as discussed with patient may last for weeks or months. He wishes to have repeat injection in 2 more months. He may need this on a chronic basis. 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  Discussed with patient as well as interpreter

## 2015-11-05 NOTE — Patient Instructions (Signed)
Will repeat same injection in 2 months

## 2015-11-30 MED FILL — MELOXICAM 15 MG TABLET: 15 | 90 days supply | Qty: 90 | Fill #0

## 2016-01-05 ENCOUNTER — Ambulatory Visit (HOSPITAL_BASED_OUTPATIENT_CLINIC_OR_DEPARTMENT_OTHER): Payer: Medicaid Other | Admitting: Physical Medicine & Rehabilitation

## 2016-01-05 ENCOUNTER — Encounter: Payer: Self-pay | Admitting: Physical Medicine & Rehabilitation

## 2016-01-05 ENCOUNTER — Encounter: Payer: Medicaid Other | Attending: Physical Medicine & Rehabilitation

## 2016-01-05 VITALS — BP 134/76 | HR 74

## 2016-01-05 DIAGNOSIS — M5416 Radiculopathy, lumbar region: Secondary | ICD-10-CM | POA: Diagnosis not present

## 2016-01-05 DIAGNOSIS — M545 Low back pain: Secondary | ICD-10-CM | POA: Insufficient documentation

## 2016-01-05 DIAGNOSIS — M25559 Pain in unspecified hip: Secondary | ICD-10-CM | POA: Insufficient documentation

## 2016-01-05 DIAGNOSIS — M533 Sacrococcygeal disorders, not elsewhere classified: Secondary | ICD-10-CM | POA: Insufficient documentation

## 2016-01-05 NOTE — Progress Notes (Signed)
  [facility]   Name: Nathaniel Robinson DOB:[birth year] MRN: [id number]  Date:01/05/2016  Physician: Alysia Penna, MD    Nurse/CMA: Yolanda Bonine, CMA  Allergies: No Known Allergies  Consent Signed: Yes.    Is patient diabetic? No.  CBG today? n/a  Pregnant: No. LMP: No LMP for male patient. (age 59-55)  Anticoagulants: no Anti-inflammatory: yes (meloxicam 8:00am) Antibiotics: no  Procedure: left transforaminal epidural steroid injection  Position: Prone Start Time:1029   End Time: 1036  Fluoro Time: 31  RN/CMA Shandy Checo, CMA Haynes, CMA    Time 9:50am 10:40am    BP 134/76 118/70    Pulse 74 71    Respirations 14 14    O2 Sat 99 97    S/S 6 6    Pain Level 7/10 5     D/C home with public transportation, patient A & O X 3, D/C instructions reviewed, and sits independently.

## 2016-01-05 NOTE — Patient Instructions (Signed)
Did L4 and L5 injection today

## 2016-01-05 NOTE — Progress Notes (Signed)
LEFT L4 and LEFT L5 Lumbar transforaminal epidural steroid injections under fluoroscopic guidance  Indication: Lumbosacral radiculitis is not relieved by medication management or other conservative care and interfering with self-care and mobility.   Informed consent was obtained after describing risk and benefits of the procedure with the patient, this includes bleeding, bruising, infection, paralysis and medication side effects.  The patient wishes to proceed and has given written consent.  Patient was placed in prone position.  The lumbar area was marked and prepped with Betadine.  It was entered with a 25-gauge 1-1/2 inch needle and one mL of 1% lidocaine was injected into the skin and subcutaneous tissue.  Then a 22-gauge 3.5 in spinal needle was inserted into the Left L4-5 intervertebral foramen under AP, lateral, and oblique view.  Then a solution containing one mL of 10 mg per mL dexamethasone and 2 mL of 1% lidocaine was injected. Then the same procedure was repeated at left L5-S1. Using same needle, injectate and technique. The patient tolerated procedure well.  Post procedure instructions were given.  Please see post procedure form. 

## 2016-02-29 ENCOUNTER — Ambulatory Visit: Payer: Medicaid Other | Admitting: Family Medicine

## 2016-03-10 ENCOUNTER — Ambulatory Visit: Payer: Medicaid Other | Attending: Family Medicine | Admitting: Family Medicine

## 2016-03-10 ENCOUNTER — Encounter: Payer: Self-pay | Admitting: Family Medicine

## 2016-03-10 VITALS — BP 120/77 | HR 75 | Temp 97.9°F | Wt 138.0 lb

## 2016-03-10 DIAGNOSIS — F17210 Nicotine dependence, cigarettes, uncomplicated: Secondary | ICD-10-CM | POA: Insufficient documentation

## 2016-03-10 DIAGNOSIS — Z79899 Other long term (current) drug therapy: Secondary | ICD-10-CM | POA: Insufficient documentation

## 2016-03-10 DIAGNOSIS — Z0000 Encounter for general adult medical examination without abnormal findings: Secondary | ICD-10-CM

## 2016-03-10 DIAGNOSIS — G8929 Other chronic pain: Secondary | ICD-10-CM | POA: Diagnosis not present

## 2016-03-10 DIAGNOSIS — M545 Low back pain, unspecified: Secondary | ICD-10-CM

## 2016-03-10 DIAGNOSIS — E7800 Pure hypercholesterolemia, unspecified: Secondary | ICD-10-CM | POA: Diagnosis not present

## 2016-03-10 DIAGNOSIS — Z87891 Personal history of nicotine dependence: Secondary | ICD-10-CM

## 2016-03-10 LAB — LIPID PANEL
Cholesterol: 207 mg/dL — ABNORMAL HIGH (ref <200)
HDL: 59 mg/dL (ref >40)
LDL CALC: 123 mg/dL — AB (ref <100)
Total CHOL/HDL Ratio: 3.5 Ratio (ref <5.0)
Triglycerides: 126 mg/dL (ref <150)
VLDL: 25 mg/dL (ref <30)

## 2016-03-10 MED ORDER — MELOXICAM 15 MG PO TABS: 15.0000 mg | ORAL_TABLET | Freq: Every day | ORAL | 5 refills | Status: DC

## 2016-03-10 MED ORDER — BUPROPION HCL ER (XL) 150 MG PO TB24: 150.0000 mg | ORAL_TABLET | Freq: Every day | ORAL | 2 refills | Status: DC

## 2016-03-10 MED ORDER — TRAMADOL HCL 50 MG PO TABS: 50.0000 mg | ORAL_TABLET | Freq: Two times a day (BID) | ORAL | 2 refills | Status: DC | PRN

## 2016-03-10 MED FILL — BUPROPION HCL XL 150 MG TAB: 150 | 30 days supply | Qty: 30 | Fill #0

## 2016-03-10 MED FILL — traMADol HCL 50 MG TABS: 50 | 30 days supply | Qty: 60 | Fill #0

## 2016-03-10 MED FILL — MELOXICAM 15 MG TABLET: 15 | 30 days supply | Qty: 30 | Fill #0

## 2016-03-10 NOTE — Patient Instructions (Signed)
Ezkiel was seen today for back pain.  Diagnoses and all orders for this visit:  Chronic left-sided low back pain without sciatica -     meloxicam (MOBIC) 15 MG tablet; Take 1 tablet (15 mg total) by mouth daily. -     traMADol (ULTRAM) 50 MG tablet; Take 1 tablet (50 mg total) by mouth every 12 (twelve) hours as needed.  Healthcare maintenance -     Lipid Panel  Former heavy cigarette smoker (20-39 per day) -     buPROPion (WELLBUTRIN XL) 150 MG 24 hr tablet; Take 1 tablet (150 mg total) by mouth daily.  start Wellbutrin to prevent smoking cravings  F/u in 4 weeks for wellness physical/ f/u smoking cravings   Dr. Adrian Blackwater

## 2016-03-10 NOTE — Progress Notes (Signed)
Subjective:  Patient ID: Nathaniel Robinson, male    DOB: 1955-04-25  Age: 61 y.o. MRN: [id number]  CC: Back Pain   HPI Nathaniel Robinson presents for    1.  Back pain: chronic low back pain. Patient established with pain management where he receives lumbar injections every 3 months. He reports injections provide relief for only 45 days. He would like to add back tramdaol for pain relief. His current pain level is 6-7/10. He has pain when walking uphill and on flat surfaces. His pain does not radiate down his legs. He walks unassisted.  2. Smoking: he quit smoking 4 months ago with chantix. He has some cravings at times. Depressed mood at times. He is not smoking but fears he will start.   3. Wellness physical: he received a letter stating that he is due for wellness physical.   Social History  Substance Use Topics  . Smoking status: Current Every Day Smoker    Packs/day: 0.75    Years: 25.00    Types: Cigarettes    Last attempt to quit: 09/19/2015  . Smokeless tobacco: Never Used     Comment: 10 cigarettes a day   . Alcohol use 0.0 oz/week     Comment: social     Outpatient Medications Prior to Visit  Medication Sig Dispense Refill  . meloxicam (MOBIC) 15 MG tablet Take 1 tablet (15 mg total) by mouth daily. 90 tablet 3  . varenicline (CHANTIX CONTINUING MONTH PAK) 1 MG tablet Taper down starting on 12/20/15 to 1 mg once daily for 7 days, then 0.5 mg once daily the STOP 11 tablet 0   No facility-administered medications prior to visit.     ROS Review of Systems  Constitutional: Negative for chills, fatigue, fever and unexpected weight change.  Eyes: Negative for visual disturbance.  Respiratory: Negative for cough and shortness of breath.   Cardiovascular: Negative for chest pain, palpitations and leg swelling.  Gastrointestinal: Negative for abdominal pain, anal bleeding, blood in stool, constipation, diarrhea, nausea, rectal pain and vomiting.  Endocrine: Negative for polydipsia,  polyphagia and polyuria.  Musculoskeletal: Positive for back pain. Negative for arthralgias, gait problem, myalgias and neck pain.  Skin: Negative for rash.  Allergic/Immunologic: Negative for immunocompromised state.  Hematological: Negative for adenopathy. Does not bruise/bleed easily.  Psychiatric/Behavioral: Positive for dysphoric mood. Negative for sleep disturbance and suicidal ideas. The patient is not nervous/anxious.     Objective:  BP 120/77 (BP Location: Left Arm, Patient Position: Sitting, Cuff Size: Small)   Pulse 75   Temp 97.9 F (36.6 C) (Oral)   Wt 138 lb (62.6 kg)   SpO2 100%   BMI 22.96 kg/m   BP/Weight 03/10/2016 01/05/2016 123456  Systolic BP 123456 [id number]  Diastolic BP 77 76 72  Wt. (Lbs) 138 - -  BMI 22.96 - -   Wt Readings from Last 3 Encounters:  03/10/16 138 lb (62.6 kg)  10/29/15 136 lb (61.7 kg)  09/08/15 134 lb (60.8 kg)    Physical Exam  Constitutional: He appears well-developed and well-nourished. No distress.  HENT:  Head: Normocephalic and atraumatic.  Neck: Normal range of motion. Neck supple.  Cardiovascular: Normal rate, regular rhythm, normal heart sounds and intact distal pulses.   Pulmonary/Chest: Effort normal and breath sounds normal.  Musculoskeletal: He exhibits no edema.       Back:  Neurological: He is alert.  Skin: Skin is warm and dry. No rash noted. No erythema.  Psychiatric: He has a  normal mood and affect.   Depression screen [facility] 2/9 03/10/2016 09/08/2015 11/28/2014 08/27/2014 08/13/2014  Decreased Interest 0 0 0 0 0  Down, Depressed, Hopeless 0 0 0 0 0  PHQ - 2 Score 0 0 0 0 0  Altered sleeping 0 - - - -  Tired, decreased energy 0 - - - -  Change in appetite 0 - - - -  Feeling bad or failure about yourself  0 - - - -  Trouble concentrating 0 - - - -  Moving slowly or fidgety/restless 0 - - - -  Suicidal thoughts 0 - - - -  PHQ-9 Score 0 - - - -   GAD 7 : Generalized Anxiety Score 03/10/2016 09/08/2015  Nervous,  Anxious, on Edge 0 0  Control/stop worrying 0 0  Worry too much - different things 0 0  Trouble relaxing 0 0  Restless 0 0  Easily annoyed or irritable 0 0  Afraid - awful might happen 0 0  Total GAD 7 Score 0 0  Anxiety Difficulty - Not difficult at all     Assessment & Plan:  Nathaniel Robinson was seen today for back pain.  Diagnoses and all orders for this visit:  Chronic left-sided low back pain without sciatica -     meloxicam (MOBIC) 15 MG tablet; Take 1 tablet (15 mg total) by mouth daily. -     traMADol (ULTRAM) 50 MG tablet; Take 1 tablet (50 mg total) by mouth every 12 (twelve) hours as needed.  Healthcare maintenance -     Lipid Panel  Former heavy cigarette smoker (20-39 per day) -     buPROPion (WELLBUTRIN XL) 150 MG 24 hr tablet; Take 1 tablet (150 mg total) by mouth daily.   There are no diagnoses linked to this encounter.  No orders of the defined types were placed in this encounter.   Follow-up: Return in about 4 weeks (around 04/07/2016) for wellness physcal and f/u smoking cravings .   Nathaniel Nearing MD

## 2016-03-10 NOTE — Assessment & Plan Note (Signed)
Having cravings Depressed mood at times  Add wellbutrin 150 mg daily

## 2016-03-10 NOTE — Assessment & Plan Note (Signed)
Chronic pain Add tramadol Add mobic Continue q 3 month injections

## 2016-03-14 DIAGNOSIS — E785 Hyperlipidemia, unspecified: Secondary | ICD-10-CM | POA: Insufficient documentation

## 2016-03-14 MED ORDER — ATORVASTATIN CALCIUM 20 MG PO TABS: 20.0000 mg | ORAL_TABLET | Freq: Every day | ORAL | 11 refills | Status: DC

## 2016-03-14 NOTE — Addendum Note (Signed)
Addended by: Boykin Nearing on: 03/14/2016 08:47 AM   Modules accepted: Orders

## 2016-03-14 NOTE — Assessment & Plan Note (Signed)
Total cholesterol and LDL are a bit elevated.  Patient does not have HTN or diabetes and he no longer smokes. I recommend statin therapy (moderate intensity) lipitor 20 mg would reduce cholesterol and overall heart disease risk  I also recommend healthy low sugar ( < 30 grams per day), increase exercise as tolerated by back    Regarding aspirin, patient on mobic for chronic back pain. He is at elevated risk for GI bleeding. His risk declines with addition of PPI for GI prophylaxis. For now, will not recommend aspirin therapy.

## 2016-03-23 ENCOUNTER — Telehealth: Payer: Self-pay

## 2016-03-23 MED FILL — ATORVASTATIN 20 MG TABLET: 20 | 30 days supply | Qty: 30 | Fill #0

## 2016-03-23 NOTE — Telephone Encounter (Signed)
Pt was called and informed of lab results and medication being sent to onsite pharmacy.

## 2016-04-07 ENCOUNTER — Ambulatory Visit: Payer: Medicaid Other | Admitting: Physical Medicine & Rehabilitation

## 2016-04-14 MED FILL — MELOXICAM 15 MG TABLET: 15 | 30 days supply | Qty: 30 | Fill #1

## 2016-04-14 MED FILL — traMADol HCL 50 MG TABS: 50 | 30 days supply | Qty: 60 | Fill #1

## 2016-04-14 MED FILL — BUPROPION HCL XL 150 MG TAB: 150 | 30 days supply | Qty: 30 | Fill #1

## 2016-04-29 ENCOUNTER — Ambulatory Visit: Payer: Medicaid Other | Attending: Family Medicine | Admitting: Family Medicine

## 2016-04-29 ENCOUNTER — Encounter: Payer: Self-pay | Admitting: Family Medicine

## 2016-04-29 VITALS — BP 106/69 | HR 72 | Temp 97.7°F | Wt 139.6 lb

## 2016-04-29 DIAGNOSIS — E785 Hyperlipidemia, unspecified: Secondary | ICD-10-CM | POA: Insufficient documentation

## 2016-04-29 DIAGNOSIS — M545 Low back pain, unspecified: Secondary | ICD-10-CM

## 2016-04-29 DIAGNOSIS — Z0000 Encounter for general adult medical examination without abnormal findings: Secondary | ICD-10-CM

## 2016-04-29 DIAGNOSIS — G8929 Other chronic pain: Secondary | ICD-10-CM | POA: Diagnosis not present

## 2016-04-29 DIAGNOSIS — Z87891 Personal history of nicotine dependence: Secondary | ICD-10-CM | POA: Diagnosis not present

## 2016-04-29 DIAGNOSIS — Z79899 Other long term (current) drug therapy: Secondary | ICD-10-CM | POA: Insufficient documentation

## 2016-04-29 LAB — POCT URINALYSIS DIPSTICK
Glucose, UA: NEGATIVE
KETONES UA: 15
LEUKOCYTES UA: NEGATIVE
Nitrite, UA: NEGATIVE
PH UA: 7.5 (ref 5.0–8.0)
RBC UA: NEGATIVE
SPEC GRAV UA: 1.02 (ref 1.030–1.035)
Urobilinogen, UA: 2 — AB (ref ?–2.0)

## 2016-04-29 LAB — HEMOCCULT GUIAC POC 1CARD (OFFICE): Fecal Occult Blood, POC: NEGATIVE

## 2016-04-29 MED ORDER — BUPROPION HCL ER (XL) 150 MG PO TB24: 150.0000 mg | ORAL_TABLET | Freq: Every day | ORAL | 2 refills | Status: DC

## 2016-04-29 MED ORDER — TRAMADOL HCL 50 MG PO TABS: 50.0000 mg | ORAL_TABLET | Freq: Two times a day (BID) | ORAL | 2 refills | Status: DC | PRN

## 2016-04-29 MED FILL — ATORVASTATIN 20 MG TABLET: 20 | 30 days supply | Qty: 30 | Fill #1

## 2016-04-29 NOTE — Patient Instructions (Addendum)
Nathaniel Robinson was seen today for annual exam.  Diagnoses and all orders for this visit:  Well adult health check -     POCT urinalysis dipstick -     Hemoccult - 1 Card (office) -     Ambulatory referral to Dentistry  Former heavy cigarette smoker (20-39 per day) -     buPROPion (WELLBUTRIN XL) 150 MG 24 hr tablet; Take 1 tablet (150 mg total) by mouth daily.  Chronic left-sided low back pain without sciatica -     traMADol (ULTRAM) 50 MG tablet; Take 1 tablet (50 mg total) by mouth every 12 (twelve) hours as needed.   F/u in 3 months for chronic low back pain   Dr. Adrian Blackwater    Fat and Cholesterol Restricted Diet Getting too much fat and cholesterol in your diet may cause health problems. Following this diet helps keep your fat and cholesterol at normal levels. This can keep you from getting sick. What types of fat should I choose?  Choose monosaturated and polyunsaturated fats. These are found in foods such as olive oil, canola oil, flaxseeds, walnuts, almonds, and seeds.  Eat more omega-3 fats. Good choices include salmon, mackerel, sardines, tuna, flaxseed oil, and ground flaxseeds.  Limit saturated fats. These are in animal products such as meats, butter, and cream. They can also be in plant products such as palm oil, palm kernel oil, and coconut oil.  Avoid foods with partially hydrogenated oils in them. These contain trans fats. Examples of foods that have trans fats are stick margarine, some tub margarines, cookies, crackers, and other baked goods. What general guidelines do I need to follow?  Check food labels. Look for the words "trans fat" and "saturated fat."  When preparing a meal:  Fill half of your plate with vegetables and green salads.  Fill one fourth of your plate with whole grains. Look for the word "whole" as the first word in the ingredient list.  Fill one fourth of your plate with lean protein foods.  Eat more foods that have fiber, like apples, carrots,  beans, peas, and barley.  Eat more home-cooked foods. Eat less at restaurants and buffets.  Limit or avoid alcohol.  Limit foods high in starch and sugar.  Limit fried foods.  Cook foods without frying them. Baking, boiling, grilling, and broiling are all great options.  Lose weight if you are overweight. Losing even a small amount of weight can help your overall health. It can also help prevent diseases such as diabetes and heart disease. What foods can I eat? Grains  Whole grains, such as whole wheat or whole grain breads, crackers, cereals, and pasta. Unsweetened oatmeal, bulgur, barley, quinoa, or brown rice. Corn or whole wheat flour tortillas. Vegetables  Fresh or frozen vegetables (raw, steamed, roasted, or grilled). Green salads. Fruits  All fresh, canned (in natural juice), or frozen fruits. Meat and Other Protein Products  Ground beef (85% or leaner), grass-fed beef, or beef trimmed of fat. Skinless chicken or [place]. Ground chicken or [place]. Pork trimmed of fat. All fish and seafood. Eggs. Dried beans, peas, or lentils. Unsalted nuts or seeds. Unsalted canned or dry beans. Dairy  Low-fat dairy products, such as skim or 1% milk, 2% or reduced-fat cheeses, low-fat ricotta or cottage cheese, or plain low-fat yogurt. Fats and Oils  Tub margarines without trans fats. Light or reduced-fat mayonnaise and salad dressings. Avocado. Olive, canola, sesame, or safflower oils. Natural peanut or almond butter (choose ones without added sugar and  oil). The items listed above may not be a complete list of recommended foods or beverages. Contact your dietitian for more options.  What foods are not recommended? Grains  White bread. White pasta. White rice. Cornbread. Bagels, pastries, and croissants. Crackers that contain trans fat. Vegetables  White potatoes. Corn. Creamed or fried vegetables. Vegetables in a cheese sauce. Fruits  Dried fruits. Canned fruit in light or heavy syrup. Fruit  juice. Meat and Other Protein Products  Fatty cuts of meat. Ribs, chicken wings, bacon, sausage, bologna, salami, chitterlings, fatback, hot dogs, bratwurst, and packaged luncheon meats. Liver and organ meats. Dairy  Whole or 2% milk, cream, half-and-half, and cream cheese. Whole milk cheeses. Whole-fat or sweetened yogurt. Full-fat cheeses. Nondairy creamers and whipped toppings. Processed cheese, cheese spreads, or cheese curds. Sweets and Desserts  Corn syrup, sugars, honey, and molasses. Candy. Jam and jelly. Syrup. Sweetened cereals. Cookies, pies, cakes, donuts, muffins, and ice cream. Fats and Oils  Butter, stick margarine, lard, shortening, ghee, or bacon fat. Coconut, palm kernel, or palm oils. Beverages  Alcohol. Sweetened drinks (such as sodas, lemonade, and fruit drinks or punches). The items listed above may not be a complete list of foods and beverages to avoid. Contact your dietitian for more information.  This information is not intended to replace advice given to you by your health care provider. Make sure you discuss any questions you have with your health care provider. Document Released: 08/02/2011 Document Revised: 10/08/2015 Document Reviewed: 05/02/2013 Elsevier Interactive Patient Education  2017 [employer].

## 2016-04-29 NOTE — Progress Notes (Signed)
SUBJECTIVE:  Nathaniel Robinson is a 61 y.o. male is a former smoker, has HLD, has chronic low back pain he  presenting for his annual checkup.  Social History  Substance Use Topics  . Smoking status: Former Smoker    Packs/day: 0.75    Years: 25.00    Types: Cigarettes    Quit date: 09/19/2015  . Smokeless tobacco: Never Used     Comment: 10 cigarettes a day   . Alcohol use 0.0 oz/week     Comment: social    Current Outpatient Prescriptions  Medication Sig Dispense Refill  . atorvastatin (LIPITOR) 20 MG tablet Take 1 tablet (20 mg total) by mouth daily. 30 tablet 11  . buPROPion (WELLBUTRIN XL) 150 MG 24 hr tablet Take 1 tablet (150 mg total) by mouth daily. 30 tablet 2  . meloxicam (MOBIC) 15 MG tablet Take 1 tablet (15 mg total) by mouth daily. 30 tablet 5  . traMADol (ULTRAM) 50 MG tablet Take 1 tablet (50 mg total) by mouth every 12 (twelve) hours as needed. 60 tablet 2   No current facility-administered medications for this visit.    Allergies: Patient has no known allergies.   ROS:  Feeling well. No dyspnea or chest pain on exertion. No abdominal pain, change in bowel habits, black or bloody stools. No urinary tract or prostatic symptoms. No neurological complaints. Chronic L sided low back pain 4-5/10.   OBJECTIVE:  The patient appears well, alert, oriented x 3, in no distress.  BP 106/69   Pulse 72   Temp 97.7 F (36.5 C) (Oral)   Wt 139 lb 9.6 oz (63.3 kg)   SpO2 98%   BMI 23.23 kg/m   Wt Readings from Last 3 Encounters:  04/29/16 139 lb 9.6 oz (63.3 kg)  03/10/16 138 lb (62.6 kg)  10/29/15 136 lb (61.7 kg)    ENT normal. Plaque on teeth.   Neck supple. No adenopathy or thyromegaly. PERLA. Lungs are clear, good air entry, no wheezes, rhonchi or rales. S1 and S2 normal, no murmurs, regular rate and rhythm. Abdomen is soft without tenderness, guarding, mass or organomegaly. GU exam: no penile lesions or discharge, no testicular masses or tenderness, no hernias, guaiac  negative brown stool.  Extremities show no edema, normal peripheral pulses. Neurological is normal without focal findings.    Chemistry      Component Value Date/Time   NA 139 11/04/2015 1010   K 4.2 11/04/2015 1010   CL 108 11/04/2015 1010   CO2 25 11/04/2015 1010   BUN 11 11/04/2015 1010   CREATININE 0.70 11/04/2015 1010   CREATININE 0.68 (L) 09/08/2015 1041      Component Value Date/Time   CALCIUM 9.2 11/04/2015 1010   ALKPHOS 49 09/08/2015 1041   AST 22 09/08/2015 1041   ALT 19 09/08/2015 1041   BILITOT 0.5 09/08/2015 1041     Lipid Panel     Component Value Date/Time   CHOL 207 (H) 03/10/2016 1412   TRIG 126 03/10/2016 1412   HDL 59 03/10/2016 1412   CHOLHDL 3.5 03/10/2016 1412   VLDL 25 03/10/2016 1412   LDLCALC 123 (H) 03/10/2016 1412   UA: trace protein, small bilirubin otherwise negative  ASSESSMENT:  healthy adult male  PLAN:  follow a low fat, low cholesterol diet

## 2016-04-29 NOTE — Assessment & Plan Note (Signed)
chronic Mild pain Continue q 3 month lumbar back injections per pain management Continue tramadol

## 2016-05-02 ENCOUNTER — Encounter: Payer: Medicaid Other | Attending: Physical Medicine & Rehabilitation

## 2016-05-02 ENCOUNTER — Ambulatory Visit (HOSPITAL_BASED_OUTPATIENT_CLINIC_OR_DEPARTMENT_OTHER): Payer: Medicaid Other | Admitting: Physical Medicine & Rehabilitation

## 2016-05-02 ENCOUNTER — Encounter: Payer: Self-pay | Admitting: Physical Medicine & Rehabilitation

## 2016-05-02 VITALS — BP 136/81 | HR 75 | Resp 14

## 2016-05-02 DIAGNOSIS — M5417 Radiculopathy, lumbosacral region: Secondary | ICD-10-CM | POA: Insufficient documentation

## 2016-05-02 DIAGNOSIS — M5416 Radiculopathy, lumbar region: Secondary | ICD-10-CM

## 2016-05-02 NOTE — Patient Instructions (Signed)

## 2016-05-02 NOTE — Progress Notes (Signed)
  PROCEDURE RECORD [facility]   Name: Nathaniel Robinson DOB:01-Mar-1955 MRN: [id number]  Date:05/02/2016  Physician: Alysia Penna, MD    Nurse/CMA: Wessling CMA  Allergies: No Known Allergies  Consent Signed: Yes.    Is patient diabetic? No.  CBG today?   Pregnant: No. LMP: No LMP for male patient. (age 60-55)  Anticoagulants: no Anti-inflammatory: yes (Meloxicam 15 mg) Antibiotics: no  Procedure: Left Lumbar 4-5 transforaminal epidural steroid injection Position: Prone Start Time: 10:50 am   End Time: 10:57pm  Fluoro Time: 21  RN/CMA Obadiah Dennard RN Wessling CMA    Time 10:03 11:00am    BP 136/81 136/77    Pulse 75 77    Respirations 14 14    O2 Sat 98 98    S/S 6 6    Pain Level 6/10 4/10     D/C home with public transportation, patient A & O X 3, D/C instructions reviewed, and sits independently.

## 2016-05-02 NOTE — Progress Notes (Signed)
LEFT L4 and LEFT L5 Lumbar transforaminal epidural steroid injections under fluoroscopic guidance  Indication: Lumbosacral radiculitis is not relieved by medication management or other conservative care and interfering with self-care and mobility.   Informed consent was obtained after describing risk and benefits of the procedure with the patient, this includes bleeding, bruising, infection, paralysis and medication side effects.  The patient wishes to proceed and has given written consent.  Patient was placed in prone position.  The lumbar area was marked and prepped with Betadine.  It was entered with a 25-gauge 1-1/2 inch needle and one mL of 1% lidocaine was injected into the skin and subcutaneous tissue.  Then a 22-gauge 3.5 in spinal needle was inserted into the Left L4-5 intervertebral foramen under AP, lateral, and oblique view.  Then a solution containing one mL of 10 mg per mL dexamethasone and 2 mL of 1% lidocaine was injected. Then the same procedure was repeated at left L5-S1. Using same needle, injectate and technique. The patient tolerated procedure well.  Post procedure instructions were given.  Please see post procedure form. 

## 2016-05-27 MED FILL — MELOXICAM 15 MG TABLET: 15 | 30 days supply | Qty: 30 | Fill #2

## 2016-05-27 MED FILL — ATORVASTATIN 20 MG TABLET: 20 | 30 days supply | Qty: 30 | Fill #2

## 2016-05-27 MED FILL — traMADol HCL 50 MG TABS: 50 | 30 days supply | Qty: 60 | Fill #2

## 2016-05-27 MED FILL — BUPROPION HCL XL 150 MG TAB: 150 | 30 days supply | Qty: 30 | Fill #2

## 2016-06-27 MED FILL — traMADol HCL 50 MG TABS: 50 | 30 days supply | Qty: 60 | Fill #0

## 2016-06-27 MED FILL — MELOXICAM 15 MG TABLET: 15 | 90 days supply | Qty: 90 | Fill #1

## 2016-06-27 MED FILL — ATORVASTATIN 20 MG TABLET: 20 | 30 days supply | Qty: 30 | Fill #3

## 2016-06-27 MED FILL — BUPROPION HCL XL 150 MG TAB: 150 | 30 days supply | Qty: 30 | Fill #0

## 2016-06-29 ENCOUNTER — Encounter: Payer: Self-pay | Admitting: Family Medicine

## 2016-07-17 ENCOUNTER — Emergency Department (HOSPITAL_COMMUNITY)
Admission: EM | Admit: 2016-07-17 | Discharge: 2016-07-17 | Disposition: A | Discharge status: Home / Self Care | Payer: Medicaid Other | Attending: Emergency Medicine | Admitting: Emergency Medicine

## 2016-07-17 ENCOUNTER — Encounter (HOSPITAL_COMMUNITY): Payer: Self-pay | Admitting: Oncology

## 2016-07-17 ENCOUNTER — Emergency Department (HOSPITAL_COMMUNITY): Payer: Medicaid Other

## 2016-07-17 DIAGNOSIS — M79662 Pain in left lower leg: Secondary | ICD-10-CM | POA: Diagnosis not present

## 2016-07-17 DIAGNOSIS — Z87891 Personal history of nicotine dependence: Secondary | ICD-10-CM | POA: Diagnosis not present

## 2016-07-17 DIAGNOSIS — Z791 Long term (current) use of non-steroidal anti-inflammatories (NSAID): Secondary | ICD-10-CM | POA: Diagnosis not present

## 2016-07-17 DIAGNOSIS — M79605 Pain in left leg: Secondary | ICD-10-CM

## 2016-07-17 DIAGNOSIS — Z79899 Other long term (current) drug therapy: Secondary | ICD-10-CM | POA: Diagnosis not present

## 2016-07-17 DIAGNOSIS — M79661 Pain in right lower leg: Secondary | ICD-10-CM | POA: Diagnosis present

## 2016-07-17 DIAGNOSIS — R52 Pain, unspecified: Secondary | ICD-10-CM

## 2016-07-17 DIAGNOSIS — G8929 Other chronic pain: Secondary | ICD-10-CM | POA: Insufficient documentation

## 2016-07-17 DIAGNOSIS — M79604 Pain in right leg: Secondary | ICD-10-CM

## 2016-07-17 LAB — CBC WITH DIFFERENTIAL/PLATELET
Basophils Absolute: 0 10*3/uL (ref 0.0–0.1)
Basophils Relative: 0 %
EOS ABS: 0.1 10*3/uL (ref 0.0–0.7)
Eosinophils Relative: 2 %
HEMATOCRIT: 41.3 % (ref 39.0–52.0)
HEMOGLOBIN: 14.5 g/dL (ref 13.0–17.0)
LYMPHS ABS: 3.8 10*3/uL (ref 0.7–4.0)
Lymphocytes Relative: 54 %
MCH: 33 pg (ref 26.0–34.0)
MCHC: 35.1 g/dL (ref 30.0–36.0)
MCV: 94.1 fL (ref 78.0–100.0)
MONO ABS: 0.5 10*3/uL (ref 0.1–1.0)
MONOS PCT: 7 %
NEUTROS PCT: 37 %
Neutro Abs: 2.6 10*3/uL (ref 1.7–7.7)
Platelets: 228 10*3/uL (ref 150–400)
RBC: 4.39 MIL/uL (ref 4.22–5.81)
RDW: 13.4 % (ref 11.5–15.5)
WBC: 7.1 10*3/uL (ref 4.0–10.5)

## 2016-07-17 LAB — BASIC METABOLIC PANEL
Anion gap: 7 (ref 5–15)
BUN: 9 mg/dL (ref 6–20)
CALCIUM: 8.8 mg/dL — AB (ref 8.9–10.3)
CHLORIDE: 107 mmol/L (ref 101–111)
CO2: 25 mmol/L (ref 22–32)
CREATININE: 0.69 mg/dL (ref 0.61–1.24)
GFR calc Af Amer: >60 mL/min (ref >60)
GFR calc non Af Amer: >60 mL/min (ref >60)
Glucose, Bld: 100 mg/dL — ABNORMAL HIGH (ref 65–99)
Potassium: 4 mmol/L (ref 3.5–5.1)
SODIUM: 139 mmol/L (ref 135–145)

## 2016-07-17 LAB — SEDIMENTATION RATE: Sed Rate: 10 mm/hr (ref 0–16)

## 2016-07-17 MED ORDER — HYDROCODONE-ACETAMINOPHEN 5-325 MG PO TABS: 2.0000 | ORAL_TABLET | ORAL | 0 refills | Status: DC | PRN

## 2016-07-17 MED ORDER — HYDROCODONE-ACETAMINOPHEN 5-325 MG PO TABS
2.0000 | ORAL_TABLET | Freq: Once | ORAL | Status: AC
  Administered 2016-07-17: 2 via ORAL
  Filled 2016-07-17: qty 2

## 2016-07-17 NOTE — ED Notes (Signed)
Patient c/o leg pain x2 days. Pt also has back pain, but is on medication for it. Pt states the medication for his BP does not help his leg pain

## 2016-07-17 NOTE — ED Triage Notes (Signed)
Pt bib PTAR from home d/t lower back pain that radiates down both legs.  Pt was ambulatory for PTAR.  Pt does not speak [place].

## 2016-07-17 NOTE — ED Provider Notes (Signed)
[facility] DEPT Provider Note   CSN: [id number] Arrival date & time: 07/17/16  0413     History   Chief Complaint Chief Complaint  Patient presents with  . Back Pain    HPI Nathaniel Robinson is a 61 y.o. male past medical history of chronic pain and back pain who presents via EMS for 2 days of constant bilateral leg pain. Patient describes it as a sharp, shooting pain to the upper legs that radiates down the posterior aspects of his legs. He feels like the pain is in the upper muscles of his bilateral legs. He denies any preceding fall or trauma. He reports that pain is worsened when he tries to get up from a standing position, walk in, laid down. He reports that he has been taking meloxicam and tramadol for his back pain but states that these medications have not helped with his leg pain. He reports that his back pain has improved and he is not experiencing any back pain at this time. He was recently started on 20 mg of Lipitor in January 2018. Denies fevers, weight loss, numbness/weakness of upper and lower extremities, bowel/bladder incontinence, saddle anesthesia, history of back surgery, history of IVDA. He denies any shoulder pain, upper extremity pain  The history is provided by the patient. A language interpreter was used.    Past Medical History:  Diagnosis Date  . Chronic low back pain 2010     Patient Active Problem List   Diagnosis Date Noted  . Hyperlipidemia 03/14/2016  . Lumbar degenerative disc disease 09/11/2015  . Chronic pain syndrome 09/11/2015  . Pain in the chest 09/08/2015  . 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 Former heavy cigarette smoker (20-39 per day) 03/17/2014  . Chronic low back pain     Past Surgical History:  Procedure Laterality Date  . COLONOSCOPY N/A 05/15/2015   Procedure: COLONOSCOPY;  Surgeon: Danie Binder, MD;  Location: [facility];  Service: Endoscopy;  Laterality: N/A;  145 - moved to 1:30 -  office to notify  . None         Home Medications    Prior to Admission medications   Medication Sig Start Date End Date Taking? Authorizing Provider  atorvastatin (LIPITOR) 20 MG tablet Take 1 tablet (20 mg total) by mouth daily. 03/14/16  Yes Funches, Josalyn, MD  buPROPion (WELLBUTRIN XL) 150 MG 24 hr tablet Take 1 tablet (150 mg total) by mouth daily. 04/29/16  Yes Funches, Josalyn, MD  meloxicam (MOBIC) 15 MG tablet Take 1 tablet (15 mg total) by mouth daily. 03/10/16  Yes Funches, Josalyn, MD  traMADol (ULTRAM) 50 MG tablet Take 1 tablet (50 mg total) by mouth every 12 (twelve) hours as needed. 04/29/16  Yes Funches, Josalyn, MD  HYDROcodone-acetaminophen (NORCO/VICODIN) 5-325 MG tablet Take 2 tablets by mouth every 4 (four) hours as needed. 07/17/16   Volanda Napoleon, PA-C    Family History Family History  Problem Relation Age of Onset  . Cancer Mother        "cancer where they put baby", from description sounds uterine   . Heart disease Neg Hx   . Hypertension Neg Hx   . Diabetes Neg Hx   . Colon cancer Neg Hx     Social History Social History  Substance Use Topics  . Smoking status: Former Smoker    Packs/day: 0.75    Years: 25.00    Types: Cigarettes    Quit  date: 09/19/2015  . Smokeless tobacco: Never Used     Comment: 10 cigarettes a day   . Alcohol use 0.0 oz/week     Comment: social      Allergies   Patient has no known allergies.   Review of Systems Review of Systems  Constitutional: Negative for diaphoresis, fever and unexpected weight change.  Musculoskeletal: Positive for myalgias.     Physical Exam Updated Vital Signs BP 112/74   Pulse 65   Temp 98.6 F (37 C) (Oral)   Resp 16   SpO2 98%   Physical Exam  Constitutional: He appears well-developed and well-nourished.  Appears uncomfortable but in no acute distress.  HENT:  Head: Normocephalic and atraumatic.  Eyes: Conjunctivae and EOM are normal. Right eye exhibits no discharge. Left  eye exhibits no discharge. No scleral icterus.  Cardiovascular:  Pulses:      Radial pulses are 2+ on the right side, and 2+ on the left side.  Pulmonary/Chest: Effort normal.  Musculoskeletal:       Thoracic back: He exhibits no tenderness.       Lumbar back: He exhibits no tenderness.  Full range of motion of bilateral lower extremities. No tenderness palpation to bilateral knees or ankles. Tenderness to palpation diffusely across the upper femur and hips. No direct tenderness palpation overlying the hip joints bilaterally. No deformities or crepitus noted. No tenderness palpation of bilateral shoulders or upper extremity's.  Neurological: He is alert.  Follows commands, Moves all extremities  5/5 strength to BUE and BLE  Sensation intact throughout   Skin: Skin is warm and dry. Capillary refill takes less than 2 seconds.  Psychiatric: He has a normal mood and affect. His speech is normal and behavior is normal.  Nursing note and vitals reviewed.    ED Treatments / Results  Labs (all labs ordered are listed, but only abnormal results are displayed) Labs Reviewed  BASIC METABOLIC PANEL - Abnormal; Notable for the following:       Result Value   Glucose, Bld 100 (*)    Calcium 8.8 (*)    All other components within normal limits  SEDIMENTATION RATE  CBC WITH DIFFERENTIAL/PLATELET    EKG  EKG Interpretation None       Radiology Dg Hips Bilat With Pelvis 3-4 Views  Result Date: 07/17/2016 CLINICAL DATA:  Low back pain radiating to both legs. EXAM: DG HIP (WITH OR WITHOUT PELVIS) 3-4V BILAT COMPARISON:  None. FINDINGS: Bones the pelvis appear normal. Both hip joints appear normal. No joint space narrowing or other degenerative change. No sign of avascular necrosis. Sacroiliac joints are normal. Lowest vertebra is partially transitional. IMPRESSION: Negative radiographs.  No cause of pain identified. Electronically Signed   By: Nelson Chimes M.D.   On: 07/17/2016 06:55     Procedures Procedures (including critical care time)  Medications Ordered in ED Medications  HYDROcodone-acetaminophen (NORCO/VICODIN) 5-325 MG per tablet 2 tablet (2 tablets Oral Given 07/17/16 5784)     Initial Impression / Assessment and Plan / ED Course  I have reviewed the triage vital signs and the nursing notes.  Pertinent labs & imaging results that were available during my care of the patient were reviewed by me and considered in my medical decision making (see chart for details).     62 year old man with past history of back pain and chronic pain who presents with 2 days of constant bilateral leg pain. Consider muscle strain versus electrolyte imbalance. Also consider PMR, though  low suspicion. Will plan to check basic labs including CBC, BMP, ESR. Will also plan to check x-ray given question will history, analgesics given in the department.  Labs and imaging reviewed. X-ray of bilateral hips/pelvis negative for any acute fracture dislocation. CBC with no abnormalities. BMP shows elevated blood glucose otherwise normal labs. ESR within normal limits.  Re-evaluation with use of language interpreter: Patient reports improvement in pain after course of pain medications in the department. He has been able to ambulate in the department. Discussed results with patient. Explained to patient that he could be having some generalized muscular pain. Instructed to follow-up with his primary care doctor in the next 24-48 hours for further evaluation. Patient looked up on [place] substance abuse. He gets monthly doses of tramadol from his primary care doctor but otherwise no narcotics. Last narcotic was in September 2017. We'll plan to give a short course of pain medications to help with pain initially can follow-up with his primary care doctor. Strict return precautions discussed. Patient expresses understanding and agreement plan.  Final Clinical Impressions(s) / ED Diagnoses   Final diagnoses:   Pain  Pain in both lower extremities    New Prescriptions New Prescriptions   HYDROCODONE-ACETAMINOPHEN (NORCO/VICODIN) 5-325 MG TABLET    Take 2 tablets by mouth every 4 (four) hours as needed.     Volanda Napoleon, PA-C 07/17/16 [id number]    Daleen Bo, MD 07/21/16 [phone]

## 2016-07-17 NOTE — ED Notes (Signed)
Patient taken to xray.

## 2016-07-17 NOTE — Discharge Instructions (Signed)
Follow-up with her primary care doctor next 24-48 hours for further evaluation.  Take pain medication as directed for pain.  Return the emergency Department for any worsening pain, fever, difficulty walking, numbness/weakness in the arms or legs, difficulty breathing or any worsening or concerning symptoms.

## 2016-07-18 ENCOUNTER — Telehealth: Payer: Self-pay | Admitting: Family Medicine

## 2016-07-18 DIAGNOSIS — M545 Low back pain: Principal | ICD-10-CM

## 2016-07-18 DIAGNOSIS — G8929 Other chronic pain: Secondary | ICD-10-CM

## 2016-07-18 MED ORDER — TRAMADOL HCL 50 MG PO TABS: 50.0000 mg | ORAL_TABLET | Freq: Two times a day (BID) | ORAL | 0 refills | Status: DC | PRN

## 2016-07-18 NOTE — Telephone Encounter (Signed)
PT came to the office requesting a hospital F/U, Dr. Adrian Blackwater does not have any space aveliable, he was inform to call Tuesday morning to schedule with the walking clinic, he asking for a refill for HYDROcodone-acetaminophen (NORCO/VICODIN) 5-325 MG tablet  since he has a lot of pain, please follow up with PT

## 2016-07-18 NOTE — Telephone Encounter (Signed)
Cannot refill vicodin without visit He does take tramadol for chronic back pain Tramadol refilled He is also encouraged to f/u with pain management

## 2016-07-20 NOTE — Telephone Encounter (Signed)
Pt was called and informed of script being ready for pick up.

## 2016-07-21 ENCOUNTER — Ambulatory Visit: Payer: Medicaid Other | Attending: Family Medicine | Admitting: Physician Assistant

## 2016-07-21 VITALS — BP 104/63 | HR 73 | Temp 98.3°F | Resp 18 | Ht 67.0 in | Wt 138.0 lb

## 2016-07-21 DIAGNOSIS — M79604 Pain in right leg: Secondary | ICD-10-CM | POA: Diagnosis not present

## 2016-07-21 DIAGNOSIS — M79605 Pain in left leg: Secondary | ICD-10-CM | POA: Insufficient documentation

## 2016-07-21 DIAGNOSIS — M545 Low back pain: Secondary | ICD-10-CM | POA: Insufficient documentation

## 2016-07-21 DIAGNOSIS — G8929 Other chronic pain: Secondary | ICD-10-CM | POA: Diagnosis not present

## 2016-07-21 NOTE — Progress Notes (Signed)
Patient is here for bilateral leg pain.  Patient rates pain as an 8 right now. Patient has taken tramadol this morning.  Patient has eaten today.

## 2016-07-21 NOTE — Progress Notes (Signed)
Patient ID: Nathaniel Robinson, male   DOB: Mar 30, 1955, 61 y.o.   MRN: [id number]      Nathaniel Robinson, is a 61 y.o. male  GYB:[id number]  AJG:[id number]  DOB - August 07, 1955  Subjective:  Chief Complaint and HPI: Nathaniel Robinson is a 61 y.o. male here today for a follow up visit After being seen in the ED for B leg pain on 07/17/2016.  Labs and imaging reviewed. X-ray of bilateral hips/pelvis negative for any acute fracture dislocation. CBC with no abnormalities. BMP shows glucose=100 otherwise normal labs. ESR within normal limits.  He was prescribed a course of Norco and advised to f/up here.  His pain seems to be subsiding.  There were no known precipitating events or activities that caused the pain.  There has been no swelling, no SOB, no CP.  The pain was B in the thighs and hips.  No f/c.  Able to ambulate normally and without assistance.    He has a Optometrist "Kibrom" with him.    ED/Hospital notes reviewed.     ROS:   Constitutional:  No f/c, No night sweats, No unexplained weight loss. EENT:  No vision changes, No blurry vision, No hearing changes. No mouth, throat, or ear problems.  Respiratory: No cough, No SOB Cardiac: No CP, no palpitations GI:  No abd pain, No N/V/D. GU: No Urinary s/sx Musculoskeletal: chronic back pain- Neuro: No headache, no dizziness, no motor weakness.  Skin: No rash Endocrine:  No polydipsia. No polyuria.  Psych: Denies SI/HI  No problems updated.  ALLERGIES: No Known Allergies  PAST MEDICAL HISTORY: Past Medical History:  Diagnosis Date  . Chronic low back pain 2010     MEDICATIONS AT HOME: Prior to Admission medications   Medication Sig Start Date End Date Taking? Authorizing Provider  atorvastatin (LIPITOR) 20 MG tablet Take 1 tablet (20 mg total) by mouth daily. 03/14/16  Yes Funches, Josalyn, MD  buPROPion (WELLBUTRIN XL) 150 MG 24 hr tablet Take 1 tablet (150 mg total) by mouth daily. 04/29/16  Yes Funches, Josalyn, MD    meloxicam (MOBIC) 15 MG tablet Take 1 tablet (15 mg total) by mouth daily. 03/10/16  Yes Funches, Josalyn, MD  traMADol (ULTRAM) 50 MG tablet Take 1 tablet (50 mg total) by mouth every 12 (twelve) hours as needed. 07/18/16  Yes Funches, Adriana Mccallum, MD     Objective:  EXAM:   Vitals:   07/21/16 0843  BP: 104/63  Pulse: 73  Resp: 18  Temp: 98.3 F (36.8 C)  TempSrc: Oral  SpO2: 100%  Weight: 138 lb (62.6 kg)  Height: '5\' 7"'$  (1.702 m)    General appearance : A&OX3. NAD. Non-toxic-appearing HEENT: Atraumatic and Normocephalic.  PERRLA. EOM intact.   Neck: supple, no JVD. No cervical lymphadenopathy. No thyromegaly Chest/Lungs:  Breathing-non-labored, Good air entry bilaterally, breath sounds normal without rales, rhonchi, or wheezing  CVS: S1 S2 regular, no murmurs, gallops, rubs  Extremities: Bilateral Lower Ext shows no edema, both legs are warm to touch with = pulse throughout Neurology:  CN II-XII grossly intact, Non focal.   Psych:  TP linear. J/I WNL. Normal speech. Appropriate eye contact and affect.  Skin:  No Rash  Data Review Lab Results  Component Value Date   HGBA1C 5.50 03/17/2014     Assessment & Plan   1. Pain in both lower extremities His pain is much improved since the ED visit. ?if this came form a viral syndrome? He can continue to take the  tramadol/meloxicam as prescribed and as needed.  RTC if pain returns.  Patient have been counseled extensively about nutrition and exercise  Return in about 3 weeks (around 08/11/2016) for Dr Adrian Blackwater; recheck fasting cholesterol/chronic pain.  The patient was given clear instructions to go to ER or return to medical center if symptoms don't improve, worsen or new problems develop. The patient verbalized understanding. The patient was told to call to get lab results if they haven't heard anything in the next week.     Freeman Caldron, PA-C [facility] and [facility] [place], [place]    07/21/2016, 8:55 AM

## 2016-07-27 MED FILL — ATORVASTATIN 20 MG TABLET: 20 | 30 days supply | Qty: 30 | Fill #4

## 2016-07-27 MED FILL — traMADol HCL 50 MG TABS: 50 | 30 days supply | Qty: 60 | Fill #1

## 2016-07-27 MED FILL — BUPROPION HCL XL 150 MG TAB: 150 | 30 days supply | Qty: 30 | Fill #1

## 2016-08-02 ENCOUNTER — Encounter: Payer: Self-pay | Admitting: Physical Medicine & Rehabilitation

## 2016-08-02 ENCOUNTER — Encounter: Payer: Medicaid Other | Attending: Physical Medicine & Rehabilitation

## 2016-08-02 ENCOUNTER — Ambulatory Visit (HOSPITAL_BASED_OUTPATIENT_CLINIC_OR_DEPARTMENT_OTHER): Payer: Medicaid Other | Admitting: Physical Medicine & Rehabilitation

## 2016-08-02 VITALS — BP 106/70 | HR 85 | Resp 14

## 2016-08-02 DIAGNOSIS — M5417 Radiculopathy, lumbosacral region: Secondary | ICD-10-CM | POA: Insufficient documentation

## 2016-08-02 DIAGNOSIS — M5416 Radiculopathy, lumbar region: Secondary | ICD-10-CM

## 2016-08-02 NOTE — Patient Instructions (Signed)
Epidural Steroid Injection, Care After °Refer to this sheet in the next few weeks. These instructions provide you with information about caring for yourself after your procedure. Your health care provider may also give you more specific instructions. Your treatment has been planned according to current medical practices, but problems sometimes occur. Call your health care provider if you have any problems or questions after your procedure. °What can I expect after the procedure? °After your procedure, it is common to feel a little discomfort at the injection site. °Follow these instructions at home: °· For 24 hours after the procedure: °? Avoid using heat on the injection site. °? Do not take a tub bath, and do not soak in water. °? Do not drive if you received a medicine to help you relax (sedative). °· If directed, put ice on the injection site: °? Put ice in a plastic bag. °? Place a towel between your skin and the bag. °? Leave the ice on for 20 minutes, 2-3 times a day. °· Return to your normal activities as told by your health care provider. Ask your health care provider what activities are safe for you. °· You may remove the bandage (dressing) after 24 hours. °· Take over-the-counter and prescription medicines only as told by your health care provider. °· Keep all follow-up visits as told by your health care provider. This is important. °Contact a health care provider if: °· You have a fever. °· You continue to have pain and soreness around the injection site, even after taking over-the-counter pain medicine. °· You have severe, sudden, or lasting nausea or vomiting. °Get help right away if: °· You have severe pain at the injection site that is not relieved by medicines. °· You develop a severe headache or a stiff neck. °· You become sensitive to light. °· You have any new numbness or weakness in your legs or arms. °· You lose control of your bladder or bowel movements. °· You have trouble breathing. °This  information is not intended to replace advice given to you by your health care provider. Make sure you discuss any questions you have with your health care provider. °Document Released: 05/18/2010 Document Revised: 07/09/2015 Document Reviewed: 05/19/2015 °Elsevier Interactive Patient Education © 2018 Elsevier Inc[employer] ° °

## 2016-08-02 NOTE — Progress Notes (Signed)
LEFT L4 and LEFT L5 Lumbar transforaminal epidural steroid injections under fluoroscopic guidance  Indication: Lumbosacral radiculitis is not relieved by medication management or other conservative care and interfering with self-care and mobility.   Informed consent was obtained after describing risk and benefits of the procedure with the patient, this includes bleeding, bruising, infection, paralysis and medication side effects.  The patient wishes to proceed and has given written consent.  Patient was placed in prone position.  The lumbar area was marked and prepped with Betadine.  It was entered with a 25-gauge 1-1/2 inch needle and one mL of 1% lidocaine was injected into the skin and subcutaneous tissue.  Then a 22-gauge 3.5 in spinal needle was inserted into the Left L4-5 intervertebral foramen under AP, lateral, and oblique view.  Then a solution containing one mL of 10 mg per mL dexamethasone and 2 mL of 1% lidocaine was injected. Then the same procedure was repeated at left L5-S1. Using same needle, injectate and technique. The patient tolerated procedure well.  Post procedure instructions were given.  Please see post procedure form. 

## 2016-08-02 NOTE — Progress Notes (Signed)
  PROCEDURE RECORD [facility]   Name: Masahiro Iglesia DOB:1955/04/18 MRN: [id number]  Date:08/02/2016  Physician: Alysia Penna, MD    Nurse/CMA: Bright, CMA  Allergies: No Known Allergies  Consent Signed: Yes.    Is patient diabetic? No.  CBG today? N/A  Pregnant: No. LMP: No LMP for male patient. (age 60-55)  Anticoagulants: no Anti-inflammatory: no Antibiotics: no  Procedure: left L4 transforaminal epidural steroid injection  Position: Prone Start Time: 1024am   End Time: 1031am   Fluoro Time: 40s  RN/CMA Aubryanna Nesheim, CMA Bright, CMA    Time 10:00am 1035am    BP 106/72 114/73    Pulse 85 84    Respirations 14 14    O2 Sat 97 95    S/S 6 6    Pain Level 6/10 5/10     D/C home with Public Transportation, patient A & O X 3, D/C instructions reviewed, and sits independently.

## 2016-08-15 ENCOUNTER — Encounter: Payer: Self-pay | Admitting: Family Medicine

## 2016-08-15 ENCOUNTER — Ambulatory Visit: Payer: Medicaid Other | Attending: Family Medicine | Admitting: Family Medicine

## 2016-08-15 VITALS — BP 107/67 | HR 79 | Temp 98.3°F | Wt 136.2 lb

## 2016-08-15 DIAGNOSIS — G8929 Other chronic pain: Secondary | ICD-10-CM | POA: Insufficient documentation

## 2016-08-15 DIAGNOSIS — Z87891 Personal history of nicotine dependence: Secondary | ICD-10-CM | POA: Diagnosis not present

## 2016-08-15 DIAGNOSIS — E7800 Pure hypercholesterolemia, unspecified: Secondary | ICD-10-CM | POA: Diagnosis not present

## 2016-08-15 DIAGNOSIS — M545 Low back pain: Secondary | ICD-10-CM | POA: Insufficient documentation

## 2016-08-15 MED ORDER — TRAMADOL HCL 50 MG PO TABS: 50.0000 mg | ORAL_TABLET | Freq: Two times a day (BID) | ORAL | 2 refills | Status: DC | PRN

## 2016-08-15 NOTE — Progress Notes (Signed)
Subjective:  Patient ID: Nathaniel Robinson, male    DOB: 1955-09-29  Age: 61 y.o. MRN: [id number]  CC: Follow-up   HPI Nathaniel Robinson presents for    1. Hyperlipidemia: he is taking Lipitor 20 mg daily. He had upper thigh and calf pain last month this pain has since resolved. He denies pain in shoulders. He denies associated rash.   2. Chronic back pain: his current pain level is 4-5/10. He received a lumbar injection one week ago. He is also taking tramadol. He worked part time 3 months ago. 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  He developed R low back pain. He stopped working and his pain improved.   Social History  Substance Use Topics  . Smoking status: Former Smoker    Packs/day: 0.75    Years: 25.00    Types: Cigarettes    Quit date: 09/19/2015  . Smokeless tobacco: Never Used     Comment: 10 cigarettes a day   . Alcohol use 0.0 oz/week     Comment: social    Outpatient Medications Prior to Visit  Medication Sig Dispense Refill  . atorvastatin (LIPITOR) 20 MG tablet Take 1 tablet (20 mg total) by mouth daily. 30 tablet 11  . buPROPion (WELLBUTRIN XL) 150 MG 24 hr tablet Take 1 tablet (150 mg total) by mouth daily. 30 tablet 2  . meloxicam (MOBIC) 15 MG tablet Take 1 tablet (15 mg total) by mouth daily. 30 tablet 5  . traMADol (ULTRAM) 50 MG tablet Take 1 tablet (50 mg total) by mouth every 12 (twelve) hours as needed. 60 tablet 0   No facility-administered medications prior to visit.     ROS Review of Systems  Constitutional: Negative for chills, fatigue, fever and unexpected weight change.  Eyes: Negative for visual disturbance.  Respiratory: Negative for cough and shortness of breath.   Cardiovascular: Negative for chest pain, palpitations and leg swelling.  Gastrointestinal: Negative for abdominal pain, blood in stool, constipation, diarrhea, nausea and vomiting.  Endocrine: Negative for polydipsia, polyphagia and polyuria.  Musculoskeletal: Positive for  back pain. Negative for arthralgias, gait problem, myalgias and neck pain.  Skin: Negative for rash.  Allergic/Immunologic: Negative for immunocompromised state.  Hematological: Negative for adenopathy. Does not bruise/bleed easily.  Psychiatric/Behavioral: Negative for dysphoric mood, sleep disturbance and suicidal ideas. The patient is not nervous/anxious.     Objective:  BP 107/67   Pulse 79   Temp 98.3 F (36.8 C) (Oral)   Wt 136 lb 3.2 oz (61.8 kg)   SpO2 100%   BMI 21.33 kg/m   BP/Weight 08/15/2016 06/10/621 08/19/2829  Systolic BP 517 616 073  Diastolic BP 67 70 63  Wt. (Lbs) 136.2 - 138  BMI 21.33 - 21.61    Physical Exam  Constitutional: He appears well-developed and well-nourished. No distress.  HENT:  Head: Normocephalic and atraumatic.  Neck: Normal range of motion. Neck supple.  Cardiovascular: Normal rate, regular rhythm, normal heart sounds and intact distal pulses.   Pulmonary/Chest: Effort normal and breath sounds normal.  Musculoskeletal: He exhibits no edema.       Right hip: He exhibits no tenderness.       Left hip: He exhibits no tenderness.       Right lower leg: He exhibits no tenderness.       Left lower leg: He exhibits no tenderness.  Neurological: He is alert.  Skin: Skin is warm and dry. No rash noted. No erythema.  Psychiatric: He has a normal mood and affect.   Assessment & Plan:  Nathaniel Robinson was seen today for follow-up.  Diagnoses and all orders for this visit:  Pure hypercholesterolemia -     Lipid Panel -     CK  Chronic left-sided low back pain without sciatica -     traMADol (ULTRAM) 50 MG tablet; Take 1 tablet (50 mg total) by mouth every 12 (twelve) hours as needed.   There are no diagnoses linked to this encounter.  No orders of the defined types were placed in this encounter.   Follow-up: Return in about 3 months (around 11/15/2016) for chronic low back pain .   Boykin Nearing MD

## 2016-08-15 NOTE — Patient Instructions (Addendum)
Deadrick was seen today for follow-up.  Diagnoses and all orders for this visit:  Pure hypercholesterolemia -     Lipid Panel -     CK  Chronic left-sided low back pain without sciatica -     traMADol (ULTRAM) 50 MG tablet; Take 1 tablet (50 mg total) by mouth every 12 (twelve) hours as needed.   I will start work on [facility] on October 03, 2016  You are welcome to follow me  Romelle Starcher at [address] [place], [place] [address]  Ph: [phone], call for new patient appointment    F/u in 3 months for chronic low back pain   Dr. Adrian Blackwater

## 2016-08-15 NOTE — Assessment & Plan Note (Signed)
On lipitor 20 mg daily Rechecking lipids today Recent myalgias have resolved, but also checking CK

## 2016-08-15 NOTE — Assessment & Plan Note (Signed)
Chronic low back pain Controlled with tramadol and every 3 months lumbar injections Continue current regimen

## 2016-08-16 LAB — LIPID PANEL
Chol/HDL Ratio: 2.7 ratio (ref 0.0–5.0)
Cholesterol, Total: 146 mg/dL (ref 100–199)
HDL: 55 mg/dL (ref >39)
LDL Calculated: 60 mg/dL (ref 0–99)
TRIGLYCERIDES: 153 mg/dL — AB (ref 0–149)
VLDL Cholesterol Cal: 31 mg/dL (ref 5–40)

## 2016-08-16 LAB — CK: CK TOTAL: 55 U/L (ref 24–204)

## 2016-08-30 MED FILL — traMADol HCL 50 MG TABS: 50 | 30 days supply | Qty: 60 | Fill #0

## 2016-08-30 MED FILL — ATORVASTATIN 20 MG TABLET: 20 | 30 days supply | Qty: 30 | Fill #5

## 2016-09-29 MED FILL — ATORVASTATIN 20 MG TABLET: 20 | 30 days supply | Qty: 30 | Fill #6

## 2016-09-29 MED FILL — traMADol HCL 50 MG TABS: 50 | 30 days supply | Qty: 60 | Fill #1

## 2016-10-03 ENCOUNTER — Ambulatory Visit: Payer: Medicaid Other | Admitting: Physical Medicine & Rehabilitation

## 2016-10-31 ENCOUNTER — Ambulatory Visit (HOSPITAL_BASED_OUTPATIENT_CLINIC_OR_DEPARTMENT_OTHER): Payer: Medicaid Other | Admitting: Physical Medicine & Rehabilitation

## 2016-10-31 ENCOUNTER — Encounter: Payer: Self-pay | Admitting: Physical Medicine & Rehabilitation

## 2016-10-31 ENCOUNTER — Encounter: Payer: Medicaid Other | Attending: Physical Medicine & Rehabilitation

## 2016-10-31 VITALS — BP 116/77 | HR 79

## 2016-10-31 DIAGNOSIS — M5417 Radiculopathy, lumbosacral region: Secondary | ICD-10-CM | POA: Diagnosis not present

## 2016-10-31 DIAGNOSIS — M5416 Radiculopathy, lumbar region: Secondary | ICD-10-CM

## 2016-10-31 MED FILL — traMADol HCL 50 MG TABS: 50 | 30 days supply | Qty: 60 | Fill #2

## 2016-10-31 MED FILL — ATORVASTATIN 20 MG TABLET: 20 | 30 days supply | Qty: 30 | Fill #7

## 2016-10-31 NOTE — Progress Notes (Signed)
LEFT L4 and LEFT L5 Lumbar transforaminal epidural steroid injections under fluoroscopic guidance  Indication: Lumbosacral radiculitis is not relieved by medication management or other conservative care and interfering with self-care and mobility.   Informed consent was obtained after describing risk and benefits of the procedure with the patient, this includes bleeding, bruising, infection, paralysis and medication side effects.  The patient wishes to proceed and has given written consent.  Patient was placed in prone position.  The lumbar area was marked and prepped with Betadine.  It was entered with a 25-gauge 1-1/2 inch needle and one mL of 1% lidocaine was injected into the skin and subcutaneous tissue.  Then a 22-gauge 3.5 in spinal needle was inserted into the Left L4-5 intervertebral foramen under AP, lateral, and oblique view.  Then a solution containing one mL of 10 mg per mL dexamethasone and 2 mL of 1% lidocaine was injected. Then the same procedure was repeated at left L5-S1. Using same needle, injectate and technique. The patient tolerated procedure well.  Post procedure instructions were given.  Please see post procedure form.

## 2016-10-31 NOTE — Patient Instructions (Signed)

## 2016-10-31 NOTE — Progress Notes (Signed)
  PROCEDURE RECORD [facility]   Name: Nathaniel Robinson DOB:Nov 26, 1955 MRN: [id number]  Date:10/31/2016  Physician: Alysia Penna, MD    Nurse/CMA: Bright CMA  Allergies: No Known Allergies  Consent Signed: Yes.    Is patient diabetic? No.  CBG today? na  Pregnant: No. LMP: No LMP for male patient. (age 60-55)  Anticoagulants: no Anti-inflammatory: yes (meloxicam) Antibiotics: no  Procedure:left lumbar level 4-5 transforaminal epidural steroid injection  Position: Prone   Start Time:1133am  End Time: 1143am  Fluoro Time: 45s  RN/CMA Shumaker RN Bright CMA    Time 11:03 1146am    BP 116/77 123/78    Pulse 79 74    Respirations 14 16    O2 Sat 98 97    S/S 6 6    Pain Level 7/10 6/10     D/C home with public transportation, patient A & O X 3, D/C instructions reviewed, and sits independently.

## 2016-11-14 ENCOUNTER — Ambulatory Visit (HOSPITAL_BASED_OUTPATIENT_CLINIC_OR_DEPARTMENT_OTHER): Payer: Medicaid Other | Admitting: Physical Medicine & Rehabilitation

## 2016-11-14 ENCOUNTER — Encounter: Payer: Medicaid Other | Attending: Physical Medicine & Rehabilitation

## 2016-11-14 ENCOUNTER — Encounter: Payer: Self-pay | Admitting: Physical Medicine & Rehabilitation

## 2016-11-14 VITALS — BP 111/72 | HR 85

## 2016-11-14 DIAGNOSIS — M545 Low back pain, unspecified: Secondary | ICD-10-CM

## 2016-11-14 DIAGNOSIS — M5417 Radiculopathy, lumbosacral region: Secondary | ICD-10-CM | POA: Diagnosis present

## 2016-11-14 NOTE — Progress Notes (Signed)
Subjective:    Patient ID: Nathaniel Robinson, male    DOB: [birth year], 61 y.o.   MRN: [id number] Patient has an Arabic language interpreter HPI 61 year old male with history of chronic low back pain. He has had primarily left-sided pain, but over the last 5 months, has developed some right-sided pain. He has taken on a part-time job and since this time he has done more lifting at work. He denies any pain going down his legs. No loss of bowel or bladder function. No numbness or tingling in the lower extremities.  Pain Inventory Average Pain 6 Pain Right Now 5 My pain is .  In the last 24 hours, has pain interfered with the following? General activity 5 Relation with others 5 Enjoyment of life 5 What TIME of day is your pain at its worst? daytime Sleep (in general) Fair  Pain is worse with: walking, bending, standing and some activites Pain improves with: . Relief from Meds: 5  Mobility walk without assistance ability to climb steps?  no do you drive?  no  Function disabled: date disabled 2016  Neuro/Psych No problems in this area  Prior Studies Any changes since last visit?  no  Physicians involved in your care Any changes since last visit?  no   Family History  Problem Relation Age of Onset  . Cancer Mother        "cancer where they put baby", from description sounds uterine   . Heart disease Neg Hx   . Hypertension Neg Hx   . Diabetes Neg Hx   . Colon cancer Neg Hx    Social History   Social History  . Marital status: Married    Spouse name: N/A  . Number of children: 4  . Years of education: N/A   Occupational History  . Previous Accountant      [place]    Social History Main Topics  . Smoking status: Former Smoker    Packs/day: 0.75    Years: 25.00    Types: Cigarettes    Quit date: 09/19/2015  . Smokeless tobacco: Never Used     Comment: 10 cigarettes a day   . Alcohol use 0.0 oz/week     Comment: social   . Drug use: No  . Sexual activity: No    Other Topics Concern  . None   Social History Narrative   Form [place]   Moved to [place] in 01/2014    Lives in [place] alone   Wife and 4 children in [place].    Past Surgical History:  Procedure Laterality Date  . COLONOSCOPY N/A 05/15/2015   Procedure: COLONOSCOPY;  Surgeon: Danie Binder, MD;  Location: [facility];  Service: Endoscopy;  Laterality: N/A;  145 - moved to 1:30 - office to notify  . None     Past Medical History:  Diagnosis Date  . Chronic low back pain 2010    There were no vitals taken for this visit.  Opioid Risk Score:   Fall Risk Score:  `1  Depression screen PHQ 2/9  Depression screen [facility] 2/9 10/31/2016 08/15/2016 08/15/2016 07/21/2016 05/02/2016 03/10/2016 09/08/2015  Decreased Interest 0 0 0 0 0 0 0  Down, Depressed, Hopeless 0 0 0 0 0 0 0  PHQ - 2 Score 0 0 0 0 0 0 0  Altered sleeping - 0 0 - - 0 -  Tired, decreased energy - 0 0 - - 0 -  Change in appetite - 0 0 - -  0 -  Feeling bad or failure about yourself  - 0 0 - - 0 -  Trouble concentrating - 0 0 - - 0 -  Moving slowly or fidgety/restless - 0 0 - - 0 -  Suicidal thoughts - 0 0 - - 0 -  PHQ-9 Score - 0 0 - - 0 -     Review of Systems  Constitutional: Negative.   HENT: Negative.   Eyes: Negative.   Respiratory: Negative.   Cardiovascular: Negative.   Gastrointestinal: Negative.   Endocrine: Negative.   Genitourinary: Negative.   Musculoskeletal: Negative.   Skin: Negative.   Allergic/Immunologic: Negative.   Neurological: Negative.   Hematological: Negative.   Psychiatric/Behavioral: Negative.   All other systems reviewed and are negative.      Objective:   Physical Exam  Constitutional: He is oriented to person, place, and time. He appears well-developed and well-nourished.  HENT:  Head: Normocephalic and atraumatic.  Eyes: Pupils are equal, round, and reactive to light. Conjunctivae and EOM are normal.  Neck: Normal range of motion.  Musculoskeletal:       Right hip: He exhibits  decreased range of motion.       Left hip: He exhibits decreased range of motion.  Neurological: He is alert and oriented to person, place, and time. He displays no atrophy. No sensory deficit. He exhibits normal muscle tone. Coordination and gait normal.  Reflex Scores:      Patellar reflexes are 2+ on the right side and 2+ on the left side.      Achilles reflexes are 2+ on the right side and 2+ on the left side. Motor strength is 5/5 bilateral , hip flexors, knee extensors, ankle dorsiflexors  Sensation intact to pinprick, bilateral L2, L3, L4, L5 dermatomal distribution  Negative straight leg raising test  Skin: Skin is warm and dry.  Psychiatric: He has a normal mood and affect.  Nursing note and vitals reviewed.         Assessment & Plan:  1. Right-sided low back pain without sciatica. This is of subacute onset. He is not experiencing any lower extremity symptoms or any cauda equina type symptoms. Reviewed MRI with the patient through the interpreter, discussed his degenerative changes and likely aggravation of pain through increased lifting activities. We discussed physical therapy, which has limited funding through his insurance, have prescribed him some back exercises, which will be translated by his interpreter. Set him up with right-sided L3, L4, L5 medial branch blocks, consider sacroiliac injections and failing this, consider transforaminal although no significant compressive changes  Continue tramadol on a when necessary basis

## 2016-11-14 NOTE — Patient Instructions (Signed)

## 2016-11-17 ENCOUNTER — Encounter: Payer: Self-pay | Admitting: Physician Assistant

## 2016-11-17 ENCOUNTER — Ambulatory Visit: Payer: Medicaid Other | Attending: Internal Medicine | Admitting: Physician Assistant

## 2016-11-17 VITALS — BP 118/76 | HR 76 | Temp 98.0°F | Resp 18 | Ht 67.0 in | Wt 139.6 lb

## 2016-11-17 DIAGNOSIS — Z23 Encounter for immunization: Secondary | ICD-10-CM | POA: Diagnosis not present

## 2016-11-17 DIAGNOSIS — G8929 Other chronic pain: Secondary | ICD-10-CM | POA: Diagnosis not present

## 2016-11-17 DIAGNOSIS — Z79899 Other long term (current) drug therapy: Secondary | ICD-10-CM | POA: Insufficient documentation

## 2016-11-17 DIAGNOSIS — M545 Low back pain: Secondary | ICD-10-CM | POA: Diagnosis not present

## 2016-11-17 MED ORDER — CELECOXIB 200 MG PO CAPS: 200.0000 mg | ORAL_CAPSULE | Freq: Every day | ORAL | 3 refills | Status: DC

## 2016-11-17 MED ORDER — TRAMADOL HCL 50 MG PO TABS: 50.0000 mg | ORAL_TABLET | Freq: Two times a day (BID) | ORAL | 2 refills | Status: DC | PRN

## 2016-11-17 MED FILL — CELECOXIB 200 MG CAPSULE: 200 | 30 days supply | Qty: 30 | Fill #0

## 2016-11-17 NOTE — Progress Notes (Signed)
Patient ID: Nathaniel Robinson, male   DOB: Oct 24, 1955, 61 y.o.   MRN: [id number]     Nathaniel Robinson, is a 61 y.o. male  QBH:[id number]  OXB:[id number]  DOB - 03-14-1955  Subjective:  Chief Complaint and HPI: Nathaniel Robinson is a 61 y.o. male here today for med RF for LBP and for flu shot.  He is currently under the care of Dr Letta Pate for epidural injection and LBP management.  He takes tramadol bid for pain and is out of the prescription.  He is not currently on an NSAID.  Says mobic didn't help.  Needs flu shot.  ROS:   Constitutional:  No f/c, No night sweats, No unexplained weight loss. EENT:  No vision changes, No blurry vision, No hearing changes. No mouth, throat, or ear problems.  Respiratory: No cough, No SOB Cardiac: No CP, no palpitations GI:  No abd pain, No N/V/D. GU: No Urinary s/sx Musculoskeletal:+ chronic LBP Neuro: No headache, no dizziness, no motor weakness.  Skin: No rash Endocrine:  No polydipsia. No polyuria.  Psych: Denies SI/HI  No problems updated.  ALLERGIES: No Known Allergies  PAST MEDICAL HISTORY: Past Medical History:  Diagnosis Date  . Chronic low back pain 2010     MEDICATIONS AT HOME: Prior to Admission medications   Medication Sig Start Date End Date Taking? Authorizing Provider  atorvastatin (LIPITOR) 20 MG tablet Take 1 tablet (20 mg total) by mouth daily. 03/14/16  Yes Funches, Josalyn, MD  traMADol (ULTRAM) 50 MG tablet Take 1 tablet (50 mg total) by mouth every 12 (twelve) hours as needed. 11/17/16  Yes Latarra Eagleton M, PA-C  buPROPion (WELLBUTRIN XL) 150 MG 24 hr tablet Take 1 tablet (150 mg total) by mouth daily. 04/29/16   Funches, Adriana Mccallum, MD  celecoxib (CELEBREX) 200 MG capsule Take 1 capsule (200 mg total) by mouth daily. 11/17/16   Argentina Donovan, PA-C     Objective:  EXAM:   Vitals:   11/17/16 0956  BP: 118/76  Pulse: 76  Resp: 18  Temp: 98 F (36.7 C)  TempSrc: Oral  SpO2: 98%  Weight: 139 lb 9.6 oz (63.3 kg)  Height:  5\' 7"  (1.702 m)    General appearance : A&OX3. NAD. Non-toxic-appearing HEENT: Atraumatic and Normocephalic.  PERRLA. EOM intact.   Neck: supple, no JVD. No cervical lymphadenopathy. No thyromegaly Chest/Lungs:  Breathing-non-labored, Good air entry bilaterally, breath sounds normal without rales, rhonchi, or wheezing  CVS: S1 S2 regular, no murmurs, gallops, rubs  Extremities: Bilateral Lower Ext shows no edema, both legs are warm to touch with = pulse throughout Neurology:  CN II-XII grossly intact, Non focal.   Psych:  TP linear. J/I WNL. Normal speech. Appropriate eye contact and affect.  Skin:  No Rash  Data Review Lab Results  Component Value Date   HGBA1C 5.50 03/17/2014     Assessment & Plan   1. Chronic left-sided low back pain without sciatica Continue f/up with Dr Dianna Limbo. Will try celebrex to help cut back on tramadol use- celecoxib (CELEBREX) 200 MG capsule; Take 1 capsule (200 mg total) by mouth daily.  Dispense: 30 capsule; Refill: 3 - traMADol (ULTRAM) 50 MG tablet; Take 1 tablet (50 mg total) by mouth every 12 (twelve) hours as needed.  Dispense: 60 tablet; Refill: 2  2. Needs flu shot - Flu Vaccine QUAD 6+ mos PF IM (Fluarix Quad PF)     Patient have been counseled extensively about nutrition and exercise  Return in about 3  months (around 02/17/2017) for assign new pcp.  The patient was given clear instructions to go to ER or return to medical center if symptoms don't improve, worsen or new problems develop. The patient verbalized understanding. The patient was told to call to get lab results if they haven't heard anything in the next week.     Freeman Caldron, PA-C [facility] and [place], [place]   11/17/2016, 10:11 AM

## 2016-11-30 MED FILL — ATORVASTATIN 20 MG TABLET: 20 | 30 days supply | Qty: 30 | Fill #8

## 2016-12-01 ENCOUNTER — Encounter: Payer: Self-pay | Admitting: Pharmacist

## 2016-12-01 NOTE — Progress Notes (Signed)
Prior authorization completed and approved for Tramadol. Approval #[id number]. Approved for 180 days through [facility]. Further use will require justification and progress notes be submitted to [facility].

## 2016-12-02 MED FILL — traMADol HCL 50 MG TABS: 50 | 30 days supply | Qty: 60 | Fill #0

## 2016-12-05 ENCOUNTER — Ambulatory Visit (HOSPITAL_BASED_OUTPATIENT_CLINIC_OR_DEPARTMENT_OTHER): Payer: Medicaid Other | Admitting: Physical Medicine & Rehabilitation

## 2016-12-05 ENCOUNTER — Encounter: Payer: Self-pay | Admitting: Physical Medicine & Rehabilitation

## 2016-12-05 DIAGNOSIS — M47816 Spondylosis without myelopathy or radiculopathy, lumbar region: Secondary | ICD-10-CM

## 2016-12-05 DIAGNOSIS — M5417 Radiculopathy, lumbosacral region: Secondary | ICD-10-CM | POA: Diagnosis not present

## 2016-12-05 NOTE — Progress Notes (Signed)
  Right lumbar L3, L4 medial branch blocks and L5 dorsal ramus injection under fluoroscopic guidance  Indication: Right Lumbar pain which is not relieved by medication management or other conservative care and interfering with self-care and mobility.  Informed consent was obtained after describing risks and benefits of the procedure with the patient, this includes bleeding, bruising, infection, paralysis and medication side effects. The patient wishes to proceed and has given written consent. The patient was placed in a prone position. The lumbar area was marked and prepped with Betadine. One ML of 1% lidocaine was injected into each of 3 areas into the skin and subcutaneous tissue. Then a 22-gauge 3.5inch spinal needle was inserted targeting the junction of the Right S1 superior articular process and sacral ala junction. Needle was advanced under fluoroscopic guidance. Bone contact was made.Isovue 200 was injected x0.5 mL demonstrating no intravascular uptake. Then a solution containing 2% MPF lidocaine was injected x0.5 mL. Then the Right L5 superior articular process in transverse process junction was targeted. Bone contact was made.Isovue 200 was injected x0.5 mL demonstrating no intravascular uptake. Then a solution containing 2% MPF lidocaine was injected x0.5 mL. Then the Right L4 superior articular process in transverse process junction was targeted. Bone contact was made. Isovue 200 was injected x0.5 mL demonstrating no intravascular uptake. Then a solution containing2% MPF lidocaine was injected x0.5 mL Patient tolerated procedure well. Post procedure instructions were given. Please refer to post procedure form.

## 2016-12-05 NOTE — Patient Instructions (Signed)

## 2016-12-05 NOTE — Progress Notes (Signed)
cpr PROCEDURE RECORD [facility]   Name: Nathaniel Robinson DOB:May 01, 1955 MRN: [id number]  Date:12/05/2016  Physician: Alysia Penna, MD    Nurse/CMA: Robinson, CMA  Allergies: No Known Allergies  Consent Signed: Yes.    Is patient diabetic? No.  CBG today?   Pregnant: No. LMP: No LMP for male patient. (age 60-55)  Anticoagulants: no Anti-inflammatory: no Antibiotics: no  Procedure: right medial branch block Lumbar 3-4-5 Position: Prone Start Time: 10:08am         End Time: 10:14am Fluoro Time: 22  RN/CMA Nathaniel Ingber RN Robinson CMA    Time 9:30 am 10:18 am    BP 132/81 125/70    Pulse 84 79    Respirations 14 14    O2 Sat 98 97    S/S 6 6    Pain Level 5/10 4/10     D/C home on bus, patient A & O X 3, D/C instructions reviewed, and sits independently.

## 2016-12-16 MED FILL — CELECOXIB 200 MG CAPSULE: 200 | 30 days supply | Qty: 30 | Fill #1

## 2016-12-30 MED FILL — traMADol HCL 50 MG TABS: 50 | 30 days supply | Qty: 60 | Fill #1

## 2016-12-30 MED FILL — ATORVASTATIN 20 MG TABLET: 20 | 30 days supply | Qty: 30 | Fill #9

## 2017-01-03 ENCOUNTER — Ambulatory Visit: Payer: Medicaid Other | Admitting: Physical Medicine & Rehabilitation

## 2017-01-03 ENCOUNTER — Encounter: Payer: Medicaid Other | Attending: Physical Medicine & Rehabilitation

## 2017-01-03 VITALS — BP 126/79 | HR 87

## 2017-01-03 DIAGNOSIS — M545 Low back pain, unspecified: Secondary | ICD-10-CM

## 2017-01-03 DIAGNOSIS — M47816 Spondylosis without myelopathy or radiculopathy, lumbar region: Secondary | ICD-10-CM | POA: Diagnosis not present

## 2017-01-03 DIAGNOSIS — M5417 Radiculopathy, lumbosacral region: Secondary | ICD-10-CM | POA: Insufficient documentation

## 2017-01-03 NOTE — Progress Notes (Signed)
Subjective:    Patient ID: Stephon Weathers, male    DOB: [birth year], 61 y.o.   MRN: [id number]  HPI Patient returns today initially for medial branch blocks right L3-L4 and right L5 dorsal ramus injection under fluoroscopic guidance.  His last procedure was performed 12/05/2016 and he still has pain relief from this injection.  Left-sided low back pain was previous relief with L4 and L5 transforaminal epidural steroid injections but not with L3-L4-L5 medial branch blocks. We discussed his complaints with the interpreter.  At this point his pain is mild to moderate and only intermittent. Pain Inventory Average Pain 6 Pain Right Now 0 My pain is burning  In the last 24 hours, has pain interfered with the following? General activity 5 Relation with others 5 Enjoyment of life 5 What TIME of day is your pain at its worst? daytime Sleep (in general) Fair  Pain is worse with: walking, bending, inactivity and some activites Pain improves with: rest, medication and injections Relief from Meds: 6  Mobility walk without assistance  Function disabled: date disabled 2016  Neuro/Psych No problems in this area  Prior Studies Any changes since last visit?  no  Physicians involved in your care Any changes since last visit?  no   Family History  Problem Relation Age of Onset  . Cancer Mother        "cancer where they put baby", from description sounds uterine   . Heart disease Neg Hx   . Hypertension Neg Hx   . Diabetes Neg Hx   . Colon cancer Neg Hx    Social History   Socioeconomic History  . Marital status: Married    Spouse name: Not on file  . Number of children: 4  . Years of education: Not on file  . Highest education level: Not on file  Social Needs  . Financial resource strain: Not on file  . Food insecurity - worry: Not on file  . Food insecurity - inability: Not on file  . Transportation needs - medical: Not on file  . Transportation needs - non-medical: Not on  file  Occupational History  . Occupation: Previous Optometrist     Comment: [place]   Tobacco Use  . Smoking status: Former Smoker    Packs/day: 0.75    Years: 25.00    Pack years: 18.75    Types: Cigarettes    Last attempt to quit: 09/19/2015    Years since quitting: 1.2  . Smokeless tobacco: Never Used  . Tobacco comment: 10 cigarettes a day   Substance and Sexual Activity  . Alcohol use: Yes    Alcohol/week: 0.0 oz    Comment: social   . Drug use: No  . Sexual activity: No  Other Topics Concern  . Not on file  Social History Narrative   Form [place]   Moved to [place] in 01/2014    Lives in [place] alone   Wife and 4 children in [place].    Past Surgical History:  Procedure Laterality Date  . COLONOSCOPY N/A 05/15/2015   Performed by Danie Binder, MD at [facility]  . None     Past Medical History:  Diagnosis Date  . Chronic low back pain 2010    There were no vitals taken for this visit.  Opioid Risk Score:   Fall Risk Score:  `1  Depression screen PHQ 2/9  Depression screen [facility] 2/9 12/05/2016 10/31/2016 08/15/2016 08/15/2016 07/21/2016 05/02/2016 03/10/2016  Decreased Interest 0 0  0 0 0 0 0  Down, Depressed, Hopeless 0 0 0 0 0 0 0  PHQ - 2 Score 0 0 0 0 0 0 0  Altered sleeping - - 0 0 - - 0  Tired, decreased energy - - 0 0 - - 0  Change in appetite - - 0 0 - - 0  Feeling bad or failure about yourself  - - 0 0 - - 0  Trouble concentrating - - 0 0 - - 0  Moving slowly or fidgety/restless - - 0 0 - - 0  Suicidal thoughts - - 0 0 - - 0  PHQ-9 Score - - 0 0 - - 0     Review of Systems  Constitutional: Negative.   HENT: Negative.   Eyes: Negative.   Respiratory: Negative.   Cardiovascular: Negative.   Gastrointestinal: Negative.   Endocrine: Negative.   Genitourinary: Negative.   Musculoskeletal: Negative.   Skin: Negative.   Allergic/Immunologic: Negative.   Neurological: Negative.   Hematological: Negative.   Psychiatric/Behavioral: Negative.   All other  systems reviewed and are negative.      Objective:   Physical Exam  Constitutional: He is oriented to person, place, and time. He appears well-developed and well-nourished. No distress.  HENT:  Head: Normocephalic and atraumatic.  Eyes: Conjunctivae and EOM are normal. Pupils are equal, round, and reactive to light.  Musculoskeletal: Normal range of motion.  Neurological: He is alert and oriented to person, place, and time.  Skin: Skin is warm. He is not diaphoretic.  Nursing note and vitals reviewed.  Lumbar spine mild tenderness palpation bilateral L3-L4-L5 paraspinal muscles. No tenderness over the sacroiliac area no tenderness over the hip. Lumbar range of motion is diminished approximately 50% with flexion extension lateral bending and rotation. Ambulates without assistive device no toe drag or knee instability       Assessment & Plan:  #1.  Lumbar pain right side appears to be facet degeneration on the left side appears to be new or discogenic. Currently does not have any severe symptoms on the right side therefore will not reinject. We will follow-up in 1 month we are planning on L4-L5 transforaminal injection.  We may also include L3-L4-L5 medial branch blocks at that time should he have recurrence of symptoms on the right side. Discussed with patient through an interpreter agrees with plan.

## 2017-01-03 NOTE — Patient Instructions (Signed)
May inject both sides of back next month if needed

## 2017-01-13 MED FILL — CELECOXIB 200 MG CAPSULE: 200 | 30 days supply | Qty: 30 | Fill #2

## 2017-01-30 ENCOUNTER — Ambulatory Visit: Payer: Medicaid Other | Admitting: Physical Medicine & Rehabilitation

## 2017-01-30 ENCOUNTER — Ambulatory Visit: Payer: Medicaid Other

## 2017-02-02 ENCOUNTER — Ambulatory Visit: Payer: Medicaid Other | Attending: Internal Medicine | Admitting: Internal Medicine

## 2017-02-02 ENCOUNTER — Encounter: Payer: Self-pay | Admitting: Internal Medicine

## 2017-02-02 VITALS — BP 108/72 | HR 77 | Temp 98.2°F | Resp 16 | Wt 144.8 lb

## 2017-02-02 DIAGNOSIS — G8929 Other chronic pain: Secondary | ICD-10-CM | POA: Diagnosis not present

## 2017-02-02 DIAGNOSIS — R7611 Nonspecific reaction to tuberculin skin test without active tuberculosis: Secondary | ICD-10-CM | POA: Insufficient documentation

## 2017-02-02 DIAGNOSIS — E785 Hyperlipidemia, unspecified: Secondary | ICD-10-CM | POA: Diagnosis not present

## 2017-02-02 DIAGNOSIS — Z87891 Personal history of nicotine dependence: Secondary | ICD-10-CM | POA: Insufficient documentation

## 2017-02-02 DIAGNOSIS — Z79899 Other long term (current) drug therapy: Secondary | ICD-10-CM | POA: Insufficient documentation

## 2017-02-02 DIAGNOSIS — G894 Chronic pain syndrome: Secondary | ICD-10-CM | POA: Diagnosis not present

## 2017-02-02 DIAGNOSIS — M5136 Other intervertebral disc degeneration, lumbar region: Secondary | ICD-10-CM | POA: Diagnosis not present

## 2017-02-02 DIAGNOSIS — K219 Gastro-esophageal reflux disease without esophagitis: Secondary | ICD-10-CM | POA: Diagnosis not present

## 2017-02-02 DIAGNOSIS — M545 Low back pain, unspecified: Secondary | ICD-10-CM

## 2017-02-02 MED ORDER — CELECOXIB 200 MG PO CAPS: 200.0000 mg | ORAL_CAPSULE | Freq: Every day | ORAL | 99 refills | Status: DC

## 2017-02-02 MED ORDER — OMEPRAZOLE 20 MG PO CPDR: 20.0000 mg | DELAYED_RELEASE_CAPSULE | Freq: Every day | ORAL | 3 refills | Status: DC | PRN

## 2017-02-02 MED ORDER — ATORVASTATIN CALCIUM 20 MG PO TABS: 20.0000 mg | ORAL_TABLET | Freq: Every day | ORAL | 99 refills | Status: DC

## 2017-02-02 MED FILL — OMEPRAZOLE DR 20 MG CAPSULE: 20 | 30 days supply | Qty: 30 | Fill #0

## 2017-02-02 MED FILL — ATORVASTATIN 20 MG TABLET: 20 | 30 days supply | Qty: 30 | Fill #0

## 2017-02-02 NOTE — Progress Notes (Signed)
Patient ID: Nathaniel Robinson, male    DOB: 1955-05-19  MRN: [id number]  CC: re-establish   Subjective: Nathaniel Robinson is a 61 y.o. male who presents for chronic ds management. Last saw Dr. Adrian Blackwater 08/2016. Pt is from [place].  He speaks [place]. Interpreter, Kibrom, from [employer], is with him.  His concerns today include:  Pt with hx of HL, chronic LBP  1. HL: tolerating Lipitor ok. Last lipid profile 08/2016 was good.  2. Chronic LBP Seeing Dr. Letta Pate. Doing good with injections On Celebrex and Tramadol.  Denies constipation or drowsiness with the latter. Reportedly takes the Tramadol once a day.  Patient Active Problem List   Diagnosis Date Noted  . Hyperlipidemia 03/14/2016  . Lumbar degenerative disc disease 09/11/2015  . Chronic pain syndrome 09/11/2015  . Pain in the chest 09/08/2015  . 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 Former heavy cigarette smoker (20-39 per day) 03/17/2014  . Chronic low back pain      Current Outpatient Medications on File Prior to Visit  Medication Sig Dispense Refill  . atorvastatin (LIPITOR) 20 MG tablet Take 1 tablet (20 mg total) by mouth daily. 30 tablet 11  . celecoxib (CELEBREX) 200 MG capsule Take 1 capsule (200 mg total) by mouth daily. 30 capsule 3  . traMADol (ULTRAM) 50 MG tablet Take 1 tablet (50 mg total) by mouth every 12 (twelve) hours as needed. 60 tablet 2   No current facility-administered medications on file prior to visit.     No Known Allergies  Social History   Socioeconomic History  . Marital status: Married    Spouse name: Not on file  . Number of children: 4  . Years of education: Not on file  . Highest education level: Not on file  Social Needs  . Financial resource strain: Not on file  . Food insecurity - worry: Not on file  . Food insecurity - inability: Not on file  . Transportation needs - medical: Not on file  . Transportation needs - non-medical: Not on file    Occupational History  . Occupation: Previous Optometrist     Comment: [place]   Tobacco Use  . Smoking status: Former Smoker    Packs/day: 0.75    Years: 25.00    Pack years: 18.75    Types: Cigarettes    Last attempt to quit: 09/19/2015    Years since quitting: 1.3  . Smokeless tobacco: Never Used  . Tobacco comment: 10 cigarettes a day   Substance and Sexual Activity  . Alcohol use: Yes    Alcohol/week: 0.0 oz    Comment: social   . Drug use: No  . Sexual activity: No  Other Topics Concern  . Not on file  Social History Narrative   Form [place]   Moved to [place] in 01/2014    Lives in [place] alone   Wife and 4 children in [place].     Family History  Problem Relation Age of Onset  . Cancer Mother        "cancer where they put baby", from description sounds uterine   . Heart disease Neg Hx   . Hypertension Neg Hx   . Diabetes Neg Hx   . Colon cancer Neg Hx     Past Surgical History:  Procedure Laterality Date  . COLONOSCOPY N/A 05/15/2015   Procedure: COLONOSCOPY;  Surgeon: Danie Binder, MD;  [facility];  Service: Endoscopy;  Laterality: N/A;  145 - moved to 1:30 - office to notify  . None      ROS: Review of Systems  Constitutional: Negative for activity change (does not do a lot of walking due to back pain. Can walk for about 30 mins before his back begins to bother him) and appetite change.  Eyes: Positive for visual disturbance (problems with near vision. Wears reading glass. Distant vision okay. Last eye exam was 8 yrs ago.).  Respiratory: Negative for cough and shortness of breath.   Cardiovascular: Negative for chest pain.  Gastrointestinal: Negative for blood in stool.       Moving bowels regularly. Some acid reflux symptoms when he eats spicy foods or drink occasional beer.  Genitourinary: Negative for dysuria, hematuria and urgency.  Neurological: Negative for dizziness and headaches.  Psychiatric/Behavioral: Negative for dysphoric mood. The  patient is not nervous/anxious.     PHYSICAL EXAM: BP 108/72   Pulse 77   Temp 98.2 F (36.8 C) (Oral)   Resp 16   Wt 144 lb 12.8 oz (65.7 kg)   SpO2 99%   BMI 22.68 kg/m   Wt Readings from Last 3 Encounters:  02/02/17 144 lb 12.8 oz (65.7 kg)  11/17/16 139 lb 9.6 oz (63.3 kg)  08/15/16 136 lb 3.2 oz (61.8 kg)    Physical Exam  General appearance - alert, well appearing, and in no distress Mental status - alert, oriented to person, place, and time, normal mood, behavior, speech, dress, motor activity, and thought processes Eyes - pupils equal and reactive, extraocular eye movements intact Mouth - mucous membranes moist, pharynx normal without lesions. Dentures above Neck - supple, no significant adenopathy Chest - clear to auscultation, no wheezes, rales or rhonchi, symmetric air entry Heart - normal rate, regular rhythm, normal S1, S2, no murmurs, rubs, clicks or gallops Abdomen - soft, nontender, nondistended, no masses or organomegaly Extremities - peripheral pulses normal, no pedal edema, no clubbing or cyanosis    Chemistry      Component Value Date/Time   NA 139 07/17/2016 0655   K 4.0 07/17/2016 0655   CL 107 07/17/2016 0655   CO2 25 07/17/2016 0655   BUN 9 07/17/2016 0655   CREATININE 0.69 07/17/2016 0655   CREATININE 0.68 (L) 09/08/2015 1041      Component Value Date/Time   CALCIUM 8.8 (L) 07/17/2016 0655   ALKPHOS 49 09/08/2015 1041   AST 22 09/08/2015 1041   ALT 19 09/08/2015 1041   BILITOT 0.5 09/08/2015 1041     Lab Results  Component Value Date   CHOL 146 08/15/2016   HDL 55 08/15/2016   LDLCALC 60 08/15/2016   TRIG 153 (H) 08/15/2016   CHOLHDL 2.7 08/15/2016     ASSESSMENT AND PLAN: 1. Chronic left-sided low back pain without sciatica -followed by pain specialist. Will continue Tramadol and Celebrex. He does not need the former RF right now - celecoxib (CELEBREX) 200 MG capsule; Take 1 capsule (200 mg total) by mouth daily.  Dispense: 90  capsule; Refill: PRN  2. Hyperlipidemia, unspecified hyperlipidemia type - atorvastatin (LIPITOR) 20 MG tablet; Take 1 tablet (20 mg total) by mouth daily.  Dispense: 90 tablet; Refill: prn - Hepatic Function Panel  3. Gastroesophageal reflux disease without esophagitis No alarm features and up to date with colon CA screen GERD precautions discussed. Given Omeprazole to use PRN.  - omeprazole (PRILOSEC) 20 MG capsule; Take 1 capsule (20 mg total) by mouth daily as needed.  Dispense:  30 capsule; Refill: 3  Patient was given the opportunity to ask questions.  Patient verbalized understanding of the plan and was able to repeat key elements of the plan.   No orders of the defined types were placed in this encounter.    Requested Prescriptions    No prescriptions requested or ordered in this encounter    F/u in 3 mths Karle Plumber, MD, Rosalita Chessman

## 2017-02-03 LAB — HEPATIC FUNCTION PANEL
ALK PHOS: 57 IU/L (ref 39–117)
ALT: 31 IU/L (ref 0–44)
AST: 23 IU/L (ref 0–40)
Albumin: 4.2 g/dL (ref 3.6–4.8)
BILIRUBIN TOTAL: 0.3 mg/dL (ref 0.0–1.2)
Bilirubin, Direct: 0.09 mg/dL (ref 0.00–0.40)
TOTAL PROTEIN: 6.3 g/dL (ref 6.0–8.5)

## 2017-02-10 ENCOUNTER — Encounter: Payer: Self-pay | Admitting: Physical Medicine & Rehabilitation

## 2017-02-10 ENCOUNTER — Ambulatory Visit: Payer: Medicaid Other | Admitting: Physical Medicine & Rehabilitation

## 2017-02-10 ENCOUNTER — Encounter: Payer: Medicaid Other | Attending: Physical Medicine & Rehabilitation

## 2017-02-10 VITALS — BP 122/81 | HR 81 | Resp 14

## 2017-02-10 DIAGNOSIS — M5416 Radiculopathy, lumbar region: Secondary | ICD-10-CM | POA: Insufficient documentation

## 2017-02-10 DIAGNOSIS — M5417 Radiculopathy, lumbosacral region: Secondary | ICD-10-CM | POA: Insufficient documentation

## 2017-02-10 NOTE — Progress Notes (Signed)
  PROCEDURE RECORD [facility]   Name: Nathaniel Robinson DOB:1955/07/11 MRN: [id number]  Date:02/10/2017  Physician: Alysia Penna, MD    Nurse/CMA: Eliana Lueth, CMA   Allergies: No Known Allergies  Consent Signed: Yes.    Is patient diabetic? No.  CBG today?   Pregnant: No. LMP: No LMP for male patient. (age 60-55)  Anticoagulants: no Anti-inflammatory: no Antibiotics: no  Procedure: transforaminal ESI  Position: Prone Start Time: 4:06pm      End Time: 4:14pm  Fluoro Time: 36s  RN/CMA Caitlin Ainley, CMA Qiara Minetti, CMA    Time 3:45pm 4:24pm    BP 122/81 125/78    Pulse 81 82    Respirations 14 14    O2 Sat 96 95    S/S 6 6    Pain Level 6/10 5/10     D/C home with public transportation, patient A & O X 3, D/C instructions reviewed, and sits independently.

## 2017-02-10 NOTE — Progress Notes (Signed)
LEFT L4 and LEFT L5 Lumbar transforaminal epidural steroid injections under fluoroscopic guidance  Indication: Lumbosacral radiculitis is not relieved by medication management or other conservative care and interfering with self-care and mobility.   Informed consent was obtained after describing risk and benefits of the procedure with the patient, this includes bleeding, bruising, infection, paralysis and medication side effects.  The patient wishes to proceed and has given written consent.  Patient was placed in prone position.  The lumbar area was marked and prepped with Betadine.  It was entered with a 25-gauge 1-1/2 inch needle and one mL of 1% lidocaine was injected into the skin and subcutaneous tissue.  Then a 22-gauge 3.5 in spinal needle was inserted into the Left L4-5 intervertebral foramen under AP, lateral, and oblique view.  Then a solution containing one mL of 10 mg per mL dexamethasone and 2 mL of 1% lidocaine was injected. Then the same procedure was repeated at left L5-S1. Using same needle, injectate and technique. The patient tolerated procedure well.  Post procedure instructions were given.  Please see post procedure form.

## 2017-03-07 MED FILL — OMEPRAZOLE DR 20 MG CAPSULE: 20 | 30 days supply | Qty: 30 | Fill #1

## 2017-03-07 MED FILL — traMADol HCL 50 MG TABS: 50 | 30 days supply | Qty: 60 | Fill #2

## 2017-03-07 MED FILL — ATORVASTATIN 20 MG TABLET: 20 | 30 days supply | Qty: 30 | Fill #1

## 2017-03-10 ENCOUNTER — Encounter: Payer: Medicaid Other | Attending: Physical Medicine & Rehabilitation

## 2017-03-10 ENCOUNTER — Ambulatory Visit: Payer: Medicaid Other | Admitting: Physical Medicine & Rehabilitation

## 2017-03-10 ENCOUNTER — Encounter: Payer: Self-pay | Admitting: Physical Medicine & Rehabilitation

## 2017-03-10 VITALS — BP 113/73 | HR 73 | Resp 14

## 2017-03-10 DIAGNOSIS — M5417 Radiculopathy, lumbosacral region: Secondary | ICD-10-CM | POA: Diagnosis present

## 2017-03-10 DIAGNOSIS — M47816 Spondylosis without myelopathy or radiculopathy, lumbar region: Secondary | ICD-10-CM

## 2017-03-10 NOTE — Progress Notes (Signed)
  PROCEDURE RECORD [facility]   Name: Chanan Detwiler DOB:1955-07-26 MRN: [id number]  Date:03/10/2017  Physician: Alysia Penna, MD    Nurse/CMA: Catrice Zuleta, CMA  Allergies: No Known Allergies  Consent Signed: Yes.    Is patient diabetic? No.  CBG today?   Pregnant: No. LMP: No LMP for male patient. (age 61-55)  Anticoagulants: no Anti-inflammatory: no Antibiotics: no  Procedure: right L3,4,5 medial branch block (3.5 cm black hub) Position: Prone Start Time: 9:59am   End Time: 10:05am Fluoro Time: 12s  RN/CMA Dynastie Knoop, CMA Ramil Edgington, CMA    Time 9:45am 10:08 am    BP 113/73 114/70    Pulse 73 72    Respirations 14 14    O2 Sat 96 96    S/S 6 6    Pain Level 6/10 5/10     D/C home with public transportation, patient A & O X 3, D/C instructions reviewed, and sits independently.

## 2017-03-10 NOTE — Progress Notes (Signed)
  Right lumbar L3, L4 medial branch blocks and L5 dorsal ramus injection under fluoroscopic guidance  Indication: Right Lumbar pain which is not relieved by medication management or other conservative care and interfering with self-care and mobility.  Informed consent was obtained after describing risks and benefits of the procedure with the patient, this includes bleeding, bruising, infection, paralysis and medication side effects. The patient wishes to proceed and has given written consent. The patient was placed in a prone position. The lumbar area was marked and prepped with Betadine. One ML of 1% lidocaine was injected into each of 3 areas into the skin and subcutaneous tissue. Then a 22-gauge 3.5inch spinal needle was inserted targeting the junction of the Right S1 superior articular process and sacral ala junction. Needle was advanced under fluoroscopic guidance. Bone contact was made.Isovue 200 was injected x0.5 mL demonstrating no intravascular uptake. Then a solution containing 2% MPF lidocaine was injected x0.5 mL. Then the Right L5 superior articular process in transverse process junction was targeted. Bone contact was made.Isovue 200 was injected x0.5 mL demonstrating no intravascular uptake. Then a solution containing 2% MPF lidocaine was injected x0.5 mL. Then the Right L4 superior articular process in transverse process junction was targeted. Bone contact was made. Isovue 200 was injected x0.5 mL demonstrating no intravascular uptake. Then a solution containing2% MPF lidocaine was injected x0.5 mL Patient tolerated procedure well. Post procedure instructions were given. Please refer to post procedure form.

## 2017-03-10 NOTE — Patient Instructions (Signed)

## 2017-03-20 MED FILL — CELECOXIB 200 MG CAPSULE: 200 | 30 days supply | Qty: 30 | Fill #3

## 2017-04-06 MED FILL — ATORVASTATIN 20 MG TABLET: 20 | 30 days supply | Qty: 30 | Fill #2

## 2017-04-20 MED FILL — CELECOXIB 200 MG CAPSULE: 200 | 30 days supply | Qty: 30 | Fill #0

## 2017-04-21 ENCOUNTER — Ambulatory Visit: Payer: Medicaid Other | Admitting: Physical Medicine & Rehabilitation

## 2017-04-21 ENCOUNTER — Ambulatory Visit: Payer: Medicaid Other

## 2017-04-24 ENCOUNTER — Encounter: Payer: Medicaid Other | Attending: Physical Medicine & Rehabilitation

## 2017-04-24 ENCOUNTER — Ambulatory Visit: Payer: Medicaid Other | Admitting: Physical Medicine & Rehabilitation

## 2017-04-24 ENCOUNTER — Other Ambulatory Visit: Payer: Self-pay

## 2017-04-24 ENCOUNTER — Encounter: Payer: Self-pay | Admitting: Physical Medicine & Rehabilitation

## 2017-04-24 VITALS — BP 132/80 | HR 87

## 2017-04-24 DIAGNOSIS — M5416 Radiculopathy, lumbar region: Secondary | ICD-10-CM | POA: Diagnosis not present

## 2017-04-24 DIAGNOSIS — M47816 Spondylosis without myelopathy or radiculopathy, lumbar region: Secondary | ICD-10-CM | POA: Diagnosis not present

## 2017-04-24 DIAGNOSIS — M5417 Radiculopathy, lumbosacral region: Secondary | ICD-10-CM | POA: Diagnosis not present

## 2017-04-24 NOTE — Patient Instructions (Signed)
[place] LEFT SIDE NEXT VISIT

## 2017-04-24 NOTE — Progress Notes (Signed)
Subjective:    Patient ID: Nathaniel Robinson, male    DOB: [birth year], 62 y.o.   MRN: [id number]  HPI 62 year old male originally from [place] with a 8-year history of low back pain which started after he lifted a large rock.  He has had no weakness in the legs no bowel or bladder dysfunction.  He has had treatment with medications as well as therapy as well as injections.  Injections seem to be helping him the most.  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  Patient gets about 3 months relief with these procedures  Patient had right L3-L4 medial branch and right L5 dorsal ramus injection under fluoroscopic guidance 12/05/2016 with 50% relief following this procedure.  This was repeated on 03/10/2017 and the patient has not had any pain in the right side of the low back since that time.  He is now complaining of some increasing pain in the left side Is left-sided L3-L4 lumbar transforaminal injections last performed on 02/10/2017 Pain Inventory Average Pain 4 Pain Right Now 4 My pain is na  In the last 24 hours, has pain interfered with the following? General activity 5 Relation with others 5 Enjoyment of life 5 What TIME of day is your pain at its worst? morning daytime and evening Sleep (in general) Fair  Pain is worse with: walking, bending and standing Pain improves with: rest, medication and injections Relief from Meds: 6  Mobility walk without assistance how many minutes can you walk? 30 do you drive?  no  Function disabled: date disabled 07/2014  Neuro/Psych No  problems in this area  Prior Studies Any changes since last visit?  no  Physicians involved in your care Any changes since last visit?  no   Family History  Problem Relation Age of Onset  . Cancer Mother        "cancer where they put baby", from description sounds uterine   . Heart disease Neg Hx   . Hypertension Neg Hx   . Diabetes Neg Hx   . Colon cancer Neg Hx    Social History   Socioeconomic History  . Marital status: Married    Spouse name: None  . Number of children: 4  . Years of education: None  . Highest education level: None  Social Needs  . Financial resource strain: None  . Food insecurity - worry: None  . Food insecurity - inability: None  . Transportation needs - medical: None  . Transportation needs - non-medical: None  Occupational History  . Occupation: Previous Optometrist     Comment: [place]   Tobacco Use  . Smoking status: Former Smoker    Packs/day: 0.75    Years: 25.00    Pack years: 18.75    Types: Cigarettes    Last attempt to quit: 09/19/2015    Years since quitting: 1.5  . Smokeless tobacco: Never Used  . Tobacco comment: 10 cigarettes a day   Substance and Sexual Activity  . Alcohol use: Yes    Alcohol/week: 0.0 oz    Comment: social   . Drug use: No  . Sexual activity: No  Other Topics Concern  . None  Social History Narrative   Form [place]   Moved to [place] in 01/2014    Lives in [place] alone   Wife and 4 children in [place].    Past Surgical History:  Procedure Laterality Date  . COLONOSCOPY N/A 05/15/2015   Procedure: COLONOSCOPY;  Surgeon: Danie Binder, MD;  [facility];  Service: Endoscopy;  Laterality: N/A;  145 - moved to 1:30 - office to notify  . None     Past Medical History:  Diagnosis Date  . Chronic low back pain 2010    BP 132/80   Pulse 87   SpO2 98%   Opioid Risk Score:   Fall Risk Score:  `1  Depression screen PHQ 2/9  Depression screen [facility] 2/9 04/24/2017 02/02/2017 12/05/2016 10/31/2016  08/15/2016 08/15/2016 07/21/2016  Decreased Interest 0 0 0 0 0 0 0  Down, Depressed, Hopeless 0 0 0 0 0 0 0  PHQ - 2 Score 0 0 0 0 0 0 0  Altered sleeping - - - - 0 0 -  Tired, decreased energy - - - - 0 0 -  Change in appetite - - - - 0 0 -  Feeling bad or failure about yourself  - - - - 0 0 -  Trouble concentrating - - - - 0 0 -  Moving slowly or fidgety/restless - - - - 0 0 -  Suicidal thoughts - - - - 0 0 -  PHQ-9 Score - - - - 0 0 -   Review of Systems  Constitutional: Negative.   HENT: Negative.   Eyes: Negative.   Respiratory: Negative.   Cardiovascular: Negative.   Gastrointestinal: Negative.   Endocrine: Negative.   Genitourinary: Negative.   Musculoskeletal: Positive for back pain.  Skin: Negative.   Allergic/Immunologic: Negative.   Neurological: Negative.   Hematological: Negative.   Psychiatric/Behavioral: Negative.   All other systems reviewed and are negative.      Objective:   Physical Exam  Constitutional: He is oriented to person, place, and time. He appears well-developed and well-nourished.  HENT:  Head: Normocephalic and atraumatic.  Eyes: Conjunctivae are normal. Pupils are equal, round, and reactive to light.  Musculoskeletal:  Lumbar spine his normal forward flexion extension is limited to 50% lateral bending to 50%.  He has most pain with lateral bending toward the right and to the left side. Negative straight leg raising Normal strength bilateral hip flexor knee extensor ankle dorsiflexor. Sensation is normal to light touch in both lower limbs  Neurological: He is alert and oriented to person, place, and time.  Psychiatric: He has a normal mood and affect. His behavior is normal. Judgment and thought content normal.  Nursing note and vitals reviewed.         Assessment & Plan:  #1.  Right lumbar spondylosis has responded on 2 occasions to L3-L4 medial branch and L5 dorsal ramus injection.  Greater than 50% relief was obtained.  He has a  prolonged response to the last injection performed about 6 weeks ago.  We will not schedule re-injection at this time.  If he continues to get prolonged relief with medial branch blocks we will not need to do radiofrequency but if duration response is less than 3 months would recommend this  2.  Left L4 and left L5 chronic radiculitis relieved with left L4 and left L5 transforaminal injections done under fluoroscopic guidance.  This can be repeated in about  2 weeks.

## 2017-05-04 MED FILL — ATORVASTATIN 20 MG TABLET: 20 | 30 days supply | Qty: 30 | Fill #3

## 2017-05-08 ENCOUNTER — Other Ambulatory Visit: Payer: Self-pay | Admitting: Physician Assistant

## 2017-05-08 DIAGNOSIS — M545 Low back pain, unspecified: Secondary | ICD-10-CM

## 2017-05-08 DIAGNOSIS — G8929 Other chronic pain: Secondary | ICD-10-CM

## 2017-05-16 ENCOUNTER — Ambulatory Visit: Payer: Medicaid Other

## 2017-05-22 ENCOUNTER — Ambulatory Visit: Payer: Medicaid Other

## 2017-05-22 ENCOUNTER — Ambulatory Visit: Payer: Medicaid Other | Admitting: Physical Medicine & Rehabilitation

## 2017-05-23 ENCOUNTER — Encounter: Payer: Self-pay | Admitting: Physical Medicine & Rehabilitation

## 2017-05-23 ENCOUNTER — Encounter: Payer: Medicaid Other | Attending: Physical Medicine & Rehabilitation

## 2017-05-23 ENCOUNTER — Ambulatory Visit: Payer: Medicaid Other | Admitting: Physical Medicine & Rehabilitation

## 2017-05-23 VITALS — BP 122/87 | HR 81 | Ht 66.0 in | Wt 141.0 lb

## 2017-05-23 DIAGNOSIS — M47816 Spondylosis without myelopathy or radiculopathy, lumbar region: Secondary | ICD-10-CM

## 2017-05-23 DIAGNOSIS — M5417 Radiculopathy, lumbosacral region: Secondary | ICD-10-CM | POA: Insufficient documentation

## 2017-05-23 NOTE — Progress Notes (Signed)
  PROCEDURE RECORD [facility]   Name: Nathaniel Robinson DOB:September 27, 1955 MRN: [id number]  Date:05/23/2017  Physician: Alysia Penna, MD    Nurse/CMA: Bright CMA  Allergies: No Known Allergies  Consent Signed: Yes.    Is patient diabetic? No.  CBG today? NA  Pregnant: No. LMP: No LMP for male patient. (age 61-55)  Anticoagulants: no Anti-inflammatory: no Antibiotics: no  Procedure: Left L3-5 MBB Position: Prone   Start Time: 1202pm End Time: 1209pm Fluoro Time: 41s  RN/CMA [place] CMA Bright CMA    Time 1137am 1216pm    BP 122/87 124/71    Pulse 81 80    Respirations 16 16    O2 Sat 98 98    S/S 6 6    Pain Level 7/10 5/10     D/C home with public transportation, patient A & O X 3, D/C instructions reviewed, and sits independently.

## 2017-05-23 NOTE — Progress Notes (Signed)
Left Lumbar L3, L4  medial branch blocks and L 5 dorsal ramus injection under fluoroscopic guidance   Indication: Left Lumbar pain which is not relieved by medication management or other conservative care and interfering with self-care and mobility.  Informed consent was obtained after describing risks and benefits of the procedure with the patient, this includes bleeding, bruising, infection, paralysis and medication side effects.  The patient wishes to proceed and has given written consent.  The patient was placed in a prone position.  The lumbar area was marked and prepped with Betadine.  One mL of 1% lidocaine was injected into each of 3 areas into the skin and subcutaneous tissue.  Then a 22-gauge 3.5in spinal needle was inserted targeting the junction of the left S1 superior articular process and sacral ala junction.  Needle was advanced under fluoroscopic guidance.  Bone contact was made. Isovue 200 was injected x 0.5 mL demonstrating no intravascular uptake.  Then a solution containing  2% MPF lidocaine was injected x 0.5 mL.  Then the left L5 superior articular process in transverse process junction was targeted.  Bone contact was made. Isovue 200 was injected x 0.5 mL demonstrating no intravascular uptake.  Then a solution containing 2% MPF lidocaine was injected x 0.5 mL.  Then the left L4 superior articular process in transverse process junction was targeted.  Bone contact was made.  Isovue 200 was injected x 0.5 mL demonstrating no intravascular uptake.  Then a solution containing  2% MPF lidocaine was injected x 0.5 mL.  Patient tolerated procedure well.  Post procedure instructions were given. 

## 2017-05-25 ENCOUNTER — Ambulatory Visit: Payer: Medicaid Other | Attending: Physical Medicine & Rehabilitation | Admitting: Physical Therapy

## 2017-05-25 ENCOUNTER — Other Ambulatory Visit: Payer: Self-pay

## 2017-05-25 ENCOUNTER — Encounter: Payer: Self-pay | Admitting: Physical Therapy

## 2017-05-25 DIAGNOSIS — G8929 Other chronic pain: Secondary | ICD-10-CM | POA: Insufficient documentation

## 2017-05-25 DIAGNOSIS — M545 Low back pain: Secondary | ICD-10-CM | POA: Diagnosis present

## 2017-05-25 DIAGNOSIS — M6281 Muscle weakness (generalized): Secondary | ICD-10-CM | POA: Insufficient documentation

## 2017-05-25 DIAGNOSIS — R293 Abnormal posture: Secondary | ICD-10-CM | POA: Insufficient documentation

## 2017-05-25 NOTE — Therapy (Addendum)
[place], [place], [address] Phone: [phone]   Fax:  [phone]  Physical Therapy Evaluation  Patient Details  Name: Nathaniel Robinson MRN: [id number] Date of Birth: 1956/01/22 Referring Provider: Charlett Blake, MD   Encounter Date: 05/25/2017  PT End of Session - 05/25/17 1029    Visit Number  1    Number of Visits  13    Date for PT Re-Evaluation  07/06/17    Authorization Type  MCD (Re-assess/ re-submit at 4th visit)    PT Start Time  1108 late due to interpreter arriving 8 min late    PT Stop Time  36    PT Time Calculation (min)  40 min    Activity Tolerance  Patient tolerated treatment well    Behavior During Therapy  [facility] for tasks assessed/performed       Past Medical History:  Diagnosis Date  . Chronic low back pain 2010     Past Surgical History:  Procedure Laterality Date  . COLONOSCOPY N/A 05/15/2015   Procedure: COLONOSCOPY;  Surgeon: Danie Binder, MD;  [facility];  Service: Endoscopy;  Laterality: N/A;  145 - moved to 1:30 - office to notify  . None      There were no vitals filed for this visit.   Subjective Assessment - 05/25/17 1028    Subjective  pt is a 62 y.o F with CC of low back pain that started about 9 years ago that occurred after he was picking up a heavy stone and that the pain progressively worsened. pain stays in the low back and fluctuates from the L to R but stays mostly on the L.  no previouls hx of low back pain. pt reports he had received a shot in the low back which helpd.     Limitations  Lifting    How long can you sit comfortably?  1-2 hours    How long can you stand comfortably?  30 min    How long can you walk comfortably?  30 min     Diagnostic tests  x-ray and MRI    Patient Stated Goals  to decrease back pain     Currently in Pain?  Yes    Pain Score  5  at worst 7/10    Pain Location  Back    Pain Orientation  Right;Left;Lower     Pain Descriptors / Indicators  Constant;Sore;Aching    Pain Type  Chronic pain    Pain Onset  More than a month ago    Pain Frequency  Constant    Aggravating Factors   standing/ walking for long periods of time, bending forward    Pain Relieving Factors  injection,     Effect of Pain on Daily Activities  limited walking/ standing endurance         [facility] PT Assessment - 05/25/17 1032      Assessment   Medical Diagnosis  Spondylosis without myelopathy or radiculopathy, lumbar region    Referring Provider  Charlett Blake, MD    Onset Date/Surgical Date  -- 9 years ago     Hand Dominance  Right    Next MD Visit  -- 6 weeks    Prior Therapy  no      Precautions   Precautions  None      Restrictions   Weight Bearing Restrictions  No      Balance Screen   Has  the patient fallen in the past 6 months  No    Has the patient had a decrease in activity level because of a fear of falling?   No    Is the patient reluctant to leave their home because of a fear of falling?   No      Home Environment   Living Environment  Private residence    Living Arrangements  Non-relatives/Friends    Type of [place] to enter    Entrance Stairs-Number of Steps  10    Entrance Stairs-Rails  Can reach both    [place]  One level      Prior Function   Level of Independence  Independent with basic ADLs    Vocation  On disability      Cognition   Overall Cognitive Status  Within Functional Limits for tasks assessed      Posture/Postural Control   Posture/Postural Control  Postural limitations    Postural Limitations  Rounded Shoulders;Forward head      ROM / Strength   AROM / PROM / Strength  AROM;Strength      AROM   AROM Assessment Site  Lumbar    Lumbar Flexion  60    Lumbar Extension  20    Lumbar - Right Side Bend  20    Lumbar - Left Side Bend  20      Strength   Strength Assessment Site  Hip;Knee    Right/Left Hip  Right;Left    Right Hip  Flexion  4+/5    Right Hip Extension  4/5    Right Hip ABduction  4-/5    Left Hip Flexion  4+/5    Left Hip Extension  4/5    Left Hip ABduction  4-/5    Left Hip ADduction  4-/5    Right/Left Knee  Left;Right    Right Knee Flexion  4+/5    Right Knee Extension  4+/5    Left Knee Flexion  4+/5    Left Knee Extension  4+/5      Palpation   Palpation comment  TTP along the PSIS on the L       Special Tests    Special Tests  Lumbar;Sacrolliac Tests    Lumbar Tests  Slump Test;Prone Knee Bend Test;Straight Leg Raise    Sacroiliac Tests   Pelvic Compression      Slump test   Findings  Negative      Prone Knee Bend Test   Findings  Negative      Straight Leg Raise   Findings  Negative      Pelvic Compression   Findings  Negative      Sacral thrust    Findings  Positive    Side  Left      Gaenslen's test   Findings  Negative      Ambulation/Gait   Ambulation/Gait  Yes    Gait Pattern  Within Functional Limits                Objective measurements completed on examination: See above findings.      [facility] Adult PT Treatment/Exercise - 05/25/17 1032      Lumbar Exercises: Supine   Pelvic Tilt  10 reps 5 sec hold, with tactile cues for proper form      Knee/Hip Exercises: Stretches   Active Hamstring Stretch  2 reps;30 seconds      Knee/Hip  Exercises: Supine   Straight Leg Raises  2 sets;10 reps;Left;Strengthening      Manual Therapy   Manual Therapy  Muscle Energy Technique    Muscle Energy Technique  resisted L hip flexion 10 x 5 sec hold             PT Education - 05/25/17 1029    Education provided  Yes    Education Details  evaluation findings, POC, goals, HEP with proper form/ rationale    Person(s) Educated  Patient    Methods  Explanation;Demonstration;Handout    Comprehension  Verbalized understanding;Returned demonstration;Verbal cues required       PT Short Term Goals - 05/25/17 1035      PT SHORT TERM GOAL #1   Title  pt  will be I with intial HEP    Baseline  no previous HEP    Time  3    Period  Weeks    Status  New    Target Date  06/15/17      PT SHORT TERM GOAL #2   Title  pt to verbalize/ demo proper posture in mutliple positions and lifting mechanics to prevent and reduce low back pain     Baseline  no knowledge of proper posture    Time  3    Period  Weeks    Status  New    Target Date  06/15/17        PT Long Term Goals - 05/25/17 1041      PT LONG TERM GOAL #1   Title  increase trunk flexion to >/= 80 degrees to promote functional trunk mobility required for ADLs     Baseline  60 degrees flexion with end range soreness.     Time  6    Period  Weeks    Status  New    Target Date  07/06/17      PT LONG TERM GOAL #2   Title  pt to be able to walk/ standing for >/= 60 min with </= 2/10 pain for functional endurnace required for community ambulation and ADLs    Baseline  walking/ standing 30 min with 6/10 pain    Time  6    Period  Weeks    Status  New    Target Date  07/06/17      PT LONG TERM GOAL #3   Title  pt to be I with all HEP given as of last visit to maintain and progress current level of function     Baseline  no previous HEP    Time  6    Period  Weeks    Status  New    Target Date  06/15/17             Plan - 05/25/17 1058    Clinical Impression Statement  pt presents to OPPT with CC of low back pain started 9 years ago secondary to picking up a heavy stone, and reports fluctuating symptoms based on activity. He demonstrates limited trunk flexion but overall functional mobility. functional strength in bil LE. TTP noted at the L PSIS with tightness in bil lumbar paraspinals. He demonstrates positive findings for possibe posteriorly rotated innominate on the L. He would benefit from physical therapy to increase back mobility, reduce pain, improve walking/ standing tolerance.     Clinical Presentation  Stable    Clinical Decision Making  Low    Rehab Potential   Good    PT Frequency  1x / week    PT Duration  3 weeks progressing to 2 x week for 4 weeks following inital authorization    PT Treatment/Interventions  ADLs/Self Care Home Management;Cryotherapy;Electrical Stimulation;Iontophoresis 4mg /ml Dexamethasone;Moist Heat;Traction;Ultrasound;Neuromuscular re-education;Patient/family education;Therapeutic activities;Therapeutic exercise;Functional mobility training;Manual techniques;Taping;Dry needling;Passive range of motion    PT Next Visit Plan  review and update HEP, core strength, trunk mobility, posterior innominate rotation on L. modalities PRN    PT Home Exercise Plan  lower trunk rotation, hamstring stretch, supine pelvic tilts, SLR    Consulted and Agree with Plan of Care  Patient       Patient will benefit from skilled therapeutic intervention in order to improve the following deficits and impairments:  Pain, Improper body mechanics, Postural dysfunction, Decreased strength, Decreased activity tolerance, Decreased endurance, Decreased knowledge of use of DME  Visit Diagnosis: Chronic bilateral low back pain, with sciatica presence unspecified - Plan: PT plan of care cert/re-cert  Abnormal posture - Plan: PT plan of care cert/re-cert  Muscle weakness (generalized) - Plan: PT plan of care cert/re-cert     Problem List Patient Active Problem List   Diagnosis Date Noted  . Chronic lumbar radiculopathy 02/10/2017  . Hyperlipidemia 03/14/2016  . Lumbar degenerative disc disease 09/11/2015  . Chronic pain syndrome 09/11/2015  . Pain in the chest 09/08/2015  . 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 Former heavy cigarette smoker (20-39 per day) 03/17/2014  . Chronic low back pain    Starr Lake PT, DPT, LAT, ATC  05/25/17  12:05 PM      [facility] [address] [place], [place], [address] Phone: [phone]   Fax:  [phone]  Name:  Jamon Hayhurst MRN: [id number] Date of Birth: 09/14/1955

## 2017-06-01 MED FILL — ATORVASTATIN 20 MG TABLET: 20 | 30 days supply | Qty: 30 | Fill #4

## 2017-06-09 ENCOUNTER — Encounter: Payer: Self-pay | Admitting: Physical Therapy

## 2017-06-09 ENCOUNTER — Ambulatory Visit: Payer: Medicaid Other | Admitting: Physical Therapy

## 2017-06-09 DIAGNOSIS — M6281 Muscle weakness (generalized): Secondary | ICD-10-CM

## 2017-06-09 DIAGNOSIS — R293 Abnormal posture: Secondary | ICD-10-CM

## 2017-06-09 DIAGNOSIS — G8929 Other chronic pain: Secondary | ICD-10-CM

## 2017-06-09 DIAGNOSIS — M545 Low back pain: Principal | ICD-10-CM

## 2017-06-09 NOTE — Therapy (Signed)
[place], [place], [address] Phone: [phone]   Fax:  [phone]  Physical Therapy Treatment  Patient Details  Name: Rodell Marrs MRN: [id number] Date of Birth: January 14, 1956 Referring Provider: Charlett Blake, MD   Encounter Date: 06/09/2017  PT End of Session - 06/09/17 0759    Visit Number  2    Number of Visits  13    Date for PT Re-Evaluation  07/06/17    Authorization Type  MCD (Re-assess/ re-submit at 4th visit)    PT Start Time  0800    PT Stop Time  0855    PT Time Calculation (min)  55 min    Activity Tolerance  Patient tolerated treatment well    Behavior During Therapy  [facility] for tasks assessed/performed       Past Medical History:  Diagnosis Date  . Chronic low back pain 2010     Past Surgical History:  Procedure Laterality Date  . COLONOSCOPY N/A 05/15/2015   Procedure: COLONOSCOPY;  Surgeon: Danie Binder, MD;  [facility];  Service: Endoscopy;  Laterality: N/A;  145 - moved to 1:30 - office to notify  . None      There were no vitals filed for this visit.  Subjective Assessment - 06/09/17 0803    Subjective  "I still feel pain inthe L low back    Currently in Pain?  Yes    Pain Score  5     Pain Location  Back    Pain Orientation  Right;Left;Lower L>R    Pain Onset  More than a month ago    Pain Frequency  Constant    Aggravating Factors   standing/ walking for long periods of time, bending.     Pain Relieving Factors  injection                       [facility] Adult PT Treatment/Exercise - 06/09/17 0001      Lumbar Exercises: Stretches   Active Hamstring Stretch  4 reps;30 seconds PNF contact / relax      Lumbar Exercises: Supine   Pelvic Tilt  15 reps;5 seconds    Dead Bug  10 reps;5 seconds      Knee/Hip Exercises: Supine   Hip Adduction Isometric  2 sets;10 reps;Both with pink ball    Straight Leg Raises  2 sets;15 reps;Left     Straight Leg Raise with External Rotation  -- tactile cues to avoid going too high    Other Supine Knee/Hip Exercises  clams 2 x 15 with green theraband      Modalities   Modalities  Moist Heat      Moist Heat Therapy   Number Minutes Moist Heat  10 Minutes    Moist Heat Location  Lumbar Spine in supine      Manual Therapy   Manual Therapy  Joint mobilization    Manual therapy comments  MTPR along the L proxmial hamstring x 3    Joint Mobilization  L innominate anterior mob grade 3, LAD grade 5 for LLE only    Muscle Energy Technique  resisted L hip flexion 10 x 5 sec hold             PT Education - 06/09/17 0759    Education provided  Yes    Education Details  reivewed previously provided HEP, cues on proper form of HEP    Person(s) Educated  Patient    Methods  Explanation;Verbal cues    Comprehension  Verbalized understanding;Verbal cues required       PT Short Term Goals - 05/25/17 1035      PT SHORT TERM GOAL #1   Title  pt will be I with intial HEP    Baseline  no previous HEP    Time  3    Period  Weeks    Status  New    Target Date  06/15/17      PT SHORT TERM GOAL #2   Title  pt to verbalize/ demo proper posture in mutliple positions and lifting mechanics to prevent and reduce low back pain     Baseline  no knowledge of proper posture    Time  3    Period  Weeks    Status  New    Target Date  06/15/17        PT Long Term Goals - 05/25/17 1041      PT LONG TERM GOAL #1   Title  increase trunk flexion to >/= 80 degrees to promote functional trunk mobility required for ADLs     Baseline  60 degrees flexion with end range soreness.     Time  6    Period  Weeks    Status  New    Target Date  07/06/17      PT LONG TERM GOAL #2   Title  pt to be able to walk/ standing for >/= 60 min with </= 2/10 pain for functional endurnace required for community ambulation and ADLs    Baseline  walking/ standing 30 min with 6/10 pain    Time  6    Period  Weeks     Status  New    Target Date  07/06/17      PT LONG TERM GOAL #3   Title  pt to be I with all HEP given as of last visit to maintain and progress current level of function     Baseline  no previous HEP    Time  6    Period  Weeks    Status  New    Target Date  06/15/17            Plan - 06/09/17 9767    Clinical Impression Statement  pt reprots continued pain inth eback and has been consistent with his HEP but required cues for proper form/ time length due to him holding hamsting stretch for 30 min versus 30 sec. continued working on innominate rotation which he reported minimal relief. progress is difficulty to assess due to limited language translation. utlilized MHP end of session to calm down soreness.     PT Next Visit Plan   update HEP, core strength, trunk mobility, posterior innominate rotation on L. modalities PRN, continued hip strengthening/ core activation    PT Home Exercise Plan  lower trunk rotation, hamstring stretch, supine pelvic tilts, SLR    Consulted and Agree with Plan of Care  Patient       Patient will benefit from skilled therapeutic intervention in order to improve the following deficits and impairments:  Pain, Improper body mechanics, Postural dysfunction, Decreased strength, Decreased activity tolerance, Decreased endurance, Decreased knowledge of use of DME  Visit Diagnosis: Chronic bilateral low back pain, with sciatica presence unspecified  Abnormal posture  Muscle weakness (generalized)     Problem List Patient Active Problem List   Diagnosis Date Noted  . Chronic lumbar  radiculopathy 02/10/2017  . Hyperlipidemia 03/14/2016  . Lumbar degenerative disc disease 09/11/2015  . Chronic pain syndrome 09/11/2015  . Pain in the chest 09/08/2015  . 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 Former heavy cigarette smoker (20-39 per day) 03/17/2014  . Chronic low back pain    Starr Lake PT, DPT, LAT, ATC   06/09/17  8:41 AM      [facility] [address] [place], [place], [address] Phone: [phone]   Fax:  [phone]  Name: Jakyle Petrucelli MRN: [id number] Date of Birth: [birth year]

## 2017-06-13 ENCOUNTER — Encounter: Payer: Self-pay | Admitting: Physical Therapy

## 2017-06-13 ENCOUNTER — Ambulatory Visit: Payer: Medicaid Other | Admitting: Physical Therapy

## 2017-06-13 DIAGNOSIS — R293 Abnormal posture: Secondary | ICD-10-CM

## 2017-06-13 DIAGNOSIS — M6281 Muscle weakness (generalized): Secondary | ICD-10-CM

## 2017-06-13 DIAGNOSIS — M545 Low back pain: Secondary | ICD-10-CM | POA: Diagnosis not present

## 2017-06-13 DIAGNOSIS — G8929 Other chronic pain: Secondary | ICD-10-CM

## 2017-06-13 NOTE — Therapy (Signed)
[place], [place], [address] Phone: [phone]   Fax:  [phone]  Physical Therapy Treatment  Patient Details  Name: Nathaniel Robinson MRN: [id number] Date of Birth: 09/27/1955 Referring Provider: Charlett Blake, MD   Encounter Date: 06/13/2017  PT End of Session - 06/13/17 0947    Visit Number  3    Number of Visits  13    Date for PT Re-Evaluation  07/06/17    Authorization Type  MCD (Re-assess/ re-submit at 4th visit)    PT Start Time  0946 pt arrived 16 min late today    PT Stop Time  1020    PT Time Calculation (min)  34 min    Activity Tolerance  Patient tolerated treatment well       Past Medical History:  Diagnosis Date  . Chronic low back pain 2010     Past Surgical History:  Procedure Laterality Date  . COLONOSCOPY N/A 05/15/2015   Procedure: COLONOSCOPY;  Surgeon: Danie Binder, MD;  [facility];  Service: Endoscopy;  Laterality: N/A;  145 - moved to 1:30 - office to notify  . None      There were no vitals filed for this visit.  Subjective Assessment - 06/13/17 0945    Subjective  "I am still feeling about the same"     Pain Score  5     Pain Location  Back    Pain Orientation  Right;Left    Pain Descriptors / Indicators  Aching;Sore    Pain Type  Chronic pain    Pain Onset  More than a month ago    Pain Frequency  Constant                       [facility] Adult PT Treatment/Exercise - 06/13/17 0950      Lumbar Exercises: Stretches   Lower Trunk Rotation  -- 2 x 10    Other Lumbar Stretch Exercise  seated low back stretch walking hands out on red physioball 2 x 30 sec hold      Lumbar Exercises: Supine   Pelvic Tilt  10 reps;5 seconds in decompression position    Bent Knee Raise  20 reps with legs on physioball     Dead Bug  5 reps 10 sec hold    Other Supine Lumbar Exercises  LTR with legs on physioball for decompression 2 x 10      Knee/Hip  Exercises: Stretches   Active Hamstring Stretch  4 reps;30 seconds PNF contraction/ relax      Knee/Hip Exercises: Aerobic   Nustep  L5 x 5 min bil UE/LE      Moist Heat Therapy   Number Minutes Moist Heat  10 Minutes    Moist Heat Location  Lumbar Spine in supine      Manual Therapy   Joint Mobilization  L innominate anterior mob grade 3, LAD grade 5 for LLE only    Muscle Energy Technique  resisted L hip flexion 10 x 5 sec hold               PT Short Term Goals - 05/25/17 1035      PT SHORT TERM GOAL #1   Title  pt will be I with intial HEP    Baseline  no previous HEP    Time  3    Period  Weeks    Status  New  Target Date  06/15/17      PT SHORT TERM GOAL #2   Title  pt to verbalize/ demo proper posture in mutliple positions and lifting mechanics to prevent and reduce low back pain     Baseline  no knowledge of proper posture    Time  3    Period  Weeks    Status  New    Target Date  06/15/17        PT Long Term Goals - 05/25/17 1041      PT LONG TERM GOAL #1   Title  increase trunk flexion to >/= 80 degrees to promote functional trunk mobility required for ADLs     Baseline  60 degrees flexion with end range soreness.     Time  6    Period  Weeks    Status  New    Target Date  07/06/17      PT LONG TERM GOAL #2   Title  pt to be able to walk/ standing for >/= 60 min with </= 2/10 pain for functional endurnace required for community ambulation and ADLs    Baseline  walking/ standing 30 min with 6/10 pain    Time  6    Period  Weeks    Status  New    Target Date  07/06/17      PT LONG TERM GOAL #3   Title  pt to be I with all HEP given as of last visit to maintain and progress current level of function     Baseline  no previous HEP    Time  6    Period  Weeks    Status  New    Target Date  06/15/17            Plan - 06/13/17 1009    Clinical Impression Statement  pt arrived 16 min late today. focused on core strengthening combined  with decompression position. continued MET techniques to promote hip flexor activation. continued MHP end of session which he reported decreased pain.    PT Next Visit Plan   update HEP, core strength, trunk mobility, posterior innominate rotation on L. modalities PRN, continued hip strengthening/ core activation    PT Home Exercise Plan  lower trunk rotation, hamstring stretch, supine pelvic tilts, SLR    Consulted and Agree with Plan of Care  Patient       Patient will benefit from skilled therapeutic intervention in order to improve the following deficits and impairments:     Visit Diagnosis: Chronic bilateral low back pain, with sciatica presence unspecified  Abnormal posture  Muscle weakness (generalized)     Problem List Patient Active Problem List   Diagnosis Date Noted  . Chronic lumbar radiculopathy 02/10/2017  . Hyperlipidemia 03/14/2016  . Lumbar degenerative disc disease 09/11/2015  . Chronic pain syndrome 09/11/2015  . Pain in the chest 09/08/2015  . 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 Former heavy cigarette smoker (20-39 per day) 03/17/2014  . Chronic low back pain    Starr Lake PT, DPT, LAT, ATC  06/13/17  10:12 AM      [facility] [address] [place], [place], [address] Phone: [phone]   Fax:  [phone]  Name: Zackery Brine MRN: [id number] Date of Birth: 11-19-1955

## 2017-06-15 ENCOUNTER — Ambulatory Visit: Payer: Medicaid Other | Attending: Internal Medicine | Admitting: Internal Medicine

## 2017-06-15 ENCOUNTER — Encounter: Payer: Self-pay | Admitting: Internal Medicine

## 2017-06-15 VITALS — BP 116/77 | HR 72 | Temp 98.1°F | Resp 16 | Wt 145.4 lb

## 2017-06-15 DIAGNOSIS — M545 Low back pain: Secondary | ICD-10-CM

## 2017-06-15 DIAGNOSIS — K219 Gastro-esophageal reflux disease without esophagitis: Secondary | ICD-10-CM | POA: Insufficient documentation

## 2017-06-15 DIAGNOSIS — Z79899 Other long term (current) drug therapy: Secondary | ICD-10-CM | POA: Insufficient documentation

## 2017-06-15 DIAGNOSIS — L290 Pruritus ani: Secondary | ICD-10-CM | POA: Diagnosis not present

## 2017-06-15 DIAGNOSIS — G8929 Other chronic pain: Secondary | ICD-10-CM

## 2017-06-15 DIAGNOSIS — M5116 Intervertebral disc disorders with radiculopathy, lumbar region: Secondary | ICD-10-CM | POA: Diagnosis not present

## 2017-06-15 DIAGNOSIS — L299 Pruritus, unspecified: Secondary | ICD-10-CM | POA: Diagnosis not present

## 2017-06-15 DIAGNOSIS — Z87891 Personal history of nicotine dependence: Secondary | ICD-10-CM | POA: Diagnosis not present

## 2017-06-15 DIAGNOSIS — G894 Chronic pain syndrome: Secondary | ICD-10-CM | POA: Diagnosis not present

## 2017-06-15 DIAGNOSIS — E785 Hyperlipidemia, unspecified: Secondary | ICD-10-CM | POA: Diagnosis not present

## 2017-06-15 MED ORDER — CELECOXIB 200 MG PO CAPS: 200.0000 mg | ORAL_CAPSULE | Freq: Every day | ORAL | 2 refills | Status: DC

## 2017-06-15 MED ORDER — HYDROCORTISONE 2.5 % RE CREA: TOPICAL_CREAM | RECTAL | 0 refills | Status: DC

## 2017-06-15 MED ORDER — TRAMADOL HCL 50 MG PO TABS: 50.0000 mg | ORAL_TABLET | Freq: Three times a day (TID) | ORAL | 0 refills | Status: DC | PRN

## 2017-06-15 MED ORDER — TRAMADOL HCL 50 MG PO TABS: 50.0000 mg | ORAL_TABLET | Freq: Two times a day (BID) | ORAL | 0 refills | Status: DC | PRN

## 2017-06-15 MED FILL — traMADol HCL 50 MG TABS: 50 | 7 days supply | Qty: 21 | Fill #0

## 2017-06-15 MED FILL — CELECOXIB 200 MG CAPSULE: 200 | 30 days supply | Qty: 30 | Fill #0

## 2017-06-15 MED FILL — PROCTOZONE-HC 2.5 % CREA: 2.5 | 15 days supply | Qty: 30 | Fill #0

## 2017-06-15 NOTE — Progress Notes (Signed)
Patient ID: Nathaniel Robinson, male    DOB: 19-Oct-1955  MRN: [id number]  CC: Follow-up   Subjective: Awab Abebe is a 62 y.o. male who presents for chronic ds management.  He speaks [place]. Interpreter, Kibrom, from [employer], is with him.  His concerns today include:  Pt with hx of HL, chronic LBP, GERD  Chronic LBP:  Still seeing Dr. Letta Pate.  Recently had nerve block at L5.  Helped for several wks.  Pain back but not as severe.  Now doing P.T -out of Tramadol and Celebrex.  Reports he takes Tramadol BID.  Pain 6/10 before taking meds, 5/10 most of the day after taking.  No dizziness or constipation on Tramadol  HL:  Compliant with Lipitor.   Complains of rectal itching intermittently for the past 6 months.  Denies any blood in the stools.  Bowel movements are regular.  He has not noticed any worms in the stools. Patient Active Problem List   Diagnosis Date Noted  . Chronic lumbar radiculopathy 02/10/2017  . Hyperlipidemia 03/14/2016  . Lumbar degenerative disc disease 09/11/2015  . Chronic pain syndrome 09/11/2015  . Pain in the chest 09/08/2015  . 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 Former heavy cigarette smoker (20-39 per day) 03/17/2014  . Chronic low back pain      Current Outpatient Medications on File Prior to Visit  Medication Sig Dispense Refill  . atorvastatin (LIPITOR) 20 MG tablet Take 1 tablet (20 mg total) by mouth daily. 90 tablet prn  . omeprazole (PRILOSEC) 20 MG capsule Take 1 capsule (20 mg total) by mouth daily as needed. (Patient not taking: Reported on 05/25/2017) 30 capsule 3   No current facility-administered medications on file prior to visit.     No Known Allergies  Social History   Socioeconomic History  . Marital status: Married    Spouse name: Not on file  . Number of children: 4  . Years of education: Not on file  . Highest education level: Not on file  Occupational History  . Occupation:  Previous Optometrist     Comment: [place]   Social Needs  . Financial resource strain: Not on file  . Food insecurity:    Worry: Not on file    Inability: Not on file  . Transportation needs:    Medical: Not on file    Non-medical: Not on file  Tobacco Use  . Smoking status: Former Smoker    Packs/day: 0.75    Years: 25.00    Pack years: 18.75    Types: Cigarettes    Last attempt to quit: 09/19/2015    Years since quitting: 1.7  . Smokeless tobacco: Never Used  . Tobacco comment: 10 cigarettes a day   Substance and Sexual Activity  . Alcohol use: Yes    Alcohol/week: 0.0 oz    Comment: social   . Drug use: No  . Sexual activity: Never  Lifestyle  . Physical activity:    Days per week: Not on file    Minutes per session: Not on file  . Stress: Not on file  Relationships  . Social connections:    Talks on phone: Not on file    Gets together: Not on file    Attends religious service: Not on file    Active member of club or organization: Not on file    Attends meetings of clubs or organizations: Not on file    Relationship status: Not  on file  . Intimate partner violence:    Fear of current or ex partner: Not on file    Emotionally abused: Not on file    Physically abused: Not on file    Forced sexual activity: Not on file  Other Topics Concern  . Not on file  Social History Narrative   Form [place]   Moved to [place] in 01/2014    Lives in [place] alone   Wife and 4 children in [place].     Family History  Problem Relation Age of Onset  . Cancer Mother        "cancer where they put baby", from description sounds uterine   . Heart disease Neg Hx   . Hypertension Neg Hx   . Diabetes Neg Hx   . Colon cancer Neg Hx     Past Surgical History:  Procedure Laterality Date  . COLONOSCOPY N/A 05/15/2015   Procedure: COLONOSCOPY;  Surgeon: Danie Binder, MD;  [facility];  Service: Endoscopy;  Laterality: N/A;  145 - moved to 1:30 - office to notify  . None       ROS: Review of Systems Negative except as stated above PHYSICAL EXAM: BP 116/77   Pulse 72   Temp 98.1 F (36.7 C) (Oral)   Resp 16   Wt 145 lb 6.4 oz (66 kg)   SpO2 96%   BMI 23.47 kg/m   Wt Readings from Last 3 Encounters:  06/15/17 145 lb 6.4 oz (66 kg)  05/23/17 141 lb (64 kg)  02/02/17 144 lb 12.8 oz (65.7 kg)   Physical Exam  General appearance - alert, well appearing, and in no distress Mental status - alert, oriented to person, place, and time Chest - clear to auscultation, no wheezes, rales or rhonchi, symmetric air entry Heart - normal rate, regular rhythm, normal S1, S2, no murmurs, rubs, clicks or gallops Rectal -no external rectal lesions seen.  No rash. Extremities - peripheral pulses normal, no pedal edema, no clubbing or cyanosis  Results for orders placed or performed in visit on 02/02/17  Hepatic Function Panel  Result Value Ref Range   Total Protein 6.3 6.0 - 8.5 g/dL   Albumin 4.2 3.6 - 4.8 g/dL   Bilirubin Total 0.3 0.0 - 1.2 mg/dL   Bilirubin, Direct 0.09 0.00 - 0.40 mg/dL   Alkaline Phosphatase 57 39 - 117 IU/L   AST 23 0 - 40 IU/L   ALT 31 0 - 44 IU/L   ASSESSMENT AND PLAN: 1. Chronic left-sided low back pain without sciatica -Patient requested refill on Celebrex and tramadol.  Last prescription was refilled in January.  He reports some improvement in pain and ability to function during the day on these medications.  He is also under the care of a pain specialist and currently receiving physical therapy. -I went over new controlled substance agreement with patient reading at line for line since he does not speak [place].  Patient was told to ask questions or interrupt intermittently if he had questions.  Patient agreed to the document and signed it. NCCSRS reviewed and is appropriate - celecoxib (CELEBREX) 200 MG capsule; Take 1 capsule (200 mg total) by mouth daily.  Dispense: 90 capsule; Refill: 2 - traMADol (ULTRAM) 50 MG tablet; Take 1  tablet (50 mg total) by mouth every 12 (twelve) hours as needed.  Dispense: 60 tablet; Refill: 0 - traMADol (ULTRAM) 50 MG tablet; Take 1 tablet (50 mg total) by mouth every 8 (  eight) hours as needed.  Dispense: 30 tablet; Refill: 0 - traMADol (ULTRAM) 50 MG tablet; Take 1 tablet (50 mg total) by mouth every 8 (eight) hours as needed.  Dispense: 30 tablet; Refill: 0  2. Rectal itching Questionable etiology.  Possibility of parasites.  Stool is possibly low yield but we will still go ahead and order - hydrocortisone (ANUSOL-HC) 2.5 % rectal cream; Apply to affected area BID PRN  Dispense: 30 g; Refill: 0 - Ova and parasite examination  3. Hyperlipidemia, unspecified hyperlipidemia type Continue Lipitor.   Patient was given the opportunity to ask questions.  Patient verbalized understanding of the plan and was able to repeat key elements of the plan.   Orders Placed This Encounter  Procedures  . Ova and parasite examination     Requested Prescriptions   Signed Prescriptions Disp Refills  . celecoxib (CELEBREX) 200 MG capsule 90 capsule 2    Sig: Take 1 capsule (200 mg total) by mouth daily.  . traMADol (ULTRAM) 50 MG tablet 60 tablet 0    Sig: Take 1 tablet (50 mg total) by mouth every 12 (twelve) hours as needed.  . traMADol (ULTRAM) 50 MG tablet 30 tablet 0    Sig: Take 1 tablet (50 mg total) by mouth every 8 (eight) hours as needed.  . traMADol (ULTRAM) 50 MG tablet 30 tablet 0    Sig: Take 1 tablet (50 mg total) by mouth every 8 (eight) hours as needed.  . hydrocortisone (ANUSOL-HC) 2.5 % rectal cream 30 g 0    Sig: Apply to affected area BID PRN    Return in about 3 months (around 09/15/2017) for physical.  Karle Plumber, MD, Rosalita Chessman

## 2017-06-20 ENCOUNTER — Encounter: Payer: Self-pay | Admitting: Physical Therapy

## 2017-06-20 ENCOUNTER — Ambulatory Visit: Payer: Medicaid Other | Attending: Physical Medicine & Rehabilitation | Admitting: Physical Therapy

## 2017-06-20 DIAGNOSIS — R293 Abnormal posture: Secondary | ICD-10-CM

## 2017-06-20 DIAGNOSIS — M6281 Muscle weakness (generalized): Secondary | ICD-10-CM

## 2017-06-20 DIAGNOSIS — G8929 Other chronic pain: Secondary | ICD-10-CM | POA: Diagnosis present

## 2017-06-20 DIAGNOSIS — M545 Low back pain: Secondary | ICD-10-CM | POA: Insufficient documentation

## 2017-06-20 LAB — OVA AND PARASITE EXAMINATION

## 2017-06-20 NOTE — Therapy (Signed)
[place], [place], [address] Phone: [phone]   Fax:  [phone]  Physical Therapy Treatment  Patient Details  Name: Nathaniel Robinson MRN: [id number] Date of Birth: May 23, 1955 Referring Provider: Charlett Blake, MD   Encounter Date: 06/20/2017  PT End of Session - 06/20/17 0938    Visit Number  4    Number of Visits  13    Date for PT Re-Evaluation  07/06/17    Authorization Type  MCD (Re-assess/ re-submit at 7th visit if progressing)    PT Start Time  0933    PT Stop Time  1019    PT Time Calculation (min)  46 min    Activity Tolerance  Patient tolerated treatment well    Behavior During Therapy  [facility] for tasks assessed/performed       Past Medical History:  Diagnosis Date  . Chronic low back pain 2010     Past Surgical History:  Procedure Laterality Date  . COLONOSCOPY N/A 05/15/2015   Procedure: COLONOSCOPY;  Surgeon: Danie Binder, MD;  [facility];  Service: Endoscopy;  Laterality: N/A;  145 - moved to 1:30 - office to notify  . None      There were no vitals filed for this visit.  Subjective Assessment - 06/20/17 0935    Subjective  "I am still having about the same"     Currently in Pain?  Yes    Pain Score  5     Pain Location  Back    Pain Orientation  Mid    Pain Descriptors / Indicators  Aching;Sore    Pain Type  Chronic pain    Pain Onset  More than a month ago    Pain Frequency  Constant    Aggravating Factors   standing/ walking for long preiods of time, bending     Pain Relieving Factors  injection in the back         [facility] PT Assessment - 06/20/17 0939      AROM   Lumbar Flexion  40    Lumbar Extension  18    Lumbar - Right Side Bend  10    Lumbar - Left Side Bend  10      Strength   Right Hip Flexion  4+/5    Right Hip Extension  4/5    Right Hip ABduction  4-/5    Left Hip Flexion  4+/5    Left Hip Extension  4/5    Left Hip ABduction  4-/5     Left Hip ADduction  4-/5                   [facility] Adult PT Treatment/Exercise - 06/20/17 1004      Self-Care   Self-Care  Other Self-Care Comments    Other Self-Care Comments   benefits of heel lift in the R shoe due to leg length difference with the LLE being longer compared bil.       Lumbar Exercises: Stretches   Active Hamstring Stretch  4 reps;30 seconds PNF contract/ relax with 10 sec contraction      Knee/Hip Exercises: Supine   Straight Leg Raises  2 sets;20 reps;Strengthening;Left      Moist Heat Therapy   Number Minutes Moist Heat  10 Minutes    Moist Heat Location  Lumbar Spine      Manual Therapy   Manual Therapy  Joint mobilization  Joint Mobilization  L innominate anterior mob grade 3, LAD grade 5 for LLE only    Muscle Energy Technique  resisted L hip flexion 10 x 5 sec hold             PT Education - 06/20/17 1009    Education provided  Yes    Education Details  effects of LLD with the LLE being longer and utilized more causing more pain and tightness. benefits of the heel lift to promote pelvic height equality and promote equal use of bil LE. updateed POC.     Person(s) Educated  Patient    Methods  Explanation;Verbal cues;Demonstration    Comprehension  Verbalized understanding;Verbal cues required       PT Short Term Goals - 06/20/17 1010      PT SHORT TERM GOAL #1   Title  pt will be I with intial HEP    Time  3    Period  Weeks    Status  Achieved      PT SHORT TERM GOAL #2   Title  pt to verbalize/ demo proper posture in mutliple positions and lifting mechanics to prevent and reduce low back pain     Time  3    Period  Weeks    Status  Achieved        PT Long Term Goals - 06/20/17 1010      PT LONG TERM GOAL #1   Title  increase trunk flexion to >/= 80 degrees to promote functional trunk mobility required for ADLs     Baseline  40 degrees flexion with end range soreness.     Time  6    Period  Weeks    Status   On-going    Target Date  07/06/17      PT LONG TERM GOAL #2   Title  pt to be able to walk/ standing for >/= 60 min with </= 2/10 pain for functional endurnace required for community ambulation and ADLs    Baseline  walking/ standing 30 min with 6/10 pain    Time  6    Period  Weeks    Status  On-going    Target Date  07/06/17      PT LONG TERM GOAL #3   Title  pt to be I with all HEP given as of last visit to maintain and progress current level of function     Baseline  independent with currently provided HEP progressing as tolerated    Time  6    Period  Weeks    Status  New    Target Date  07/06/17            Plan - 06/20/17 1012    Clinical Impression Statement  Mr. Joubert continued to reported 6/10 pain inthe low back and exhibits limited trunk mobility compared to initial measures. he has met all STG's and is working toward long term goals exhibit motivation to improve and continue with treatment. pt demonstrates LLD with RLE bing shorter and was provided a heel lift. continued working on L hip innominate rotation which is posteriorly rotated to reduce LLE causeing pain at the SIJ. plan to continued with current POC utilizing heel lift to promote pelvic symmatry, continued soft tissue work and strengthening to promote stability. discussed with pt if no progress is made in the next 3 visits plan to refer pt back to his provider for further assessment    PT Frequency  1x / week    PT Duration  3 weeks    PT Treatment/Interventions  ADLs/Self Care Home Management;Cryotherapy;Electrical Stimulation;Iontophoresis 46m/ml Dexamethasone;Moist Heat;Traction;Ultrasound;Neuromuscular re-education;Patient/family education;Therapeutic activities;Therapeutic exercise;Functional mobility training;Manual techniques;Taping;Dry needling;Passive range of motion    PT Next Visit Plan   update HEP, core strength, trunk mobility, posterior innominate rotation on L. modalities PRN, continued hip  strengthening/ core activation    PT Home Exercise Plan  lower trunk rotation, hamstring stretch, supine pelvic tilts, SLR, heel lift provided in R shoe.       Patient will benefit from skilled therapeutic intervention in order to improve the following deficits and impairments:  Pain, Improper body mechanics, Postural dysfunction, Decreased strength, Decreased activity tolerance, Decreased endurance, Decreased knowledge of use of DME  Visit Diagnosis: Chronic bilateral low back pain, with sciatica presence unspecified  Abnormal posture  Muscle weakness (generalized)     Problem List Patient Active Problem List   Diagnosis Date Noted  . Chronic lumbar radiculopathy 02/10/2017  . Hyperlipidemia 03/14/2016  . Lumbar degenerative disc disease 09/11/2015  . Chronic pain syndrome 09/11/2015  . Pain in the chest 09/08/2015  . 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 Former heavy cigarette smoker (20-39 per day) 03/17/2014  . Chronic low back pain    KStarr LakePT, DPT, LAT, ATC  06/20/17  10:16 AM      [facility] [address]Phone: [phone]  Fax:  [phone] Name: YNewell WaferMRN: [id number] of Birth: 1Jun 30, 1957

## 2017-06-26 MED FILL — traMADol HCL 50 MG TABS: 50 | 3 days supply | Qty: 9 | Fill #1

## 2017-07-04 ENCOUNTER — Ambulatory Visit: Payer: Medicaid Other | Admitting: Physical Medicine & Rehabilitation

## 2017-07-04 ENCOUNTER — Encounter: Payer: Self-pay | Admitting: Physical Medicine & Rehabilitation

## 2017-07-04 ENCOUNTER — Encounter: Payer: Medicaid Other | Attending: Physical Medicine & Rehabilitation

## 2017-07-04 ENCOUNTER — Other Ambulatory Visit: Payer: Self-pay

## 2017-07-04 VITALS — BP 127/70 | HR 81 | Ht 66.25 in | Wt 143.2 lb

## 2017-07-04 DIAGNOSIS — M5417 Radiculopathy, lumbosacral region: Secondary | ICD-10-CM | POA: Insufficient documentation

## 2017-07-04 DIAGNOSIS — M47816 Spondylosis without myelopathy or radiculopathy, lumbar region: Secondary | ICD-10-CM

## 2017-07-04 MED ORDER — DICLOFENAC SODIUM 1 % TD GEL: 2.0000 g | Freq: Four times a day (QID) | TRANSDERMAL | 1 refills | Status: DC

## 2017-07-04 MED FILL — ATORVASTATIN 20 MG TABLET: 20 | 30 days supply | Qty: 30 | Fill #5

## 2017-07-04 NOTE — Progress Notes (Signed)
Subjective:    Patient ID: Nathaniel Robinson, male    DOB: 12-02-1955, 62 y.o.   MRN: [id number]  HPI  62 year old [place] man with history of low back pain x5 years.  He has had sacroiliac injections without much relief.  Initially medial branch blocks were not helpful but has undergone medial branch blocks both on the right side and left side at L3-L4-L5 and these are now helpful for relieving his axial back pain Left Lumbar L3, L4  medial branch blocks and L 5 dorsal ramus injection under fluoroscopic guidance - left side low back pain improved  The patient has also had relief with left L4 and left L5 transforaminal epidural injections last performed 02/10/2017  The patient states that he continues to have left greater than right-sided low back pain but his pain is not severe.  He is going to physical therapy but feels like his pain gets aggravated after therapy, he still has 3 more visits scheduled, they are scheduled weekly.  Pain Inventory Average Pain 5 Pain Right Now 5 My pain is burning  In the last 24 hours, has pain interfered with the following? General activity 5 Relation with others 5 Enjoyment of life 5 What TIME of day is your pain at its worst? morning daytime and evening Sleep (in general) Fair  Pain is worse with: walking, bending, inactivity, standing and some activites Pain improves with: rest, medication and injections Relief from Meds: 5  Mobility walk without assistance how many minutes can you walk? 30 ability to climb steps?  yes do you drive?  no  Function disabled: date disabled 07/2014  Neuro/Psych No problems in this area  Prior Studies Any changes since last visit?  no  Physicians involved in your care Any changes since last visit?  no   Family History  Problem Relation Age of Onset  . Cancer Mother        "cancer where they put baby", from description sounds uterine   . Heart disease Neg Hx   . Hypertension Neg Hx   . Diabetes Neg Hx    . Colon cancer Neg Hx    Social History   Socioeconomic History  . Marital status: Married    Spouse name: Not on file  . Number of children: 4  . Years of education: Not on file  . Highest education level: Not on file  Occupational History  . Occupation: Previous Optometrist     Comment: [place]   Social Needs  . Financial resource strain: Not on file  . Food insecurity:    Worry: Not on file    Inability: Not on file  . Transportation needs:    Medical: Not on file    Non-medical: Not on file  Tobacco Use  . Smoking status: Former Smoker    Packs/day: 0.75    Years: 25.00    Pack years: 18.75    Types: Cigarettes    Last attempt to quit: 09/19/2015    Years since quitting: 1.7  . Smokeless tobacco: Never Used  . Tobacco comment: 10 cigarettes a day   Substance and Sexual Activity  . Alcohol use: Yes    Alcohol/week: 0.0 oz    Comment: social   . Drug use: No  . Sexual activity: Never  Lifestyle  . Physical activity:    Days per week: Not on file    Minutes per session: Not on file  . Stress: Not on file  Relationships  . Social connections:  Talks on phone: Not on file    Gets together: Not on file    Attends religious service: Not on file    Active member of club or organization: Not on file    Attends meetings of clubs or organizations: Not on file    Relationship status: Not on file  Other Topics Concern  . Not on file  Social History Narrative   Form [place]   Moved to [place] in 01/2014    Lives in [place] alone   Wife and 4 children in [place].    Past Surgical History:  Procedure Laterality Date  . COLONOSCOPY N/A 05/15/2015   Procedure: COLONOSCOPY;  Surgeon: Danie Binder, MD;  [facility];  Service: Endoscopy;  Laterality: N/A;  145 - moved to 1:30 - office to notify  . None     Past Medical History:  Diagnosis Date  . Chronic low back pain 2010    BP 127/70   Pulse 81   Ht 5' 6.25" (1.683 m)   Wt 143 lb 3.2 oz (65 kg)   SpO2  96%   BMI 22.94 kg/m   Opioid Risk Score:   Fall Risk Score:  `1  Depression screen PHQ 2/9  Depression screen [employer] 2/9 07/04/2017 06/15/2017 04/24/2017 02/02/2017 12/05/2016 10/31/2016 08/15/2016  Decreased Interest 0 0 0 0 0 0 0  Down, Depressed, Hopeless 0 0 0 0 0 0 0  PHQ - 2 Score 0 0 0 0 0 0 0  Altered sleeping - - - - - - 0  Tired, decreased energy - - - - - - 0  Change in appetite - - - - - - 0  Feeling bad or failure about yourself  - - - - - - 0  Trouble concentrating - - - - - - 0  Moving slowly or fidgety/restless - - - - - - 0  Suicidal thoughts - - - - - - 0  PHQ-9 Score - - - - - - 0    Review of Systems  Constitutional: Negative.   HENT: Negative.   Eyes: Negative.   Respiratory: Negative.   Cardiovascular: Negative.   Gastrointestinal: Negative.   Endocrine: Negative.   Genitourinary: Negative.   Musculoskeletal: Negative.   Skin: Negative.   Allergic/Immunologic: Negative.   Neurological: Negative.   Hematological: Negative.   Psychiatric/Behavioral: Negative.   All other systems reviewed and are negative.      Objective:   Physical Exam  Constitutional: He is oriented to person, place, and time. He appears well-developed and well-nourished. No distress.  HENT:  Head: Normocephalic and atraumatic.  Eyes: Pupils are equal, round, and reactive to light. EOM are normal.  Neurological: He is alert and oriented to person, place, and time. No cranial nerve deficit. Coordination normal.  Skin: He is not diaphoretic.  Nursing note and vitals reviewed. Mild tenderness palpation along the left lumbar paraspinal area Negative straight leg raising Gait is normal Lower extremity strength is normal Lumbar range of motion is approximately 50% flexion extension lateral tension and bending        Assessment & Plan:  1.  Lumbar spondylosis without myelopathy he has prolonged effect from lumbar medial branch blocks both on the right and left side L3-L4 medial  branch and L5 dorsal ramus to block the L4-5 and L5-S1 facet joint complexes  He has no radicular pain in his current time. We discussed through the interpreter that if he feels like his back pain  does return to a more severe level he may call to schedule another injection.

## 2017-07-04 NOTE — Patient Instructions (Signed)
ICY HOT or BEN GAY cream

## 2017-07-05 ENCOUNTER — Ambulatory Visit: Payer: Medicaid Other | Admitting: Physical Therapy

## 2017-07-05 ENCOUNTER — Encounter: Payer: Self-pay | Admitting: Physical Therapy

## 2017-07-05 DIAGNOSIS — M545 Low back pain: Secondary | ICD-10-CM | POA: Diagnosis not present

## 2017-07-05 DIAGNOSIS — M6281 Muscle weakness (generalized): Secondary | ICD-10-CM

## 2017-07-05 DIAGNOSIS — G8929 Other chronic pain: Secondary | ICD-10-CM

## 2017-07-05 DIAGNOSIS — R293 Abnormal posture: Secondary | ICD-10-CM

## 2017-07-05 NOTE — Therapy (Signed)
[place], [place], [address] Phone: [phone]   Fax:  [phone]  Physical Therapy Treatment  Patient Details  Name: Nathaniel Robinson MRN: [id number] Date of Birth: October 19, 1955 Referring Provider: Charlett Blake, MD   Encounter Date: 07/05/2017  PT End of Session - 07/05/17 1000    Visit Number  5    Number of Visits  13    Date for PT Re-Evaluation  07/06/17    Authorization Type  MCD (Re-assess/ re-submit at 7th visit if progressing)    PT Start Time  0933    PT Stop Time  1022    PT Time Calculation (min)  49 min    Activity Tolerance  Patient tolerated treatment well    Behavior During Therapy  [facility] for tasks assessed/performed       Past Medical History:  Diagnosis Date  . Chronic low back pain 2010     Past Surgical History:  Procedure Laterality Date  . COLONOSCOPY N/A 05/15/2015   Procedure: COLONOSCOPY;  Surgeon: Danie Binder, MD;  [facility];  Service: Endoscopy;  Laterality: N/A;  145 - moved to 1:30 - office to notify  . None      There were no vitals filed for this visit.  Subjective Assessment - 07/05/17 0935    Subjective  "Still having pain in the back, and having more soreness in the morning and stiff for the last 5 days"     Currently in Pain?  Yes    Pain Score  5     Pain Orientation  Mid    Pain Descriptors / Indicators  Aching;Sore    Aggravating Factors   bending forward, getting up in the morning,     Pain Relieving Factors  muscle rub.                        [place] Adult PT Treatment/Exercise - 07/05/17 1001      Knee/Hip Exercises: Aerobic   Nustep  L5 x 5 min bil UE/LE      Knee/Hip Exercises: Supine   Bridges with Ball Squeeze  2 sets;10 reps    Other Supine Knee/Hip Exercises  clams 2 x 15 with green theraband      Moist Heat Therapy   Number Minutes Moist Heat  10 Minutes    Moist Heat Location  Lumbar Spine supine      Manual Therapy   Manual Therapy  Soft tissue mobilization    Manual therapy comments  skilled palpation and monitoring of pt throughout TPDN    Soft tissue mobilization  IASTM along L glute med/ min       Trigger Point Dry Needling - 07/05/17 1001    Consent Given?  Yes    Education Handout Provided  Yes    Muscles Treated Lower Body  Gluteus minimus    Gluteus Minimus Response  Twitch response elicited;Palpable increased muscle length glute med on L            PT Education - 07/05/17 0958    Education provided  Yes    Education Details  muscle anatomy and referral patterns. What TPDN is, benefits and what to expect as well as aftercare. Discussed if no progress is noted then plan to refer back to his MD and discharge from PT.  increased time needed for interpretation    Person(s) Educated  Patient    Methods  Explanation;Verbal  cues    Comprehension  Verbalized understanding;Verbal cues required       PT Short Term Goals - 06/20/17 1010      PT SHORT TERM GOAL #1   Title  pt will be I with intial HEP    Time  3    Period  Weeks    Status  Achieved      PT SHORT TERM GOAL #2   Title  pt to verbalize/ demo proper posture in mutliple positions and lifting mechanics to prevent and reduce low back pain     Time  3    Period  Weeks    Status  Achieved        PT Long Term Goals - 06/20/17 1010      PT LONG TERM GOAL #1   Title  increase trunk flexion to >/= 80 degrees to promote functional trunk mobility required for ADLs     Baseline  40 degrees flexion with end range soreness.     Time  6    Period  Weeks    Status  On-going    Target Date  07/06/17      PT LONG TERM GOAL #2   Title  pt to be able to walk/ standing for >/= 60 min with </= 2/10 pain for functional endurnace required for community ambulation and ADLs    Baseline  walking/ standing 30 min with 6/10 pain    Time  6    Period  Weeks    Status  On-going    Target Date  07/06/17      PT LONG TERM  GOAL #3   Title  pt to be I with all HEP given as of last visit to maintain and progress current level of function     Baseline  independent with currently provided HEP progressing as tolerated    Time  6    Period  Weeks    Status  New    Target Date  07/06/17            Plan - 07/05/17 1011    Clinical Impression Statement  pt continues to report tightness and pain rated at 5/10 today. Educated and performed TPDN along the glute med/ min followed with IASTM techniques. continued strengthening which he was able to perform well but continued to point at the left low back reporting pain and tightness. end of session utilized MHP . discussed if no improvement is noticed in pain/ stiffness/ mobility then plan to refer back to MD and discharge next appointment.     PT Next Visit Plan   update HEP, core strength, trunk mobility, posterior innominate rotation on L. modalities PRN, continued hip strengthening/ core activation. cont vs D/C    PT Home Exercise Plan  lower trunk rotation, hamstring stretch, supine pelvic tilts, SLR, heel lift provided in R shoe.    Consulted and Agree with Plan of Care  Patient       Patient will benefit from skilled therapeutic intervention in order to improve the following deficits and impairments:  Pain, Improper body mechanics, Postural dysfunction, Decreased strength, Decreased activity tolerance, Decreased endurance, Decreased knowledge of use of DME  Visit Diagnosis: Chronic bilateral low back pain, with sciatica presence unspecified  Abnormal posture  Muscle weakness (generalized)     Problem List Patient Active Problem List   Diagnosis Date Noted  . Chronic lumbar radiculopathy 02/10/2017  . Hyperlipidemia 03/14/2016  . Lumbar degenerative disc disease 09/11/2015  .  Chronic pain syndrome 09/11/2015  . Pain in the chest 09/08/2015  . 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 Former heavy cigarette smoker (20-39  per day) 03/17/2014  . Chronic low back pain    Starr Lake PT, DPT, LAT, ATC  07/05/17  10:15 AM      [facility] [address] [place], [place], [address] Phone: [phone]   Fax:  [phone]  Name: Nathaniel Robinson MRN: [id number] Date of Birth: 15-Aug-1955

## 2017-07-12 ENCOUNTER — Encounter: Payer: Self-pay | Admitting: Physical Therapy

## 2017-07-12 ENCOUNTER — Ambulatory Visit: Payer: Medicaid Other | Admitting: Physical Therapy

## 2017-07-12 DIAGNOSIS — M545 Low back pain: Principal | ICD-10-CM

## 2017-07-12 DIAGNOSIS — R293 Abnormal posture: Secondary | ICD-10-CM

## 2017-07-12 DIAGNOSIS — G8929 Other chronic pain: Secondary | ICD-10-CM

## 2017-07-12 DIAGNOSIS — M6281 Muscle weakness (generalized): Secondary | ICD-10-CM

## 2017-07-12 NOTE — Therapy (Signed)
[place], [place], [address] Phone: [phone]   Fax:  [phone]  Physical Therapy Treatment / Discharge Summary  Patient Details  Name: Nathaniel Robinson MRN: [id number] Date of Birth: 1955-12-20 Referring Provider: Charlett Blake, MD   Encounter Date: 07/12/2017  PT End of Session - 07/12/17 1137    Visit Number  6    Number of Visits  13    Date for PT Re-Evaluation  07/06/17    Authorization Type  MCD (Re-assess/ re-submit at 7th visit if progressing)    PT Start Time  1103    PT Stop Time  1132 shortened session due to D/C    PT Time Calculation (min)  29 min    Activity Tolerance  Patient tolerated treatment well    Behavior During Therapy  [facility] for tasks assessed/performed       Past Medical History:  Diagnosis Date  . Chronic low back pain 2010     Past Surgical History:  Procedure Laterality Date  . COLONOSCOPY N/A 05/15/2015   Procedure: COLONOSCOPY;  Surgeon: Danie Binder, MD;  [facility];  Service: Endoscopy;  Laterality: N/A;  145 - moved to 1:30 - office to notify  . None      There were no vitals filed for this visit.  Subjective Assessment - 07/12/17 1106    Subjective  "I am still having about the same, not really having any changes"     Currently in Pain?  Yes    Pain Score  5     Pain Location  Back    Pain Orientation  Mid    Pain Descriptors / Indicators  Aching;Sore    Pain Type  Chronic pain    Pain Onset  More than a month ago    Pain Frequency  Constant    Aggravating Factors   bending forward, getting up in the morning    Pain Relieving Factors  muscle rub         [facility] PT Assessment - 07/12/17 1109      AROM   Lumbar Flexion  50 ERP    Lumbar Extension  20 ERP    Lumbar - Right Side Bend  10    Lumbar - Left Side Bend  10                   [facility] Adult PT Treatment/Exercise - 07/12/17 1118      Knee/Hip Exercises: Aerobic    Nustep  L5 x 5 min bil UE/LE      Knee/Hip Exercises: Standing   Hip Abduction  1 set;10 reps;Knee straight             PT Education - 07/12/17 1116    Education provided  Yes    Education Details  updated HEP and reviewed previously provided HEP, benefits of continued exercise.    Person(s) Educated  Patient    Methods  Explanation;Verbal cues;Handout    Comprehension  Verbalized understanding;Verbal cues required       PT Short Term Goals - 06/20/17 1010      PT SHORT TERM GOAL #1   Title  pt will be I with intial HEP    Time  3    Period  Weeks    Status  Achieved      PT SHORT TERM GOAL #2   Title  pt to verbalize/ demo proper posture in mutliple positions and lifting  mechanics to prevent and reduce low back pain     Time  3    Period  Weeks    Status  Achieved        PT Long Term Goals - 07/12/17 1136      PT LONG TERM GOAL #1   Title  increase trunk flexion to >/= 80 degrees to promote functional trunk mobility required for ADLs     Time  6    Period  Weeks    Status  Not Met      PT LONG TERM GOAL #2   Title  pt to be able to walk/ standing for >/= 60 min with </= 2/10 pain for functional endurnace required for community ambulation and ADLs    Time  6    Period  Weeks    Status  Not Met      PT LONG TERM GOAL #3   Title  pt to be I with all HEP given as of last visit to maintain and progress current level of function     Time  6    Period  Weeks    Status  Achieved            Plan - 07/12/17 1138    Clinical Impression Statement  pt continues to rpeort 6/10 pain and continued limited trunk mobility in all planes. He met only LTG #3 and did not met LTG's #1 and 2. Based pt function and lack of progress in 6 visits and continued pain plan to refer back to his MD and discharge from PT.     PT Next Visit Plan  D/C    PT Home Exercise Plan  lower trunk rotation, hamstring stretch, supine pelvic tilts, SLR, heel lift provided in R shoe.     Consulted and Agree with Plan of Care  Patient       Patient will benefit from skilled therapeutic intervention in order to improve the following deficits and impairments:     Visit Diagnosis: Chronic bilateral low back pain, with sciatica presence unspecified  Abnormal posture  Muscle weakness (generalized)     Problem List Patient Active Problem List   Diagnosis Date Noted  . Chronic lumbar radiculopathy 02/10/2017  . Hyperlipidemia 03/14/2016  . Lumbar degenerative disc disease 09/11/2015  . Chronic pain syndrome 09/11/2015  . Pain in the chest 09/08/2015  . 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 Former heavy cigarette smoker (20-39 per day) 03/17/2014  . Chronic low back pain    Dominico Rod PT, DPT, LAT, ATC  07/12/17  11:41 AM      [facility] [address] [place], [place], [address] Phone: [phone]   Fax:  [phone]  Name: Nathaniel Robinson MRN: [id number] Date of Birth: [birth year]               PHYSICAL THERAPY DISCHARGE SUMMARY  Visits from Start of Care: 6  Current functional level related to goals / functional outcomes: See goals   Remaining deficits: 6/10 L low back pain with limited trunk mobility with flexion end range pain. Limited strength secondary to pain and limited endurance.    Education / Equipment: HEP, theraband, posture,   Plan: Patient agrees to discharge.  Patient goals were not met. Patient is being discharged due to lack of progress.  ?????          Niv Darley PT, DPT, LAT, ATC  07/12/17  11:42  AM

## 2017-07-17 MED FILL — CELECOXIB 200 MG CAPSULE: 200 | 30 days supply | Qty: 30 | Fill #1

## 2017-07-17 MED FILL — traMADol HCL 50 MG TABS: 50 | 7 days supply | Qty: 14 | Fill #0

## 2017-07-19 ENCOUNTER — Encounter: Payer: Medicaid Other | Admitting: Physical Therapy

## 2017-07-27 MED FILL — traMADol HCL 50 MG TABS: 50 | 7 days supply | Qty: 14 | Fill #1

## 2017-08-04 MED FILL — ATORVASTATIN 20 MG TABLET: 20 | 30 days supply | Qty: 30 | Fill #6

## 2017-08-14 MED FILL — traMADol HCL 50 MG TABS: 50 | 7 days supply | Qty: 14 | Fill #2

## 2017-08-22 ENCOUNTER — Encounter: Payer: Medicaid Other | Attending: Physical Medicine & Rehabilitation

## 2017-08-22 ENCOUNTER — Encounter: Payer: Self-pay | Admitting: Physical Medicine & Rehabilitation

## 2017-08-22 ENCOUNTER — Ambulatory Visit: Payer: Medicaid Other | Admitting: Physical Medicine & Rehabilitation

## 2017-08-22 VITALS — BP 117/78 | HR 73 | Ht 65.0 in | Wt 143.0 lb

## 2017-08-22 DIAGNOSIS — M5417 Radiculopathy, lumbosacral region: Secondary | ICD-10-CM | POA: Insufficient documentation

## 2017-08-22 DIAGNOSIS — M47816 Spondylosis without myelopathy or radiculopathy, lumbar region: Secondary | ICD-10-CM

## 2017-08-22 NOTE — Progress Notes (Signed)
Left Lumbar L3, L4  medial branch blocks and L 5 dorsal ramus injection under fluoroscopic guidance   Indication: Left Lumbar pain which is not relieved by medication management or other conservative care and interfering with self-care and mobility.  Informed consent was obtained after describing risks and benefits of the procedure with the patient, this includes bleeding, bruising, infection, paralysis and medication side effects.  The patient wishes to proceed and has given written consent.  The patient was placed in a prone position.  The lumbar area was marked and prepped with Betadine.  One mL of 1% lidocaine was injected into each of 3 areas into the skin and subcutaneous tissue.  Then a 22-gauge 3.5in spinal needle was inserted targeting the junction of the left S1 superior articular process and sacral ala junction.  Needle was advanced under fluoroscopic guidance.  Bone contact was made. Isovue 200 was injected x 0.5 mL demonstrating no intravascular uptake.  Then a solution containing  2% MPF lidocaine was injected x 0.5 mL.  Then the left L5 superior articular process in transverse process junction was targeted.  Bone contact was made. Isovue 200 was injected x 0.5 mL demonstrating no intravascular uptake.  Then a solution containing 2% MPF lidocaine was injected x 0.5 mL.  Then the left L4 superior articular process in transverse process junction was targeted.  Bone contact was made.  Isovue 200 was injected x 0.5 mL demonstrating no intravascular uptake.  Then a solution containing  2% MPF lidocaine was injected x 0.5 mL.  Patient tolerated procedure well.  Post procedure instructions were given. 

## 2017-08-22 NOTE — Patient Instructions (Signed)

## 2017-08-22 NOTE — Progress Notes (Signed)
  PROCEDURE RECORD [facility]   Name: Nathaniel Robinson DOB:1955-09-03 MRN: [id number]  Date:08/22/2017  Physician: Alysia Penna, MD    Nurse/CMA: Hayden Mabin CMA  Allergies: No Known Allergies  Consent Signed: Yes.    Is patient diabetic? No.  CBG today? NA  Pregnant: No. LMP: No LMP for male patient. (age 61-55)  Anticoagulants: no Anti-inflammatory: no Antibiotics: no  Procedure: Left L3-5 MBB  Position: Prone   Start Time: 1:51pm End Time: 1:57pm Fluoro Time: 26s  RN/CMA [place] Weekly CMA Lillybeth Tal CMA    Time 1:35pm 2:02pm    BP 117/78 123/79    Pulse 73 79    Respirations 16 16    O2 Sat 97 98    S/S 6 6    Pain Level 7/10 5/10     D/C home with patient transportation, patient A & O X 3, D/C instructions reviewed, and sits independently.

## 2017-08-23 MED FILL — OMEPRAZOLE 20 MG CAP: 20 | 30 days supply | Qty: 30 | Fill #2

## 2017-08-27 ENCOUNTER — Other Ambulatory Visit: Payer: Self-pay

## 2017-08-27 ENCOUNTER — Encounter (HOSPITAL_COMMUNITY): Payer: Self-pay

## 2017-08-27 ENCOUNTER — Emergency Department (HOSPITAL_COMMUNITY)
Admission: EM | Admit: 2017-08-27 | Discharge: 2017-08-27 | Disposition: A | Discharge status: Home / Self Care | Payer: Medicaid Other | Attending: Emergency Medicine | Admitting: Emergency Medicine

## 2017-08-27 DIAGNOSIS — Z79899 Other long term (current) drug therapy: Secondary | ICD-10-CM | POA: Insufficient documentation

## 2017-08-27 DIAGNOSIS — K2960 Other gastritis without bleeding: Secondary | ICD-10-CM | POA: Insufficient documentation

## 2017-08-27 DIAGNOSIS — R1013 Epigastric pain: Secondary | ICD-10-CM | POA: Diagnosis present

## 2017-08-27 DIAGNOSIS — K219 Gastro-esophageal reflux disease without esophagitis: Secondary | ICD-10-CM | POA: Diagnosis not present

## 2017-08-27 DIAGNOSIS — Z87891 Personal history of nicotine dependence: Secondary | ICD-10-CM | POA: Insufficient documentation

## 2017-08-27 LAB — LIPASE, BLOOD: LIPASE: 42 U/L (ref 11–51)

## 2017-08-27 LAB — COMPREHENSIVE METABOLIC PANEL
ALBUMIN: 3.7 g/dL (ref 3.5–5.0)
ALT: 30 U/L (ref 0–44)
ANION GAP: 7 (ref 5–15)
AST: 23 U/L (ref 15–41)
Alkaline Phosphatase: 48 U/L (ref 38–126)
BILIRUBIN TOTAL: 0.7 mg/dL (ref 0.3–1.2)
BUN: 8 mg/dL (ref 6–20)
CHLORIDE: 106 mmol/L (ref 98–111)
CO2: 26 mmol/L (ref 22–32)
Calcium: 9.1 mg/dL (ref 8.9–10.3)
Creatinine, Ser: 0.65 mg/dL (ref 0.61–1.24)
GFR calc Af Amer: >60 mL/min (ref >60)
GFR calc non Af Amer: >60 mL/min (ref >60)
GLUCOSE: 116 mg/dL — AB (ref 70–99)
Potassium: 3.8 mmol/L (ref 3.5–5.1)
SODIUM: 139 mmol/L (ref 135–145)
TOTAL PROTEIN: 6.4 g/dL — AB (ref 6.5–8.1)

## 2017-08-27 LAB — CBC
HCT: 46.1 % (ref 39.0–52.0)
HEMOGLOBIN: 15.2 g/dL (ref 13.0–17.0)
MCH: 31.7 pg (ref 26.0–34.0)
MCHC: 33 g/dL (ref 30.0–36.0)
MCV: 96 fL (ref 78.0–100.0)
Platelets: 240 10*3/uL (ref 150–400)
RBC: 4.8 MIL/uL (ref 4.22–5.81)
RDW: 12.8 % (ref 11.5–15.5)
WBC: 6.1 10*3/uL (ref 4.0–10.5)

## 2017-08-27 MED ORDER — SUCRALFATE 1 G PO TABS: 1.0000 g | ORAL_TABLET | Freq: Three times a day (TID) | ORAL | 0 refills | Status: DC

## 2017-08-27 MED ORDER — DICYCLOMINE HCL 20 MG PO TABS: 20.0000 mg | ORAL_TABLET | Freq: Two times a day (BID) | ORAL | 0 refills | Status: DC

## 2017-08-27 MED ORDER — SUCRALFATE 1 G PO TABS
1.0000 g | ORAL_TABLET | Freq: Once | ORAL | Status: AC
  Administered 2017-08-27: 1 g via ORAL
  Filled 2017-08-27: qty 1

## 2017-08-27 MED ORDER — FAMOTIDINE 20 MG PO TABS: 20.0000 mg | ORAL_TABLET | Freq: Two times a day (BID) | ORAL | 0 refills | Status: DC

## 2017-08-27 MED ORDER — GI COCKTAIL ~~LOC~~
30.0000 mL | Freq: Once | ORAL | Status: AC
  Administered 2017-08-27: 30 mL via ORAL
  Filled 2017-08-27: qty 30

## 2017-08-27 MED ORDER — FAMOTIDINE IN NACL 20-0.9 MG/50ML-% IV SOLN
20.0000 mg | Freq: Once | INTRAVENOUS | Status: AC
  Administered 2017-08-27: 20 mg via INTRAVENOUS
  Filled 2017-08-27: qty 50

## 2017-08-27 NOTE — ED Triage Notes (Addendum)
Pt states that he is having a burning sensation in his back, legs, and has a headache. Pt reports symptoms have been going on for 15 days. Denies fever, denies NV, denies CP, denies SOB. Pt sates with interpreter "I suspect I have an ulcer".

## 2017-08-27 NOTE — Discharge Instructions (Signed)
Contact a health care provider if: Your symptoms get worse. Your symptoms return after treatment. Get help right away if: You vomit blood or material that looks like coffee grounds. You have black or dark red stools. You are unable to keep fluids down. Your abdominal pain gets worse. You have a fever.

## 2017-08-27 NOTE — ED Provider Notes (Signed)
[facility] EMERGENCY DEPARTMENT Provider Note   CSN: [id number] Arrival date & time: 08/27/17  4174     History   Chief Complaint Chief Complaint  Patient presents with  . Back Pain  . Abdominal Pain    HPI Nathaniel Robinson is a 62 y.o. male who presents for epigastric abdominal pain.  The patient is [place].  He is able to speak [place] but asks for translation with [place] interpreter.  Patient states that he has had a long-standing issue with reflux.  He states that for the past 2 weeks it has gotten intense and severe.  Worsened by spicy foods, worsen when lying flat and at night.  He is on omeprazole and has had no relief of his symptoms.  He is a daily smoker but denies any alcohol use.  The patient also states that he is having burning pain in his hands and feet and between his shoulder blades.  He denies a history of diabetes.  He is concerned that he may have a peptic ulcer and he may need an endoscopy.  Denies chest pain, shortness of breath, vomiting, hematochezia or melena  HPI  Past Medical History:  Diagnosis Date  . Chronic low back pain 2010     Patient Active Problem List   Diagnosis Date Noted  . Chronic lumbar radiculopathy 02/10/2017  . Hyperlipidemia 03/14/2016  . Lumbar degenerative disc disease 09/11/2015  . Chronic pain syndrome 09/11/2015  . Pain in the chest 09/08/2015  . 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 Former heavy cigarette smoker (20-39 per day) 03/17/2014  . Chronic low back pain     Past Surgical History:  Procedure Laterality Date  . COLONOSCOPY N/A 05/15/2015   Procedure: COLONOSCOPY;  Surgeon: Danie Binder, MD;  [facility];  Service: Endoscopy;  Laterality: N/A;  145 - moved to 1:30 - office to notify  . None          Home Medications    Prior to Admission medications   Medication Sig Start Date End Date Taking? Authorizing Provider  atorvastatin (LIPITOR) 20 MG  tablet Take 1 tablet (20 mg total) by mouth daily. 02/02/17  Yes Ladell Pier, MD  celecoxib (CELEBREX) 200 MG capsule Take 1 capsule (200 mg total) by mouth daily. 06/15/17  Yes Ladell Pier, MD  omeprazole (PRILOSEC) 20 MG capsule Take 20 mg by mouth daily as needed. 08/23/17  Yes [provider]  traMADol (ULTRAM) 50 MG tablet Take 1 tablet (50 mg total) by mouth every 8 (eight) hours as needed. 08/15/17  Yes Ladell Pier, MD  dicyclomine (BENTYL) 20 MG tablet Take 1 tablet (20 mg total) by mouth 2 (two) times daily. 08/27/17   Margarita Mail, PA-C  famotidine (PEPCID) 20 MG tablet Take 1 tablet (20 mg total) by mouth 2 (two) times daily. 08/27/17   Madeleyn Schwimmer, Vernie Shanks, PA-C  sucralfate (CARAFATE) 1 g tablet Take 1 tablet (1 g total) by mouth 4 (four) times daily -  with meals and at bedtime. 08/27/17   Margarita Mail, PA-C    Family History Family History  Problem Relation Age of Onset  . Cancer Mother        "cancer where they put baby", from description sounds uterine   . Heart disease Neg Hx   . Hypertension Neg Hx   . Diabetes Neg Hx   . Colon cancer Neg Hx     Social History Social  History   Tobacco Use  . Smoking status: Former Smoker    Packs/day: 0.75    Years: 25.00    Pack years: 18.75    Types: Cigarettes    Last attempt to quit: 09/19/2015    Years since quitting: 1.9  . Smokeless tobacco: Never Used  . Tobacco comment: 10 cigarettes a day   Substance Use Topics  . Alcohol use: Yes    Alcohol/week: 0.0 oz    Comment: social   . Drug use: No     Allergies   Patient has no known allergies.   Review of Systems Review of Systems Ten systems reviewed and are negative for acute change, except as noted in the HPI.    Physical Exam Updated Vital Signs BP 116/82   Pulse 71   Temp 98.5 F (36.9 C) (Oral)   Resp 18   Ht 5\' 5"  (1.651 m)   Wt 64.9 kg (143 lb)   SpO2 100%   BMI 23.80 kg/m   Physical Exam  Constitutional: He appears  well-developed and well-nourished. No distress.  HENT:  Head: Normocephalic and atraumatic.  Eyes: Conjunctivae are normal. No scleral icterus.  Neck: Normal range of motion. Neck supple.  Cardiovascular: Normal rate, regular rhythm, normal heart sounds and intact distal pulses.  Pulmonary/Chest: Effort normal and breath sounds normal. No respiratory distress.  Abdominal: Soft. Normal appearance, normal aorta and bowel sounds are normal. There is no tenderness. There is no rigidity, no guarding and no CVA tenderness.  Musculoskeletal: He exhibits no edema.  Neurological: He is alert.  Skin: Skin is warm and dry. He is not diaphoretic.  Psychiatric: His behavior is normal.  Nursing note and vitals reviewed.    ED Treatments / Results  Labs (all labs ordered are listed, but only abnormal results are displayed) Labs Reviewed  COMPREHENSIVE METABOLIC PANEL - Abnormal; Notable for the following components:      Result Value   Glucose, Bld 116 (*)    Total Protein 6.4 (*)    All other components within normal limits  LIPASE, BLOOD  CBC  URINALYSIS, ROUTINE W REFLEX MICROSCOPIC    EKG None  Radiology No results found.  Procedures Procedures (including critical care time)  Medications Ordered in ED Medications  gi cocktail (Maalox,Lidocaine,Donnatal) (30 mLs Oral Given 08/27/17 1155)  sucralfate (CARAFATE) tablet 1 g (1 g Oral Given 08/27/17 1155)  famotidine (PEPCID) IVPB 20 mg premix (0 mg Intravenous Stopped 08/27/17 1444)     Initial Impression / Assessment and Plan / ED Course  I have reviewed the triage vital signs and the nursing notes.  Pertinent labs & imaging results that were available during my care of the patient were reviewed by me and considered in my medical decision making (see chart for details).    Patient labs reviewed and are unremarkable for any sniffing in our abnormalities.  Patient burning abdominal pain is significantly improved with intervention  here.  He has normal pulses and sensation to sharp and dull touch in the extremities and currently not having any of the burning sensation in his palms or feet.  Perhaps he is having hyperventilation syndrome secondary to epigastric abdominal pain which could cause shallow rapid breathing at times.  He is ambulatory here without any nausea or vomiting.  I doubt ACS .  Patient will be discharged with Carafate Bentyl and Pepcid and appears appropriate for outpatient follow-up.  He is advised to follow-up with his PCP as he is a Medicaid  patient and will need referral to gastroenterology.  Discussed return precautions with the patient thoroughly using the translator patient understands and is able to verbally repeat these return precautions.     Final Clinical Impressions(s) / ED Diagnoses   Final diagnoses:  Reflux gastritis    ED Discharge Orders        Ordered    famotidine (PEPCID) 20 MG tablet  2 times daily,   Status:  Discontinued     08/27/17 1355    sucralfate (CARAFATE) 1 g tablet  3 times daily with meals & bedtime,   Status:  Discontinued     08/27/17 1355    dicyclomine (BENTYL) 20 MG tablet  2 times daily,   Status:  Discontinued     08/27/17 1355    sucralfate (CARAFATE) 1 g tablet  3 times daily with meals & bedtime     08/27/17 1409    famotidine (PEPCID) 20 MG tablet  2 times daily     08/27/17 1409    dicyclomine (BENTYL) 20 MG tablet  2 times daily     08/27/17 1409       Margarita Mail, PA-C 08/27/17 1642    Dorie Rank, MD 08/27/17 1700

## 2017-09-04 ENCOUNTER — Emergency Department (HOSPITAL_COMMUNITY)
Admission: EM | Admit: 2017-09-04 | Discharge: 2017-09-04 | Disposition: A | Discharge status: Home / Self Care | Payer: Medicaid Other | Attending: Emergency Medicine | Admitting: Emergency Medicine

## 2017-09-04 ENCOUNTER — Encounter (HOSPITAL_COMMUNITY): Payer: Self-pay | Admitting: Emergency Medicine

## 2017-09-04 ENCOUNTER — Other Ambulatory Visit: Payer: Self-pay

## 2017-09-04 DIAGNOSIS — Z79899 Other long term (current) drug therapy: Secondary | ICD-10-CM | POA: Diagnosis not present

## 2017-09-04 DIAGNOSIS — Z87891 Personal history of nicotine dependence: Secondary | ICD-10-CM | POA: Insufficient documentation

## 2017-09-04 DIAGNOSIS — M79673 Pain in unspecified foot: Secondary | ICD-10-CM | POA: Diagnosis not present

## 2017-09-04 DIAGNOSIS — R1013 Epigastric pain: Secondary | ICD-10-CM | POA: Insufficient documentation

## 2017-09-04 DIAGNOSIS — M79643 Pain in unspecified hand: Secondary | ICD-10-CM | POA: Insufficient documentation

## 2017-09-04 LAB — COMPREHENSIVE METABOLIC PANEL
ALK PHOS: 52 U/L (ref 38–126)
ALT: 33 U/L (ref 0–44)
AST: 24 U/L (ref 15–41)
Albumin: 3.7 g/dL (ref 3.5–5.0)
Anion gap: 7 (ref 5–15)
BUN: 7 mg/dL (ref 6–20)
CO2: 29 mmol/L (ref 22–32)
CREATININE: 0.68 mg/dL (ref 0.61–1.24)
Calcium: 9.2 mg/dL (ref 8.9–10.3)
Chloride: 105 mmol/L (ref 98–111)
GFR calc Af Amer: >60 mL/min (ref >60)
GFR calc non Af Amer: >60 mL/min (ref >60)
Glucose, Bld: 95 mg/dL (ref 70–99)
Potassium: 4.6 mmol/L (ref 3.5–5.1)
SODIUM: 141 mmol/L (ref 135–145)
Total Bilirubin: 0.6 mg/dL (ref 0.3–1.2)
Total Protein: 6.6 g/dL (ref 6.5–8.1)

## 2017-09-04 LAB — CBC WITH DIFFERENTIAL/PLATELET
Abs Immature Granulocytes: 0 10*3/uL (ref 0.0–0.1)
Basophils Absolute: 0 10*3/uL (ref 0.0–0.1)
Basophils Relative: 1 %
EOS PCT: 2 %
Eosinophils Absolute: 0.1 10*3/uL (ref 0.0–0.7)
HCT: 47 % (ref 39.0–52.0)
Hemoglobin: 15.5 g/dL (ref 13.0–17.0)
Immature Granulocytes: 0 %
Lymphocytes Relative: 52 %
Lymphs Abs: 3.1 10*3/uL (ref 0.7–4.0)
MCH: 32.2 pg (ref 26.0–34.0)
MCHC: 33 g/dL (ref 30.0–36.0)
MCV: 97.5 fL (ref 78.0–100.0)
MONOS PCT: 10 %
Monocytes Absolute: 0.6 10*3/uL (ref 0.1–1.0)
Neutro Abs: 2 10*3/uL (ref 1.7–7.7)
Neutrophils Relative %: 35 %
Platelets: 236 10*3/uL (ref 150–400)
RBC: 4.82 MIL/uL (ref 4.22–5.81)
RDW: 13 % (ref 11.5–15.5)
WBC: 5.8 10*3/uL (ref 4.0–10.5)

## 2017-09-04 LAB — LIPASE, BLOOD: Lipase: 40 U/L (ref 11–51)

## 2017-09-04 MED ORDER — PANTOPRAZOLE SODIUM 20 MG PO TBEC: 20.0000 mg | DELAYED_RELEASE_TABLET | Freq: Every day | ORAL | 1 refills | Status: DC

## 2017-09-04 MED ORDER — GI COCKTAIL ~~LOC~~
30.0000 mL | Freq: Once | ORAL | Status: AC
  Administered 2017-09-04: 30 mL via ORAL
  Filled 2017-09-04: qty 30

## 2017-09-04 MED FILL — PANTOPRAZOLE SOD DR 20 MG T: 20 | 30 days supply | Qty: 30 | Fill #0

## 2017-09-04 NOTE — ED Provider Notes (Signed)
[facility] EMERGENCY DEPARTMENT Provider Note   CSN: [id number] Arrival date & time: 09/04/17  5621     History   Chief Complaint Chief Complaint  Patient presents with  . Leg Pain  . Hand Pain    HPI Nathaniel Robinson is a 62 y.o. male.  A language interpreter was used.     Nathaniel Robinson is a 62 y.o. male, with a history of chronic lower back pain, presenting to the ED with discomfort in the hands and feet for a month.  Describes the sensation as burning.  Additionally complains of burning in the epigastric region, 5/10, nonradiating, constant.  He states he had this problem 10 years ago in [place], "was given a pill," and symptoms resolved.  Denies fever/chills, N/V/C/D, chest pain, shortness of breath, dizziness, diaphoresis, hematochezia/melena, rashes, neck/back pain, urinary abnormalities, numbness, weakness, headaches, or any other complaints.     Past Medical History:  Diagnosis Date  . Chronic low back pain 2010     Patient Active Problem List   Diagnosis Date Noted  . Chronic lumbar radiculopathy 02/10/2017  . Hyperlipidemia 03/14/2016  . Lumbar degenerative disc disease 09/11/2015  . Chronic pain syndrome 09/11/2015  . Pain in the chest 09/08/2015  . 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 Former heavy cigarette smoker (20-39 per day) 03/17/2014  . Chronic low back pain     Past Surgical History:  Procedure Laterality Date  . COLONOSCOPY N/A 05/15/2015   Procedure: COLONOSCOPY;  Surgeon: Danie Binder, MD;  Location: [facility];  Service: Endoscopy;  Laterality: N/A;  145 - moved to 1:30 - office to notify  . None          Home Medications    Prior to Admission medications   Medication Sig Start Date End Date Taking? Authorizing Provider  dicyclomine (BENTYL) 20 MG tablet Take 1 tablet (20 mg total) by mouth 2 (two) times daily. 08/27/17  Yes Harris, Abigail, PA-C  famotidine (PEPCID) 20 MG tablet  Take 1 tablet (20 mg total) by mouth 2 (two) times daily. 08/27/17  Yes Harris, Abigail, PA-C  sucralfate (CARAFATE) 1 g tablet Take 1 tablet (1 g total) by mouth 4 (four) times daily -  with meals and at bedtime. 08/27/17  Yes Harris, Abigail, PA-C  atorvastatin (LIPITOR) 20 MG tablet Take 1 tablet (20 mg total) by mouth daily. Patient not taking: Reported on 09/04/2017 02/02/17   Ladell Pier, MD  celecoxib (CELEBREX) 200 MG capsule Take 1 capsule (200 mg total) by mouth daily. Patient not taking: Reported on 09/04/2017 06/15/17   Ladell Pier, MD  pantoprazole (PROTONIX) 20 MG tablet Take 1 tablet (20 mg total) by mouth daily. 09/04/17 11/03/17  Raymone Pembroke C, PA-C  traMADol (ULTRAM) 50 MG tablet Take 1 tablet (50 mg total) by mouth every 8 (eight) hours as needed. Patient not taking: Reported on 09/04/2017 08/15/17   Ladell Pier, MD    Family History Family History  Problem Relation Age of Onset  . Cancer Mother        "cancer where they put baby", from description sounds uterine   . Heart disease Neg Hx   . Hypertension Neg Hx   . Diabetes Neg Hx   . Colon cancer Neg Hx     Social History Social History   Tobacco Use  . Smoking status: Former Smoker    Packs/day: 0.75    Years: 25.00  Pack years: 18.75    Types: Cigarettes    Last attempt to quit: 09/19/2015    Years since quitting: 1.9  . Smokeless tobacco: Never Used  . Tobacco comment: 10 cigarettes a day   Substance Use Topics  . Alcohol use: Yes    Alcohol/week: 0.0 oz    Comment: social   . Drug use: No     Allergies   Patient has no known allergies.   Review of Systems Review of Systems  Constitutional: Negative for chills, diaphoresis and fever.  Respiratory: Negative for cough and shortness of breath.   Cardiovascular: Negative for chest pain.  Gastrointestinal: Positive for abdominal pain. Negative for blood in stool, constipation, diarrhea, nausea and vomiting.  Genitourinary: Negative for  difficulty urinating, dysuria, frequency and hematuria.  Musculoskeletal: Negative for back pain and neck pain.  Neurological: Negative for dizziness, tremors, syncope, weakness, light-headedness, numbness and headaches.  All other systems reviewed and are negative.    Physical Exam Updated Vital Signs BP 100/74 (BP Location: Right Arm)   Pulse (!) 101   Temp 98.4 F (36.9 C) (Oral)   Resp 16   Ht 5\' 5"  (1.651 m)   Wt 64.9 kg (143 lb)   SpO2 98%   BMI 23.80 kg/m   Physical Exam  Constitutional: He is oriented to person, place, and time. He appears well-developed and well-nourished. No distress.  HENT:  Head: Normocephalic and atraumatic.  Mouth/Throat: Oropharynx is clear and moist.  Eyes: Pupils are equal, round, and reactive to light. Conjunctivae and EOM are normal.  Neck: Normal range of motion. Neck supple.  Cardiovascular: Normal rate, regular rhythm, normal heart sounds and intact distal pulses.  Pulmonary/Chest: Effort normal and breath sounds normal. No respiratory distress.  Abdominal: Soft. There is no tenderness. There is no guarding.  Musculoskeletal: He exhibits no edema.  Normal motor function intact in all extremities and spine. No midline spinal tenderness.   Lymphadenopathy:    He has no cervical adenopathy.  Neurological: He is alert and oriented to person, place, and time.  Sensation grossly intact in the extremities. No noted speech deficits. No aphasia. Patient handles oral secretions without difficulty. No noted swallowing defects.  Equal grip strength bilaterally. Strength 5/5 in the upper extremities. Strength 5/5 with flexion and extension of the hips, knees, and ankles bilaterally.  Patellar DTRs 2+ bilaterally No gait disturbance.  Coordination intact including heel to shin and finger to nose.  Cranial nerves III-XII grossly intact.  No facial droop.   Skin: Skin is warm and dry. Capillary refill takes less than 2 seconds. He is not  diaphoretic.  Psychiatric: He has a normal mood and affect. His behavior is normal.  Nursing note and vitals reviewed.    ED Treatments / Results  Labs (all labs ordered are listed, but only abnormal results are displayed) Labs Reviewed  COMPREHENSIVE METABOLIC PANEL  CBC WITH DIFFERENTIAL/PLATELET  LIPASE, BLOOD    EKG None  Radiology No results found.  Procedures Procedures (including critical care time)  Medications Ordered in ED Medications  gi cocktail (Maalox,Lidocaine,Donnatal) (30 mLs Oral Given 09/04/17 1251)     Initial Impression / Assessment and Plan / ED Course  I have reviewed the triage vital signs and the nursing notes.  Pertinent labs & imaging results that were available during my care of the patient were reviewed by me and considered in my medical decision making (see chart for details).     Patient presents with tingling sensation to  the hands and feet.  Appears to be neurovascularly intact. Patient is nontoxic appearing, afebrile, not tachycardic, not tachypneic, not hypotensive, maintains excellent SPO2 on room air, and is in no apparent distress. Lab results reassuring.  Benign abdominal exam. Tolerating PO. Follow up with PCP and GI on this matter. The patient was given instructions for home care as well as return precautions. Patient voices understanding of these instructions, accepts the plan, and is comfortable with discharge.   Vitals:   09/04/17 0934 09/04/17 0940 09/04/17 1307  BP: 100/74  116/79  Pulse: (!) 101  65  Resp: 16  18  Temp: 98.4 F (36.9 C)  98.7 F (37.1 C)  TempSrc: Oral  Oral  SpO2: 98%  97%  Weight:  64.9 kg (143 lb)   Height:  5\' 5"  (1.651 m)      Final Clinical Impressions(s) / ED Diagnoses   Final diagnoses:  Hand and foot pain, unspecified laterality  Epigastric pain    ED Discharge Orders        Ordered    pantoprazole (PROTONIX) 20 MG tablet  Daily     09/04/17 1307       Lorayne Bender,  PA-C 09/04/17 1312    Blanchie Dessert, MD 09/04/17 2050

## 2017-09-04 NOTE — ED Notes (Signed)
Patient given discharge instructions and verbalized understanding.  Patient stable to discharge at this time.  Patient is alert and oriented to baseline.  No distressed noted at this time.  All belongings taken with the patient at discharge.   

## 2017-09-04 NOTE — Discharge Instructions (Addendum)
Your lab results were reassuring.  Diet: Start with a clear liquid diet, progressed to a full liquid diet, and then bland solids as you are able. Please adhere to the enclosed dietary suggestions.  In general, avoid NSAIDs (i.e. ibuprofen, naproxen, etc.), caffeine, alcohol, spicy foods, fatty foods, or any other foods that seem to cause your symptoms to arise.  Protonix: Take this medication daily, 20-30 minutes prior to your first meal, for the next 8 weeks.  Continue to take this medication even if you begin to feel better.  Pepcid: Take this medication twice a day for the next 5 days.  Follow-up: Please follow-up with your primary care provider on this matter.  Return: Return to the ED for significantly worsening symptoms, persistent vomiting, persistent fever, vomiting blood, blood in the stools, dark stools, or any other major concerns.

## 2017-09-04 NOTE — ED Triage Notes (Signed)
Pt. here a week ago for the same symptoms.

## 2017-09-04 NOTE — ED Triage Notes (Signed)
Translator/pt. Stated, Im having burning sensation on my hands , buttocks, legs, started one month.

## 2017-09-08 ENCOUNTER — Telehealth: Payer: Self-pay | Admitting: General Practice

## 2017-09-08 NOTE — Telephone Encounter (Signed)
I received a call from the patient stating he has severe abd pain and burning in his arms and legs.  I spoke with Tamela Oddi and she advised me to tell the patient to proceed to the nearest ER.  Patient is on the way to the ER.  Routing to [employer] as a [employer]

## 2017-09-11 NOTE — Telephone Encounter (Signed)
Noted  

## 2017-09-13 NOTE — Telephone Encounter (Signed)
Noted, no further recommendations. 

## 2017-09-15 ENCOUNTER — Ambulatory Visit: Payer: Medicaid Other | Attending: Internal Medicine | Admitting: Internal Medicine

## 2017-09-15 ENCOUNTER — Encounter: Payer: Self-pay | Admitting: Internal Medicine

## 2017-09-15 ENCOUNTER — Ambulatory Visit (HOSPITAL_COMMUNITY)
Admission: RE | Admit: 2017-09-15 | Discharge: 2017-09-15 | Disposition: A | Discharge status: Home / Self Care | Payer: Medicaid Other | Source: Ambulatory Visit | Attending: Internal Medicine | Admitting: Internal Medicine

## 2017-09-15 VITALS — BP 108/67 | HR 83 | Temp 98.1°F | Resp 16 | Ht 66.0 in | Wt 145.6 lb

## 2017-09-15 DIAGNOSIS — K219 Gastro-esophageal reflux disease without esophagitis: Secondary | ICD-10-CM | POA: Insufficient documentation

## 2017-09-15 DIAGNOSIS — G8929 Other chronic pain: Secondary | ICD-10-CM | POA: Diagnosis not present

## 2017-09-15 DIAGNOSIS — G894 Chronic pain syndrome: Secondary | ICD-10-CM | POA: Diagnosis not present

## 2017-09-15 DIAGNOSIS — M545 Low back pain, unspecified: Secondary | ICD-10-CM

## 2017-09-15 DIAGNOSIS — E785 Hyperlipidemia, unspecified: Secondary | ICD-10-CM

## 2017-09-15 DIAGNOSIS — Z87891 Personal history of nicotine dependence: Secondary | ICD-10-CM | POA: Insufficient documentation

## 2017-09-15 DIAGNOSIS — M546 Pain in thoracic spine: Secondary | ICD-10-CM | POA: Diagnosis not present

## 2017-09-15 DIAGNOSIS — G609 Hereditary and idiopathic neuropathy, unspecified: Secondary | ICD-10-CM | POA: Diagnosis not present

## 2017-09-15 DIAGNOSIS — D170 Benign lipomatous neoplasm of skin and subcutaneous tissue of head, face and neck: Secondary | ICD-10-CM | POA: Insufficient documentation

## 2017-09-15 DIAGNOSIS — Z0000 Encounter for general adult medical examination without abnormal findings: Secondary | ICD-10-CM | POA: Insufficient documentation

## 2017-09-15 DIAGNOSIS — M5116 Intervertebral disc disorders with radiculopathy, lumbar region: Secondary | ICD-10-CM | POA: Diagnosis not present

## 2017-09-15 DIAGNOSIS — M549 Dorsalgia, unspecified: Secondary | ICD-10-CM | POA: Diagnosis not present

## 2017-09-15 MED ORDER — ATORVASTATIN CALCIUM 20 MG PO TABS: 20.0000 mg | ORAL_TABLET | Freq: Every day | ORAL | 99 refills | Status: DC

## 2017-09-15 MED ORDER — PANTOPRAZOLE SODIUM 20 MG PO TBEC: 20.0000 mg | DELAYED_RELEASE_TABLET | Freq: Every day | ORAL | 4 refills | Status: DC

## 2017-09-15 MED ORDER — TRAMADOL HCL 50 MG PO TABS: 50.0000 mg | ORAL_TABLET | Freq: Three times a day (TID) | ORAL | 0 refills | Status: DC | PRN

## 2017-09-15 MED ORDER — GABAPENTIN 300 MG PO CAPS: 300.0000 mg | ORAL_CAPSULE | Freq: Every day | ORAL | 3 refills | Status: DC

## 2017-09-15 MED FILL — ATORVASTATIN 20 MG TABLET: 20 | 30 days supply | Qty: 30 | Fill #0

## 2017-09-15 MED FILL — traMADol HCL 50 MG TABS: 50 | 7 days supply | Qty: 21 | Fill #0

## 2017-09-15 MED FILL — GABAPENTIN 300 MG CAPSULE: 300 | 30 days supply | Qty: 30 | Fill #0

## 2017-09-15 NOTE — Progress Notes (Signed)
Patient ID: Nathaniel Robinson, male    DOB: [birth year]  MRN: [id number]  CC: Annual Exam   Subjective: Nathaniel Robinson is a 62 y.o. male who presents for annual exam and ER follow-up.  He speaks [place]. Interpreter, Nathaniel Robinson, from [employer],is with him.  His concerns today include:  Pt with hx of HL, chronic LBP, GERD  Seen in ER 7/14 for what sounds like reflux symptoms.  Rxn Bentyl and Pepcid.  Did not work.  Went back to ER 09/04/2017 for burning in hands and feet and continued reflux symptoms.  Placed on Protonix. He stopped Lipitor and Celebrex.  No OTC med -reports burning in stomach and chest when he eats spicy foods. Also causes bitter taste in mouth.  Episodes are intermittent. -no blood in stools or black stools.  No wgh loss -No burning in the chest or abdomen with exertion. -Protonix has helped a lot.  Request refill  C/o Burning in palms hands and soles of feet x 1 mth.  Constant.  Thinks that palms of the hands stay red.  Denies any color changes with change in temperature.  No joint pains in the hands or feet. No thinks veins on the dorsum of hands more prominent. No neck pain or pain/numbness/burning in arms No numbness or pain in legs.  No burning in legs Also feels burning in his back b/w the shoulder x 1 mth  He has chronic lower back pain for which he sees Dr. Tivis Robinson.  He gets intermittent nerve blocks in the lumbar region.  He finds that they help for about 6 weeks.  He had stopped taking tramadol and Celebrex when he started having acid reflux symptoms.  He is having some pain in the lower back  HM: Patient up-to-date with age-appropriate health maintenance.  Will need shingles vaccine  Patient Active Problem List   Diagnosis Date Noted  . Chronic lumbar radiculopathy 02/10/2017  . Hyperlipidemia 03/14/2016  . Lumbar degenerative disc disease 09/11/2015  . Chronic pain syndrome 09/11/2015  . Pain in the chest 09/08/2015  . Sacroiliac joint disease  08/12/2014  . 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 Former heavy cigarette smoker (20-39 per day) 03/17/2014  . Chronic low back pain      No current outpatient medications on file prior to visit.   No current facility-administered medications on file prior to visit.     No Known Allergies  Social History   Socioeconomic History  . Marital status: Married    Spouse name: Not on file  . Number of children: 4  . Years of education: Not on file  . Highest education level: Not on file  Occupational History  . Occupation: Previous Optometrist     Comment: [place]   Social Needs  . Financial resource strain: Not on file  . Food insecurity:    Worry: Not on file    Inability: Not on file  . Transportation needs:    Medical: Not on file    Non-medical: Not on file  Tobacco Use  . Smoking status: Former Smoker    Packs/day: 0.75    Years: 25.00    Pack years: 18.75    Types: Cigarettes    Last attempt to quit: 09/19/2015    Years since quitting: 1.9  . Smokeless tobacco: Never Used  . Tobacco comment: 10 cigarettes a day   Substance and Sexual Activity  . Alcohol use: Yes    Alcohol/week: 0.0 oz    Comment: social   .  Drug use: No  . Sexual activity: Never  Lifestyle  . Physical activity:    Days per week: Not on file    Minutes per session: Not on file  . Stress: Not on file  Relationships  . Social connections:    Talks on phone: Not on file    Gets together: Not on file    Attends religious service: Not on file    Active member of club or organization: Not on file    Attends meetings of clubs or organizations: Not on file    Relationship status: Not on file  . Intimate partner violence:    Fear of current or ex partner: Not on file    Emotionally abused: Not on file    Physically abused: Not on file    Forced sexual activity: Not on file  Other Topics Concern  . Not on file  Social History Narrative   Form [place]   Moved to [place] in 01/2014    Lives in [place] alone     Wife and 4 children in [place].     Family History  Problem Relation Age of Onset  . Cancer Mother        "cancer where they put baby", from description sounds uterine   . Heart disease Neg Hx   . Hypertension Neg Hx   . Diabetes Neg Hx   . Colon cancer Neg Hx     Past Surgical History:  Procedure Laterality Date  . COLONOSCOPY N/A 05/15/2015   Procedure: COLONOSCOPY;  Surgeon: Nathaniel Binder, MD;  Location: [facility];  Service: Endoscopy;  Laterality: N/A;  145 - moved to 1:30 - office to notify  . None      ROS: Review of Systems  Constitutional: Negative for activity change, appetite change and unexpected weight change.  HENT: Negative for congestion and hearing loss.   Eyes: Negative for visual disturbance.  Respiratory: Negative for chest tightness and shortness of breath.   Cardiovascular: Negative for chest pain and leg swelling.  Gastrointestinal: Negative for nausea and vomiting.  Genitourinary: Negative for difficulty urinating.  Musculoskeletal: Negative for gait problem.  Neurological: Negative for dizziness.  Psychiatric/Behavioral: Negative for agitation.    PHYSICAL EXAM: BP 108/67   Pulse 83   Temp 98.1 F (36.7 C) (Oral)   Resp 16   Ht 5\' 6"  (1.676 m)   Wt 145 lb 9.6 oz (66 kg)   SpO2 99%   BMI 23.50 kg/m   Wt Readings from Last 3 Encounters:  09/15/17 145 lb 9.6 oz (66 kg)  09/04/17 143 lb (64.9 kg)  08/27/17 143 lb (64.9 kg)    Physical Exam General appearance - alert, well appearing, and in no distress Mental status - normal mood, behavior, speech, dress, motor activity, and thought processes Eyes - pupils equal and reactive, extraocular eye movements intact Ears - bilateral TM's and external ear canals normal Nose - normal and patent, no erythema, discharge or polyps Mouth - mucous membranes moist, pharynx normal without lesions Neck - supple, no significant adenopathy, no thyroid enlargement. Lymphatics -no cervical, axillary  lymphadenopathy. Chest - clear to auscultation, no wheezes, rales or rhonchi, symmetric air entry Heart - normal rate, regular rhythm, normal S1, S2, no murmurs, rubs, clicks or gallops Abdomen - soft, nontender, nondistended, no masses or organomegaly Neurological - cranial nerves II through XII intact, DTR's normal and symmetric, motor and sensory grossly normal bilaterally Extremities - peripheral pulses normal (radial and brachial pulses equal  bilaterally.  Dorsalis pedis and posterior tibialis pulses 3+ bilaterally), no pedal edema, no clubbing or cyanosis Skin -no abnormalities noted of the veins on the dorsum of the hands.  No edema or erythema noted on the palms.  Good capillary refills of the nailbeds on both hands and the feet. 3 cm soft movable mass at the base of the posterior neck right side MSK: No tenderness on palpation of the thoracic spine    Chemistry      Component Value Date/Time   NA 141 09/04/2017 1209   K 4.6 09/04/2017 1209   CL 105 09/04/2017 1209   CO2 29 09/04/2017 1209   BUN 7 09/04/2017 1209   CREATININE 0.68 09/04/2017 1209   CREATININE 0.68 (L) 09/08/2015 1041      Component Value Date/Time   CALCIUM 9.2 09/04/2017 1209   ALKPHOS 52 09/04/2017 1209   AST 24 09/04/2017 1209   ALT 33 09/04/2017 1209   BILITOT 0.6 09/04/2017 1209   BILITOT 0.3 02/02/2017 1018     Lab Results  Component Value Date   WBC 5.8 09/04/2017   HGB 15.5 09/04/2017   HCT 47.0 09/04/2017   MCV 97.5 09/04/2017   PLT 236 09/04/2017    ASSESSMENT AND PLAN: 1. Annual physical exam Patient up-to-date with age-appropriate cancer screenings. Needs Shingrix which we are out of.  We will give on follow-up visit.  Will be due for flu vaccine in the fall.  2. Gastroesophageal reflux disease without esophagitis GERD precautions discussed in details. Advised to avoid spicy foods, foods that have tomato sauce, juices or any other foods that cause flareup of his symptoms. We will  stop the Celebrex for now.  Advised against use of over-the-counter NSAIDs. Continue Protonix - pantoprazole (PROTONIX) 20 MG tablet; Take 1 tablet (20 mg total) by mouth daily.  Dispense: 30 tablet; Refill: 4  3. Idiopathic peripheral neuropathy Questionable etiology for the burning in his hands and feet.  Will check B12 and folate level and diabetic screen.  In the meantime he is agreeable to trying a low-dose of gabapentin at bedtime - TSH - Vitamin B12 - Folate - Hemoglobin A1c - gabapentin (NEURONTIN) 300 MG capsule; Take 1 capsule (300 mg total) by mouth at bedtime.  Dispense: 30 capsule; Refill: 3  4. Hyperlipidemia, unspecified hyperlipidemia type Advised to restart the Lipitor as I do not think this has contributed to GERD symptoms - atorvastatin (LIPITOR) 20 MG tablet; Take 1 tablet (20 mg total) by mouth daily.  Dispense: 90 tablet; Refill: prn  5. Chronic left-sided low back pain without sciatica -Refill given on tramadol.  Controlled substance prescribing agreement was done on 06/15/2017 - traMADol (ULTRAM) 50 MG tablet; Take 1 tablet (50 mg total) by mouth every 8 (eight) hours as needed for moderate pain.  Dispense: 60 tablet; Refill: 0  6. Lipoma of neck This was noted on exam .patient reports that he has had this for the past 6 years with no significant increase in size.  Observe for now.  If any increase in size we can refer to have it removed  7. Acute midline thoracic back pain - DG Thoracic Spine W/Swimmers; Future  Patient was given the opportunity to ask questions.  Patient verbalized understanding of the plan and was able to repeat key elements of the plan.   Orders Placed This Encounter  Procedures  . DG Thoracic Spine W/Swimmers  . TSH  . Vitamin B12  . Folate  . Hemoglobin A1c  Requested Prescriptions   Signed Prescriptions Disp Refills  . pantoprazole (PROTONIX) 20 MG tablet 30 tablet 4    Sig: Take 1 tablet (20 mg total) by mouth daily.  Marland Kitchen  atorvastatin (LIPITOR) 20 MG tablet 90 tablet prn    Sig: Take 1 tablet (20 mg total) by mouth daily.  . traMADol (ULTRAM) 50 MG tablet 60 tablet 0    Sig: Take 1 tablet (50 mg total) by mouth every 8 (eight) hours as needed for moderate pain.  Marland Kitchen gabapentin (NEURONTIN) 300 MG capsule 30 capsule 3    Sig: Take 1 capsule (300 mg total) by mouth at bedtime.    Return in about 6 weeks (around 10/27/2017).  Karle Plumber, MD, FACP

## 2017-09-16 LAB — VITAMIN B12: VITAMIN B 12: 605 pg/mL (ref 232–1245)

## 2017-09-16 LAB — FOLATE: FOLATE: 11.1 ng/mL (ref >3.0)

## 2017-09-16 LAB — TSH: TSH: 1.1 u[IU]/mL (ref 0.450–4.500)

## 2017-09-16 LAB — HEMOGLOBIN A1C
Est. average glucose Bld gHb Est-mCnc: 114 mg/dL
Hgb A1c MFr Bld: 5.6 % (ref 4.8–5.6)

## 2017-09-22 ENCOUNTER — Telehealth: Payer: Self-pay

## 2017-09-22 NOTE — Telephone Encounter (Signed)
Contacted pt to go over lab results and xray results pt is aware and doesn't have any questions or concerns  ?

## 2017-10-05 ENCOUNTER — Ambulatory Visit (INDEPENDENT_AMBULATORY_CARE_PROVIDER_SITE_OTHER): Payer: Medicaid Other | Admitting: Nurse Practitioner

## 2017-10-05 ENCOUNTER — Encounter: Payer: Self-pay | Admitting: Nurse Practitioner

## 2017-10-05 VITALS — BP 108/72 | HR 74 | Temp 97.2°F | Ht 66.0 in | Wt 142.8 lb

## 2017-10-05 DIAGNOSIS — K219 Gastro-esophageal reflux disease without esophagitis: Secondary | ICD-10-CM

## 2017-10-05 DIAGNOSIS — R1013 Epigastric pain: Secondary | ICD-10-CM | POA: Diagnosis not present

## 2017-10-05 DIAGNOSIS — R109 Unspecified abdominal pain: Secondary | ICD-10-CM | POA: Insufficient documentation

## 2017-10-05 NOTE — Progress Notes (Signed)
cc'ed to pcp °

## 2017-10-05 NOTE — Assessment & Plan Note (Signed)
The patient was in the emergency room recently likely had GERD.  He responded to ER treatment.  He was started on Protonix.  He is no longer having GERD symptoms.  His GERD symptoms were "stomach burning" which is completely resolved.  No dysphasia symptoms.  Recommend he continue PPI, return for follow-up as needed.

## 2017-10-05 NOTE — Progress Notes (Signed)
Referring Provider: Ladell Pier, MD Primary Care Physician:  Ladell Pier, MD Primary GI:  Dr. Oneida Alar  Chief Complaint  Patient presents with  . Gastroesophageal Reflux    c/o burning in stomach when he eats    HPI:   Nathaniel Robinson is a 62 y.o. male who presents for evaluation of GERD.  Patient was last seen in our office 04/28/2015 for encounter for screening colonoscopy.  At that time he had no GI complaints and specifically no reflux or dysphasia.  His colonoscopy is up-to-date.  He was seen in the emergency department 08/27/2017 for epigastric abdominal pain.  At that time he noted long-standing issue with reflux for the past 2 weeks it is gotten more intense and severe.  This is worsened by spicy foods and when laying flat at night.  Omeprazole has provided no relief.  Denies alcohol.  History of diabetes and complains of burning in his hands and feet which could represent diabetic neuropathy.  He is concerned about possible peptic ulcer and need for endoscopy.  While in the emergency department he was given a GI cocktail, Carafate, Pepcid.  Labs essentially normal and some improvement with ER treatment.  He was discharged with Carafate, Bentyl, Pepcid and recommended follow-up with primary care and GI.  Today he is accompanied by a family member and a [employer] copy. He states he's been having symptoms for about 2 months. He states his stomach burning is better on Protonix. His main complaint is burning in his hands, feet, and back which has been going on for 10 years. He saw PCP who told him he doesn't have diabetes. He feels his hands/feet/back pain is because of his stomach burning. When he has stomach burning it causes his hands, feet, and back to hurt. He's no longer having stomach burning but the result of his previous stomach burning is pain in his hands, feet, and back. He is wanting an EGD. His GERD is improved on Protonix. Denies breakthrough symptoms on Protonix.  Denies dysphagia, abdominal pain, N/V, hematochezia, melena. Denies chest pain, dyspnea, dizziness, lightheadedness, syncope, near syncope. Denies any other upper or lower GI symptoms.  Reviewed PCP notes who feels he has idiopathic peripheral neuropathy and started him on gabapentin, which he feels hasn't helped. Labs checked at that time include TSH, folate, B12 which were all normal. He has a follow-up appointment with PCP soon.  Past Medical History:  Diagnosis Date  . Chronic low back pain 2010   . GERD (gastroesophageal reflux disease)     Past Surgical History:  Procedure Laterality Date  . COLONOSCOPY N/A 05/15/2015   Procedure: COLONOSCOPY;  Surgeon: Danie Binder, MD;  [facility];  Service: Endoscopy;  Laterality: N/A;  145 - moved to 1:30 - office to notify  . None      Current Outpatient Medications  Medication Sig Dispense Refill  . atorvastatin (LIPITOR) 20 MG tablet Take 1 tablet (20 mg total) by mouth daily. 90 tablet prn  . gabapentin (NEURONTIN) 300 MG capsule Take 1 capsule (300 mg total) by mouth at bedtime. 30 capsule 3  . pantoprazole (PROTONIX) 20 MG tablet Take 1 tablet (20 mg total) by mouth daily. 30 tablet 4  . traMADol (ULTRAM) 50 MG tablet Take 1 tablet (50 mg total) by mouth every 8 (eight) hours as needed for moderate pain. 60 tablet 0   No current facility-administered medications for this visit.     Allergies as of 10/05/2017  . (  No Known Allergies)    Family History  Problem Relation Age of Onset  . Cancer Mother        "cancer where they put baby", from description sounds uterine   . Heart disease Neg Hx   . Hypertension Neg Hx   . Diabetes Neg Hx   . Colon cancer Neg Hx     Social History   Socioeconomic History  . Marital status: Married    Spouse name: Not on file  . Number of children: 4  . Years of education: Not on file  . Highest education level: Not on file  Occupational History  . Occupation: Previous  Optometrist     Comment: [place]   Social Needs  . Financial resource strain: Not on file  . Food insecurity:    Worry: Not on file    Inability: Not on file  . Transportation needs:    Medical: Not on file    Non-medical: Not on file  Tobacco Use  . Smoking status: Former Smoker    Packs/day: 0.75    Years: 25.00    Pack years: 18.75    Types: Cigarettes    Last attempt to quit: 09/19/2015    Years since quitting: 2.0  . Smokeless tobacco: Never Used  . Tobacco comment: 10 cigarettes a day   Substance and Sexual Activity  . Alcohol use: Yes    Alcohol/week: 0.0 standard drinks    Comment: social   . Drug use: No  . Sexual activity: Never  Lifestyle  . Physical activity:    Days per week: Not on file    Minutes per session: Not on file  . Stress: Not on file  Relationships  . Social connections:    Talks on phone: Not on file    Gets together: Not on file    Attends religious service: Not on file    Active member of club or organization: Not on file    Attends meetings of clubs or organizations: Not on file    Relationship status: Not on file  Other Topics Concern  . Not on file  Social History Narrative   Form [place]   Moved to [place] in 01/2014    Lives in [place] alone   Wife and 4 children in [place].     Review of Systems: Complete ROS negative except as per HPI.   Physical Exam: BP 108/72   Pulse 74   Temp (!) 97.2 F (36.2 C) (Oral)   Ht 5\' 6"  (1.676 m)   Wt 142 lb 12.8 oz (64.8 kg)   BMI 23.05 kg/m  General:   Alert and oriented. Pleasant and cooperative. Well-nourished and well-developed.  Eyes:  Without icterus, sclera clear and conjunctiva pink.  Ears:  Normal auditory acuity. Cardiovascular:  S1, S2 present without murmurs appreciated. Extremities without clubbing or edema. Respiratory:  Clear to auscultation bilaterally. No wheezes, rales, or rhonchi. No distress.  Gastrointestinal:  +BS, soft, non-tender and non-distended. No HSM noted. No  guarding or rebound. No masses appreciated.  Rectal:  Deferred  Musculoskalatal:  Symmetrical without gross deformities.  Neurologic:  Alert and oriented x4;  grossly normal neurologically. Psych:  Alert and cooperative. Normal mood and affect. Heme/Lymph/Immune: No excessive bruising noted.    10/05/2017 11:45 AM   Disclaimer: This note was dictated with voice recognition software. Similar sounding words can inadvertently be transcribed and may not be corrected upon review.

## 2017-10-05 NOTE — Assessment & Plan Note (Signed)
The patient's primary GI symptoms were stomach burning/abdominal pain in the epigastric/left upper quadrant region.  This is completely resolved on Protonix.  Recommend he continue this, follow-up as needed.  Follow-up with primary care for concerns about burning in the hands and feet, diagnosed as idiopathic peripheral neuropathy by primary care.

## 2017-10-05 NOTE — Patient Instructions (Signed)
English:  1. Continue taking Protonix once a day 2. Phone our office if your stomach burning gets worse 3. Phone our office if you have any questions or problems about your stomach burning   Amharic:  1. ??? ??? ?? ?????? ?????? ????? 2. ??? ???? ???? ??? ?? ???? ???? ???? ?? 3. ?? ?? ????? ????? ????? ??? ???? ???? ?????? ???? ??  At [facility] we value your feedback. You may receive a survey about your visit today. Please share your experience as we strive to create trusting relationships with our patients to provide genuine, compassionate, quality care.  We appreciate your understanding and patience as we review any laboratory studies, imaging, and other diagnostic tests that are ordered as we care for you. Our office policy is 5 business days for review of these results, and any emergent or urgent results are addressed in a timely manner for your best interest. If you do not hear from our office in 1 week, please contact us.   We also encourage the use of MyChart, which contains your medical information for your review as well. If you are not enrolled in this feature, an access code is available on this after visit summary for your convenience. Thank you for allowing [place] to be involved in the care of you and your family.   It was great to meet you today!  I hope you have a great summer!!

## 2017-10-06 MED FILL — PANTOPRAZOLE SOD DR 20 MG T: 20 | 30 days supply | Qty: 30 | Fill #1

## 2017-10-17 MED FILL — ATORVASTATIN 20 MG TABLET: 20 | 30 days supply | Qty: 30 | Fill #1

## 2017-10-17 MED FILL — traMADol HCL 50 MG TABS: 50 | 7 days supply | Qty: 21 | Fill #1

## 2017-10-31 ENCOUNTER — Ambulatory Visit: Payer: Medicaid Other | Attending: Internal Medicine | Admitting: Internal Medicine

## 2017-10-31 ENCOUNTER — Encounter: Payer: Self-pay | Admitting: Internal Medicine

## 2017-10-31 VITALS — BP 121/75 | HR 83 | Temp 98.3°F | Resp 16 | Wt 142.2 lb

## 2017-10-31 DIAGNOSIS — K219 Gastro-esophageal reflux disease without esophagitis: Secondary | ICD-10-CM | POA: Diagnosis not present

## 2017-10-31 DIAGNOSIS — Z87891 Personal history of nicotine dependence: Secondary | ICD-10-CM | POA: Insufficient documentation

## 2017-10-31 DIAGNOSIS — M545 Low back pain: Secondary | ICD-10-CM | POA: Diagnosis not present

## 2017-10-31 DIAGNOSIS — Z79899 Other long term (current) drug therapy: Secondary | ICD-10-CM | POA: Insufficient documentation

## 2017-10-31 DIAGNOSIS — G8929 Other chronic pain: Secondary | ICD-10-CM | POA: Diagnosis not present

## 2017-10-31 DIAGNOSIS — Z23 Encounter for immunization: Secondary | ICD-10-CM | POA: Diagnosis not present

## 2017-10-31 DIAGNOSIS — G609 Hereditary and idiopathic neuropathy, unspecified: Secondary | ICD-10-CM | POA: Diagnosis not present

## 2017-10-31 DIAGNOSIS — G894 Chronic pain syndrome: Secondary | ICD-10-CM | POA: Insufficient documentation

## 2017-10-31 DIAGNOSIS — M5116 Intervertebral disc disorders with radiculopathy, lumbar region: Secondary | ICD-10-CM | POA: Insufficient documentation

## 2017-10-31 DIAGNOSIS — E785 Hyperlipidemia, unspecified: Secondary | ICD-10-CM | POA: Diagnosis not present

## 2017-10-31 MED ORDER — TRAMADOL HCL 50 MG PO TABS: 50.0000 mg | ORAL_TABLET | Freq: Three times a day (TID) | ORAL | 1 refills | Status: DC | PRN

## 2017-10-31 MED FILL — GABAPENTIN 300 MG CAPSULE: 300 | 30 days supply | Qty: 30 | Fill #1

## 2017-10-31 MED FILL — traMADol HCL 50 MG TABS: 50 | 7 days supply | Qty: 21 | Fill #0

## 2017-10-31 NOTE — Patient Instructions (Signed)

## 2017-10-31 NOTE — Progress Notes (Signed)
Patient ID: Nathaniel Robinson, male    DOB: 31-Mar-1955  MRN: [id number]  CC: Follow-up (6 weeks)   Subjective: Nathaniel Robinson is a 62 y.o. male who presents for 6 weeks follow-up.   He speaks [place]. Interpreter, Kibrom, from [employer],is with him. His concerns today include:  Pt with hx of HL, chronic LBP, GERD  Burning in hands and feet: I went over blood test with him.  Thyroid level, vitamin B12 and folate level and diabetes screen were all good.  He reports some improvement in his symptoms with the gabapentin.  He states that he has to take it at night and not during the day because otherwise it makes him drowsy.  The prescription was written to take at bedtime.  GERD:  Doing well on Protonix.  Chronic LBP: Reports decrease in back pain symptoms with the tramadol.  Denies any significant side effects like drowsiness or constipation with the medication. Patient Active Problem List   Diagnosis Date Noted  . Abdominal pain 10/05/2017  . Acute midline thoracic back pain 09/15/2017  . Gastroesophageal reflux disease without esophagitis 09/15/2017  . Idiopathic peripheral neuropathy 09/15/2017  . Chronic lumbar radiculopathy 02/10/2017  . Hyperlipidemia 03/14/2016  . Lumbar degenerative disc disease 09/11/2015  . Chronic pain syndrome 09/11/2015  . 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 Former heavy cigarette smoker (20-39 per day) 03/17/2014  . Chronic low back pain      Current Outpatient Medications on File Prior to Visit  Medication Sig Dispense Refill  . atorvastatin (LIPITOR) 20 MG tablet Take 1 tablet (20 mg total) by mouth daily. 90 tablet prn  . gabapentin (NEURONTIN) 300 MG capsule Take 1 capsule (300 mg total) by mouth at bedtime. 30 capsule 3  . pantoprazole (PROTONIX) 20 MG tablet Take 1 tablet (20 mg total) by mouth daily. 30 tablet 4   No current facility-administered medications on file prior to visit.     No Known  Allergies  Social History   Socioeconomic History  . Marital status: Married    Spouse name: Not on file  . Number of children: 4  . Years of education: Not on file  . Highest education level: Not on file  Occupational History  . Occupation: Previous Optometrist     Comment: [place]   Social Needs  . Financial resource strain: Not on file  . Food insecurity:    Worry: Not on file    Inability: Not on file  . Transportation needs:    Medical: Not on file    Non-medical: Not on file  Tobacco Use  . Smoking status: Former Smoker    Packs/day: 0.75    Years: 25.00    Pack years: 18.75    Types: Cigarettes    Last attempt to quit: 09/19/2015    Years since quitting: 2.1  . Smokeless tobacco: Never Used  . Tobacco comment: 10 cigarettes a day   Substance and Sexual Activity  . Alcohol use: Yes    Alcohol/week: 0.0 standard drinks    Comment: social   . Drug use: No  . Sexual activity: Never  Lifestyle  . Physical activity:    Days per week: Not on file    Minutes per session: Not on file  . Stress: Not on file  Relationships  . Social connections:    Talks on phone: Not on file    Gets together: Not on file    Attends religious service:  Not on file    Active member of club or organization: Not on file    Attends meetings of clubs or organizations: Not on file    Relationship status: Not on file  . Intimate partner violence:    Fear of current or ex partner: Not on file    Emotionally abused: Not on file    Physically abused: Not on file    Forced sexual activity: Not on file  Other Topics Concern  . Not on file  Social History Narrative   Form [place]   Moved to [place] in 01/2014    Lives in [place] alone   Wife and 4 children in [place].     Family History  Problem Relation Age of Onset  . Cancer Mother        "cancer where they put baby", from description sounds uterine   . Heart disease Neg Hx   . Hypertension Neg Hx   . Diabetes Neg Hx   . Colon cancer Neg  Hx     Past Surgical History:  Procedure Laterality Date  . COLONOSCOPY N/A 05/15/2015   Procedure: COLONOSCOPY;  Surgeon: Danie Binder, MD;  [facility];  Service: Endoscopy;  Laterality: N/A;  145 - moved to 1:30 - office to notify  . None      ROS: Review of Systems Negative except as above. PHYSICAL EXAM: BP 121/75   Pulse 83   Temp 98.3 F (36.8 C) (Oral)   Resp 16   Wt 142 lb 3.2 oz (64.5 kg)   SpO2 95%   BMI 22.95 kg/m   Physical Exam  General appearance - alert, well appearing, and in no distress Mental status - normal mood, behavior, speech, dress, motor activity, and thought processes Neck - supple, no significant adenopathy Chest - clear to auscultation, no wheezes, rales or rhonchi, symmetric air entry Heart - normal rate, regular rhythm, normal S1, S2, no murmurs, rubs, clicks or gallops Extremities -no lower extremity edema.  ASSESSMENT AND PLAN: 1. Gastroesophageal reflux disease without esophagitis Symptoms controlled on PPI. - CBC  2. Idiopathic peripheral neuropathy Work-up for cause so far has been negative.  Will refer to neurology.  He will continue gabapentin for now. - CBC - Ambulatory referral to Neurology  3. Hyperlipidemia, unspecified hyperlipidemia type Continue statin. - Lipid panel - Comprehensive metabolic panel  4. Need for immunization against influenza Given today - Flu Vaccine QUAD 36+ mos IM  5. Chronic left-sided low back pain without sciatica Refill given on tramadol. Patient reports good relief of pain with the medication.  He reports a good functional level. - traMADol (ULTRAM) 50 MG tablet; Take 1 tablet (50 mg total) by mouth every 8 (eight) hours as needed for moderate pain.  Dispense: 60 tablet; Refill: 1  Patient was given the opportunity to ask questions.  Patient verbalized understanding of the plan and was able to repeat key elements of the plan.   Orders Placed This Encounter  Procedures  . Flu  Vaccine QUAD 36+ mos IM  . CBC  . Lipid panel  . Comprehensive metabolic panel  . Ambulatory referral to Neurology     Requested Prescriptions   Signed Prescriptions Disp Refills  . traMADol (ULTRAM) 50 MG tablet 60 tablet 1    Sig: Take 1 tablet (50 mg total) by mouth every 8 (eight) hours as needed for moderate pain.    Return in about 3 months (around 01/30/2018).  Karle Plumber, MD, FACP

## 2017-11-01 LAB — COMPREHENSIVE METABOLIC PANEL
A/G RATIO: 1.9 (ref 1.2–2.2)
ALT: 19 IU/L (ref 0–44)
AST: 20 IU/L (ref 0–40)
Albumin: 4.2 g/dL (ref 3.6–4.8)
Alkaline Phosphatase: 57 IU/L (ref 39–117)
BILIRUBIN TOTAL: 0.3 mg/dL (ref 0.0–1.2)
BUN/Creatinine Ratio: 15 (ref 10–24)
BUN: 10 mg/dL (ref 8–27)
CALCIUM: 9.3 mg/dL (ref 8.6–10.2)
CHLORIDE: 100 mmol/L (ref 96–106)
CO2: 23 mmol/L (ref 20–29)
Creatinine, Ser: 0.67 mg/dL — ABNORMAL LOW (ref 0.76–1.27)
GFR, EST AFRICAN AMERICAN: 121 mL/min/{1.73_m2} (ref >59)
GFR, EST NON AFRICAN AMERICAN: 104 mL/min/{1.73_m2} (ref >59)
GLOBULIN, TOTAL: 2.2 g/dL (ref 1.5–4.5)
Glucose: 84 mg/dL (ref 65–99)
POTASSIUM: 4.5 mmol/L (ref 3.5–5.2)
SODIUM: 138 mmol/L (ref 134–144)
TOTAL PROTEIN: 6.4 g/dL (ref 6.0–8.5)

## 2017-11-01 LAB — LIPID PANEL
CHOLESTEROL TOTAL: 130 mg/dL (ref 100–199)
Chol/HDL Ratio: 2.9 ratio (ref 0.0–5.0)
HDL: 45 mg/dL (ref >39)
LDL Calculated: 53 mg/dL (ref 0–99)
Triglycerides: 162 mg/dL — ABNORMAL HIGH (ref 0–149)
VLDL Cholesterol Cal: 32 mg/dL (ref 5–40)

## 2017-11-01 LAB — CBC
Hematocrit: 45.1 % (ref 37.5–51.0)
Hemoglobin: 15.4 g/dL (ref 13.0–17.7)
MCH: 32 pg (ref 26.6–33.0)
MCHC: 34.1 g/dL (ref 31.5–35.7)
MCV: 94 fL (ref 79–97)
PLATELETS: 239 10*3/uL (ref 150–450)
RBC: 4.81 x10E6/uL (ref 4.14–5.80)
RDW: 13.2 % (ref 12.3–15.4)
WBC: 6.9 10*3/uL (ref 3.4–10.8)

## 2017-11-08 MED FILL — PANTOPRAZOLE SOD DR 20 MG T: 20 | 30 days supply | Qty: 30 | Fill #0

## 2017-11-17 MED FILL — traMADol HCL 50 MG TABS: 50 | 7 days supply | Qty: 21 | Fill #1

## 2017-11-23 ENCOUNTER — Encounter: Payer: Medicaid Other | Attending: Physical Medicine & Rehabilitation

## 2017-11-23 ENCOUNTER — Encounter: Payer: Self-pay | Admitting: Physical Medicine & Rehabilitation

## 2017-11-23 ENCOUNTER — Ambulatory Visit: Payer: Medicaid Other | Admitting: Physical Medicine & Rehabilitation

## 2017-11-23 VITALS — BP 113/71 | HR 80 | Resp 14 | Ht 66.0 in | Wt 142.0 lb

## 2017-11-23 DIAGNOSIS — M5417 Radiculopathy, lumbosacral region: Secondary | ICD-10-CM | POA: Diagnosis not present

## 2017-11-23 DIAGNOSIS — M47816 Spondylosis without myelopathy or radiculopathy, lumbar region: Secondary | ICD-10-CM | POA: Diagnosis not present

## 2017-11-23 NOTE — Progress Notes (Signed)
Left Lumbar L3, L4  medial branch blocks and L 5 dorsal ramus injection under fluoroscopic guidance   Indication: Left Lumbar pain which is not relieved by medication management or other conservative care and interfering with self-care and mobility.  Informed consent was obtained after describing risks and benefits of the procedure with the patient, this includes bleeding, bruising, infection, paralysis and medication side effects.  The patient wishes to proceed and has given written consent.  The patient was placed in a prone position.  The lumbar area was marked and prepped with Betadine.  One mL of 1% lidocaine was injected into each of 3 areas into the skin and subcutaneous tissue.  Then a 22-gauge 3.5in spinal needle was inserted targeting the junction of the left S1 superior articular process and sacral ala junction.  Needle was advanced under fluoroscopic guidance.  Bone contact was made. Isovue 200 was injected x 0.5 mL demonstrating no intravascular uptake.  Then a solution containing  2% MPF lidocaine was injected x 0.5 mL.  Then the left L5 superior articular process in transverse process junction was targeted.  Bone contact was made. Isovue 200 was injected x 0.5 mL demonstrating no intravascular uptake.  Then a solution containing 2% MPF lidocaine was injected x 0.5 mL.  Then the left L4 superior articular process in transverse process junction was targeted.  Bone contact was made.  Isovue 200 was injected x 0.5 mL demonstrating no intravascular uptake.  Then a solution containing  2% MPF lidocaine was injected x 0.5 mL.  Patient tolerated procedure well.  Post procedure instructions were given. 

## 2017-11-23 NOTE — Progress Notes (Signed)
  PROCEDURE RECORD [facility]   Name: Nathaniel Robinson DOB:June 06, 1955 MRN: [id number]  Date:11/23/2017  Physician: Alysia Penna, MD    Nurse/CMA: Bright, CMA  Allergies: No Known Allergies  Consent Signed: Yes.    Is patient diabetic? No.  CBG today? N/a   Pregnant: No. LMP: No LMP for male patient. (age 61-55)  Anticoagulants: no Anti-inflammatory: no Antibiotics: no  Procedure:  Left L3,4,5 medial branch block Position: Prone   Start Time: 1100am End Time: 1108am Fluoro Time: 25s  RN/CMA Luvenia Cranford, CMA Bright, CMA    Time 10:37am 1113am    BP 113/71 126/82    Pulse 80 68    Respirations 16 16    O2 Sat 98 98    S/S 6 6    Pain Level 6/10 5/10     D/C home with public transportation, patient A & O X 3, D/C instructions reviewed, and sits independently.

## 2017-11-23 NOTE — Patient Instructions (Signed)

## 2017-12-19 MED FILL — PANTOPRAZOLE SOD DR 20 MG T: 20 | 30 days supply | Qty: 30 | Fill #1

## 2017-12-19 MED FILL — traMADol HCL 50 MG TABS: 50 | 7 days supply | Qty: 21 | Fill #2

## 2018-01-02 ENCOUNTER — Ambulatory Visit: Payer: Medicaid Other | Admitting: Neurology

## 2018-01-02 ENCOUNTER — Encounter: Payer: Self-pay | Admitting: Neurology

## 2018-01-02 DIAGNOSIS — R202 Paresthesia of skin: Secondary | ICD-10-CM | POA: Diagnosis not present

## 2018-01-02 NOTE — Progress Notes (Signed)
PATIENT: Nathaniel Robinson DOB: 1955/07/17  Chief Complaint  Patient presents with  . Peripheral Neuropathy    He is here with an interpreter from [place] (from [place]).  Reports intermittent burning sensations in his bilateral hands.  He will sometimes have symptoms in his feet.  He has previously tried gabapentin 300mg  at bedtime.  He stopped it due to having increased stress and nightmares.   Marland Kitchen PCP    Ladell Pier, MD     HISTORICAL  Nathaniel Robinson is a 62 year old male, seen in request by his primary care physician Dr. Wynetta Emery, Neoma Laming B for evaluation of peripheral neuropathy, he is a native of [place], is accompanied by interpreter at today's visit on January 02, 2018.  I have reviewed and summarized the referring note from the referring physician.  He had past medical history of hyperlipidemia. He moved from [place] to [place] in 2015.  He began to noticed gradual onset bilateral feet numbness tingling since 2018, starting at the palm of his hand, and bilateral plantar feet simultaneously, he described burning sensation, intermittent, no limitation in his daily function, such as using his hand, or walking, he also has acid reflux, complains of difficulty sleeping at nighttime, is recently treated with Protonix, has much improved, he was given gabapentin for symptomatic control, complains of non-refreshing sleep, nightmares  I personally reviewed laboratory evaluation in September 2019: Normal CMP, creatinine of 0.67, slight elevated triglyceride 162, normal LDL 53, normal CBC, hemoglobin of 15.4, A1c of 5.6, normal folic acid, F75, TSH,   REVIEW OF SYSTEMS: Full 14 system review of systems performed and notable only for as above  All other review of systems were negative.  ALLERGIES: No Known Allergies  HOME MEDICATIONS: Current Outpatient Medications  Medication Sig Dispense Refill  . atorvastatin (LIPITOR) 20 MG tablet Take 1 tablet (20 mg total) by mouth daily.  90 tablet prn  . pantoprazole (PROTONIX) 20 MG tablet Take 1 tablet (20 mg total) by mouth daily. 30 tablet 4  . traMADol (ULTRAM) 50 MG tablet Take 1 tablet (50 mg total) by mouth every 8 (eight) hours as needed for moderate pain. 60 tablet 1   No current facility-administered medications for this visit.     PAST MEDICAL HISTORY: Past Medical History:  Diagnosis Date  . Chronic low back pain 2010   . GERD (gastroesophageal reflux disease)     PAST SURGICAL HISTORY: Past Surgical History:  Procedure Laterality Date  . COLONOSCOPY N/A 05/15/2015   Procedure: COLONOSCOPY;  Surgeon: Danie Binder, MD;  [facility];  Service: Endoscopy;  Laterality: N/A;  145 - moved to 1:30 - office to notify    FAMILY HISTORY: Family History  Problem Relation Age of Onset  . Ovarian cancer Mother   . Other Father        unsure of medical history  . Heart disease Neg Hx   . Hypertension Neg Hx   . Diabetes Neg Hx   . Colon cancer Neg Hx     SOCIAL HISTORY: Social History   Socioeconomic History  . Marital status: Married    Spouse name: Not on file  . Number of children: 4  . Years of education: come college  . Highest education level: Not on file  Occupational History  . Occupation: disability    Comment: [place]   Social Needs  . Financial resource strain: Not on file  . Food insecurity:    Worry: Not on file  Inability: Not on file  . Transportation needs:    Medical: Not on file    Non-medical: Not on file  Tobacco Use  . Smoking status: Current Every Day Smoker    Packs/day: 0.75    Years: 25.00    Pack years: 18.75    Types: Cigarettes  . Smokeless tobacco: Never Used  . Tobacco comment: 10 cigarettes a day   Substance and Sexual Activity  . Alcohol use: Yes    Alcohol/week: 0.0 standard drinks    Comment: social   . Drug use: No  . Sexual activity: Never  Lifestyle  . Physical activity:    Days per week: Not on file    Minutes per session: Not  on file  . Stress: Not on file  Relationships  . Social connections:    Talks on phone: Not on file    Gets together: Not on file    Attends religious service: Not on file    Active member of club or organization: Not on file    Attends meetings of clubs or organizations: Not on file    Relationship status: Not on file  . Intimate partner violence:    Fear of current or ex partner: Not on file    Emotionally abused: Not on file    Physically abused: Not on file    Forced sexual activity: Not on file  Other Topics Concern  . Not on file  Social History Narrative   Form [place]   Moved to [place] in 01/2014    Lives with roommate.   Right-handed.   No caffeine use.   Wife and 4 children in [place].      PHYSICAL EXAM   Vitals:   01/02/18 1124  BP: 122/82  Pulse: 60  Weight: 146 lb (66.2 kg)  Height: 5\' 6"  (1.676 m)    Not recorded      Body mass index is 23.57 kg/m.  PHYSICAL EXAMNIATION:  Gen: NAD, conversant, well nourised, obese, well groomed                     Cardiovascular: Regular rate rhythm, no peripheral edema, warm, nontender. Eyes: Conjunctivae clear without exudates or hemorrhage Neck: Supple, no carotid bruits. Pulmonary: Clear to auscultation bilaterally   NEUROLOGICAL EXAM:  MENTAL STATUS: Speech:    Speech is normal; fluent and spontaneous with normal comprehension.  Cognition:     Orientation to time, place and person     Normal recent and remote memory     Normal Attention span and concentration     Normal Language, naming, repeating,spontaneous speech     Fund of knowledge   CRANIAL NERVES: CN II: Visual fields are full to confrontation. Fundoscopic exam is normal with sharp discs and no vascular changes. Pupils are round equal and briskly reactive to light. CN III, IV, VI: extraocular movement are normal. No ptosis. CN V: Facial sensation is intact to pinprick in all 3 divisions bilaterally. Corneal responses are intact.  CN VII:  Face is symmetric with normal eye closure and smile. CN VIII: Hearing is normal to rubbing fingers CN IX, X: Palate elevates symmetrically. Phonation is normal. CN XI: Head turning and shoulder shrug are intact CN XII: Tongue is midline with normal movements and no atrophy.  MOTOR: There is no pronator drift of out-stretched arms. Muscle bulk and tone are normal. Muscle strength is normal.  REFLEXES: Reflexes are 2+ and symmetric at the biceps, triceps, knees, and ankles.  Plantar responses are flexor.  SENSORY: Intact to light touch, pinprick, positional sensation and vibratory sensation are intact in fingers and toes.  COORDINATION: Rapid alternating movements and fine finger movements are intact. There is no dysmetria on finger-to-nose and heel-knee-shin.    GAIT/STANCE: Posture is normal. Gait is steady with normal steps, base, arm swing, and turning. Heel and toe walking are normal. Tandem gait is normal.  Romberg is absent.   DIAGNOSTIC DATA (LABS, IMAGING, TESTING) - I reviewed patient records, labs, notes, testing and imaging myself where available.   ASSESSMENT AND PLAN  Jadrian Bulman is a 62 y.o. male   Bilateral feet and hands paresthesia  Essentially normal neurological examination,  No evidence of large fiber peripheral neuropathy, the onset patent and intermittent features is not typical for peripheral neuropathy.  Complete laboratory evaluations, including inflammatory markers, infectious etiology.  He does not want any symptomatic treatment.   Marcial Pacas, M.D. Ph.D.  [facility] [address], [place], [place] [address] Ph: [phone] Fax: [phone]  CC: Ladell Pier, MD

## 2018-01-04 ENCOUNTER — Ambulatory Visit: Payer: Medicaid Other | Admitting: Physical Medicine & Rehabilitation

## 2018-01-04 ENCOUNTER — Encounter: Payer: Medicaid Other | Attending: Physical Medicine & Rehabilitation

## 2018-01-04 ENCOUNTER — Encounter: Payer: Self-pay | Admitting: Physical Medicine & Rehabilitation

## 2018-01-04 VITALS — BP 126/82 | HR 80 | Ht 66.0 in | Wt 141.0 lb

## 2018-01-04 DIAGNOSIS — M5417 Radiculopathy, lumbosacral region: Secondary | ICD-10-CM | POA: Insufficient documentation

## 2018-01-04 DIAGNOSIS — M47816 Spondylosis without myelopathy or radiculopathy, lumbar region: Secondary | ICD-10-CM | POA: Diagnosis not present

## 2018-01-04 LAB — PROTEIN ELECTROPHORESIS
A/G Ratio: 1.3 (ref 0.7–1.7)
ALBUMIN ELP: 3.7 g/dL (ref 2.9–4.4)
ALPHA 1: 0.2 g/dL (ref 0.0–0.4)
Alpha 2: 0.8 g/dL (ref 0.4–1.0)
Beta: 1.1 g/dL (ref 0.7–1.3)
GAMMA GLOBULIN: 0.8 g/dL (ref 0.4–1.8)
Globulin, Total: 2.9 g/dL (ref 2.2–3.9)
TOTAL PROTEIN: 6.6 g/dL (ref 6.0–8.5)

## 2018-01-04 LAB — RPR: RPR Ser Ql: NONREACTIVE

## 2018-01-04 LAB — ANA W/REFLEX IF POSITIVE: ANA: NEGATIVE

## 2018-01-04 LAB — C-REACTIVE PROTEIN: CRP: 1 mg/L (ref 0–10)

## 2018-01-04 LAB — HIV ANTIBODY (ROUTINE TESTING W REFLEX): HIV SCREEN 4TH GENERATION: NONREACTIVE

## 2018-01-04 LAB — SEDIMENTATION RATE: SED RATE: 15 mm/h (ref 0–30)

## 2018-01-04 NOTE — Progress Notes (Signed)
Subjective:  Here with [place] interpreter  Patient ID: Nathaniel Robinson, male    DOB: Aug 09, 1955, 62 y.o.   MRN: [id number] 62 year old [place] man with history of low back pain x5 years.  He has had sacroiliac injections without much relief.  Initially medial branch blocks were not helpful but has undergone medial branch blocks both on the right side and left side at L3-L4-L5 and these are now helpful for relieving his axial back pain Left Lumbar L3, L4  medial branch blocks and L 5 dorsal ramus injection under fluoroscopic guidance - left side low back pain improved  The patient has also had relief with left L4 and left L5 transforaminal epidural injections last performed 02/10/2017   HPI Last L3-4 Medial branch block and L5 dorsal ramus injection performed Nov 23, 2017-and 08/22/2017 Pain is relieved for ~47months, ~50% relief then pain returns  Pt has had minimal relief with PT and medications  Occ pain on the right side  Pain interferes with lifting objects like grocery bags  No pain traveling from back to legs  Pain Inventory Average Pain 6 Pain Right Now 5 My pain is burning  In the last 24 hours, has pain interfered with the following? General activity 5 Relation with others 5 Enjoyment of life 5 What TIME of day is your pain at its worst? daytime Sleep (in general) Fair  Pain is worse with: walking, bending, standing and some activites Pain improves with: rest, medication and injections Relief from Meds: 5  Mobility walk without assistance do you drive?  no  Function disabled: date disabled 2016  Neuro/Psych No problems in this area  Prior Studies Any changes since last visit?  no  Physicians involved in your care Any changes since last visit?  no   Family History  Problem Relation Age of Onset  . Ovarian cancer Mother   . Other Father        unsure of medical history  . Heart disease Neg Hx   . Hypertension Neg Hx   . Diabetes Neg Hx   . Colon  cancer Neg Hx    Social History   Socioeconomic History  . Marital status: Married    Spouse name: Not on file  . Number of children: 4  . Years of education: come college  . Highest education level: Not on file  Occupational History  . Occupation: disability    Comment: [place]   Social Needs  . Financial resource strain: Not on file  . Food insecurity:    Worry: Not on file    Inability: Not on file  . Transportation needs:    Medical: Not on file    Non-medical: Not on file  Tobacco Use  . Smoking status: Current Every Day Smoker    Packs/day: 0.75    Years: 25.00    Pack years: 18.75    Types: Cigarettes  . Smokeless tobacco: Never Used  . Tobacco comment: 10 cigarettes a day   Substance and Sexual Activity  . Alcohol use: Yes    Alcohol/week: 0.0 standard drinks    Comment: social   . Drug use: No  . Sexual activity: Never  Lifestyle  . Physical activity:    Days per week: Not on file    Minutes per session: Not on file  . Stress: Not on file  Relationships  . Social connections:    Talks on phone: Not on file    Gets together: Not on file  Attends religious service: Not on file    Active member of club or organization: Not on file    Attends meetings of clubs or organizations: Not on file    Relationship status: Not on file  Other Topics Concern  . Not on file  Social History Narrative   Form [place]   Moved to [place] in 01/2014    Lives with roommate.   Right-handed.   No caffeine use.   Wife and 4 children in [place].    Past Surgical History:  Procedure Laterality Date  . COLONOSCOPY N/A 05/15/2015   Procedure: COLONOSCOPY;  Surgeon: Danie Binder, MD;  [facility];  Service: Endoscopy;  Laterality: N/A;  145 - moved to 1:30 - office to notify   Past Medical History:  Diagnosis Date  . Chronic low back pain 2010   . GERD (gastroesophageal reflux disease)    There were no vitals taken for this visit.  Opioid Risk Score:     Fall Risk Score:  `1  Depression screen PHQ 2/9  Depression screen [facility] 2/9 10/31/2017 09/15/2017 07/04/2017 06/15/2017 04/24/2017 02/02/2017 12/05/2016  Decreased Interest 0 0 0 0 0 0 0  Down, Depressed, Hopeless 0 0 0 0 0 0 0  PHQ - 2 Score 0 0 0 0 0 0 0  Altered sleeping - - - - - - -  Tired, decreased energy - - - - - - -  Change in appetite - - - - - - -  Feeling bad or failure about yourself  - - - - - - -  Trouble concentrating - - - - - - -  Moving slowly or fidgety/restless - - - - - - -  Suicidal thoughts - - - - - - -  PHQ-9 Score - - - - - - -     Review of Systems  Constitutional: Negative.   HENT: Negative.   Eyes: Negative.   Respiratory: Negative.   Cardiovascular: Negative.   Gastrointestinal: Negative.   Endocrine: Negative.   Genitourinary: Negative.   Musculoskeletal: Positive for arthralgias and back pain.  Skin: Negative.   Allergic/Immunologic: Negative.   Neurological: Negative.   Hematological: Negative.   Psychiatric/Behavioral: Negative.        Objective:   Physical Exam  Constitutional: He is oriented to person, place, and time. He appears well-developed and well-nourished. No distress.  HENT:  Head: Normocephalic and atraumatic.  Neurological: He is alert and oriented to person, place, and time.  Skin: He is not diaphoretic.  Nursing note and vitals reviewed.  Negative straight leg raising test Motor strength is 5/5 bilateral hip flexors knee extensors ankle dorsiflexors Deep tendon reflexes 2+ bilateral patellar bilateral Achilles Hip knee and ankle range of motion are normal There is no lower extremity swelling There is mild tenderness to palpation in the lumbar paraspinal musculature on the left side but not on the right side. Lumbar range of motion is 75% flexion extension lateral bending and rotation. Has pain when bending toward the left side as well as forward      Assessment & Plan:  1.  Lumbar spondylosis without myelopathy he  has primarily left-sided lumbar pain which is exacerbated by lifting up objects on the left side and bending toward the left He has had 50% relief of pain in the low back on 2 occasions after medial branch blocks at L3-L4 and dorsal ramus injection L5 on the left side.  These were performed in July and  October 2019.  His initial pain reduction right after injection was lower than what he ultimately achieved. With the help of interpreter we discussed the process and procedure of the lumbar radiofrequency neurotomy.  We discussed that in addition to the complications commonly seen with medial branch blocks he may also experience some increased postprocedure pain as well as numbness over the back. We discussed usual duration of at least 6 months. Answered questions Time was spent clarifying the need and procedure with the patient using the interpreter. Over half of the 25 min visit was spent counseling and coordinating care.

## 2018-01-05 ENCOUNTER — Telehealth: Payer: Self-pay | Admitting: *Deleted

## 2018-01-05 NOTE — Telephone Encounter (Signed)
-----   Message from Marcial Pacas, MD sent at 01/05/2018 10:50 AM EST ----- Please call patient for normal laboratory result

## 2018-01-05 NOTE — Telephone Encounter (Signed)
Called [employer] [employer] and spoke with Sophia. She transferred me to [place] interpreter. [id number]. They called pt. Relayed labwork normal per Dr. Krista Blue. Pt verbalized understanding.

## 2018-01-11 ENCOUNTER — Encounter (HOSPITAL_COMMUNITY): Payer: Self-pay | Admitting: Emergency Medicine

## 2018-01-11 ENCOUNTER — Emergency Department (HOSPITAL_COMMUNITY)
Admission: EM | Admit: 2018-01-11 | Discharge: 2018-01-11 | Disposition: A | Discharge status: Home / Self Care | Payer: Medicaid Other | Attending: Emergency Medicine | Admitting: Emergency Medicine

## 2018-01-11 ENCOUNTER — Other Ambulatory Visit: Payer: Self-pay

## 2018-01-11 ENCOUNTER — Emergency Department (HOSPITAL_COMMUNITY): Payer: Medicaid Other

## 2018-01-11 DIAGNOSIS — Z79899 Other long term (current) drug therapy: Secondary | ICD-10-CM | POA: Insufficient documentation

## 2018-01-11 DIAGNOSIS — R0602 Shortness of breath: Secondary | ICD-10-CM | POA: Diagnosis not present

## 2018-01-11 DIAGNOSIS — R079 Chest pain, unspecified: Secondary | ICD-10-CM | POA: Diagnosis not present

## 2018-01-11 DIAGNOSIS — J209 Acute bronchitis, unspecified: Secondary | ICD-10-CM | POA: Diagnosis not present

## 2018-01-11 DIAGNOSIS — F17210 Nicotine dependence, cigarettes, uncomplicated: Secondary | ICD-10-CM | POA: Insufficient documentation

## 2018-01-11 DIAGNOSIS — R05 Cough: Secondary | ICD-10-CM | POA: Diagnosis not present

## 2018-01-11 DIAGNOSIS — R000 Tachycardia, unspecified: Secondary | ICD-10-CM | POA: Diagnosis not present

## 2018-01-11 LAB — CBC
HEMATOCRIT: 48.2 % (ref 39.0–52.0)
Hemoglobin: 15.8 g/dL (ref 13.0–17.0)
MCH: 31.5 pg (ref 26.0–34.0)
MCHC: 32.8 g/dL (ref 30.0–36.0)
MCV: 96.2 fL (ref 80.0–100.0)
NRBC: 0 % (ref 0.0–0.2)
Platelets: 260 10*3/uL (ref 150–400)
RBC: 5.01 MIL/uL (ref 4.22–5.81)
RDW: 13.2 % (ref 11.5–15.5)
WBC: 7.7 10*3/uL (ref 4.0–10.5)

## 2018-01-11 LAB — BASIC METABOLIC PANEL
ANION GAP: 10 (ref 5–15)
BUN: 10 mg/dL (ref 8–23)
CO2: 22 mmol/L (ref 22–32)
Calcium: 9.1 mg/dL (ref 8.9–10.3)
Chloride: 104 mmol/L (ref 98–111)
Creatinine, Ser: 0.71 mg/dL (ref 0.61–1.24)
GFR calc Af Amer: >60 mL/min (ref >60)
GFR calc non Af Amer: >60 mL/min (ref >60)
Glucose, Bld: 102 mg/dL — ABNORMAL HIGH (ref 70–99)
POTASSIUM: 4.2 mmol/L (ref 3.5–5.1)
Sodium: 136 mmol/L (ref 135–145)

## 2018-01-11 MED ORDER — AEROCHAMBER PLUS FLO-VU MEDIUM MISC
1.0000 | Freq: Once | Status: AC
  Administered 2018-01-11: 1
  Filled 2018-01-11: qty 1

## 2018-01-11 MED ORDER — ALBUTEROL SULFATE (2.5 MG/3ML) 0.083% IN NEBU
5.0000 mg | INHALATION_SOLUTION | Freq: Once | RESPIRATORY_TRACT | Status: AC
  Administered 2018-01-11: 5 mg via RESPIRATORY_TRACT
  Filled 2018-01-11: qty 6

## 2018-01-11 MED ORDER — PREDNISONE 10 MG PO TABS: 20.0000 mg | ORAL_TABLET | Freq: Every day | ORAL | 0 refills | Status: DC

## 2018-01-11 MED ORDER — ALBUTEROL SULFATE HFA 108 (90 BASE) MCG/ACT IN AERS
2.0000 | INHALATION_SPRAY | RESPIRATORY_TRACT | Status: DC | PRN
  Administered 2018-01-11: 2 via RESPIRATORY_TRACT
  Filled 2018-01-11: qty 6.7

## 2018-01-11 MED ORDER — PREDNISONE 20 MG PO TABS
40.0000 mg | ORAL_TABLET | Freq: Once | ORAL | Status: AC
  Administered 2018-01-11: 40 mg via ORAL
  Filled 2018-01-11: qty 2

## 2018-01-11 NOTE — ED Notes (Signed)
Discharge instructions and prescriptions discussed with Pt. Pt verbalized understanding. Pt stable and ambulatory.   

## 2018-01-11 NOTE — ED Provider Notes (Addendum)
[facility] EMERGENCY DEPARTMENT Provider Note   CSN: [id number] Arrival date & time: 01/11/18  1239     History   Chief Complaint Chief Complaint  Patient presents with  . Chest Pain    HPI Ahaan Zobrist is a 62 y.o. male.  HPI  Level 5 caveat secondary to language barrier History obtained through interpretor  62 yo male presents today complaining of one-week history of cough productive of discolored sputum with subjective fever and chills.  He thought that he had the flu.  He states he did have a flu shot.  He is a smoker.  He states that today he had an episode where he felt like he was choking and he was dyspneic for several minutes.  He proceeded to come to the emergency department secondary to this.  The symptoms have resolved with coughing.  Past Medical History:  Diagnosis Date  . Chronic low back pain 2010   . GERD (gastroesophageal reflux disease)     Patient Active Problem List   Diagnosis Date Noted  . Paresthesia 01/02/2018  . Acute midline thoracic back pain 09/15/2017  . Gastroesophageal reflux disease without esophagitis 09/15/2017  . Idiopathic peripheral neuropathy 09/15/2017  . Chronic lumbar radiculopathy 02/10/2017  . Hyperlipidemia 03/14/2016  . Lumbar degenerative disc disease 09/11/2015  . Chronic pain syndrome 09/11/2015  . 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 Former heavy cigarette smoker (20-39 per day) 03/17/2014  . Chronic low back pain     Past Surgical History:  Procedure Laterality Date  . COLONOSCOPY N/A 05/15/2015   Procedure: COLONOSCOPY;  Surgeon: Danie Binder, MD;  [facility];  Service: Endoscopy;  Laterality: N/A;  145 - moved to 1:30 - office to notify        Home Medications    Prior to Admission medications   Medication Sig Start Date End Date Taking? Authorizing Provider  atorvastatin (LIPITOR) 20 MG tablet Take 1 tablet (20 mg total) by mouth daily.  09/15/17  Yes Ladell Pier, MD  pantoprazole (PROTONIX) 20 MG tablet Take 1 tablet (20 mg total) by mouth daily. 09/15/17  Yes Ladell Pier, MD  traMADol (ULTRAM) 50 MG tablet Take 1 tablet (50 mg total) by mouth every 8 (eight) hours as needed for moderate pain. 10/31/17  Yes Ladell Pier, MD    Family History Family History  Problem Relation Age of Onset  . Ovarian cancer Mother   . Other Father        unsure of medical history  . Heart disease Neg Hx   . Hypertension Neg Hx   . Diabetes Neg Hx   . Colon cancer Neg Hx     Social History Social History   Tobacco Use  . Smoking status: Current Every Day Smoker    Packs/day: 0.75    Years: 25.00    Pack years: 18.75    Types: Cigarettes  . Smokeless tobacco: Never Used  . Tobacco comment: 10 cigarettes a day   Substance Use Topics  . Alcohol use: Yes    Alcohol/week: 0.0 standard drinks    Comment: social   . Drug use: No     Allergies   Patient has no known allergies.   Review of Systems Review of Systems  All other systems reviewed and are negative.    Physical Exam Updated Vital Signs BP 114/70   Pulse 85   Temp 99.2 F (37.3 C) (Oral)  Resp (!) 23   SpO2 99%   Physical Exam  Constitutional: He is oriented to person, place, and time. He appears well-developed and well-nourished.  HENT:  Head: Normocephalic and atraumatic.  Right Ear: External ear normal.  Left Ear: External ear normal.  Nose: Nose normal.  Mouth/Throat: Oropharynx is clear and moist.  Eyes: Pupils are equal, round, and reactive to light. Conjunctivae and EOM are normal.  Neck: Normal range of motion. Neck supple.  Cardiovascular: Normal rate, regular rhythm, normal heart sounds and intact distal pulses.  Pulmonary/Chest: Effort normal. No respiratory distress. He has decreased breath sounds. He has wheezes in the right lower field and the left lower field. He exhibits no tenderness.  Abdominal: Soft. Bowel sounds are  normal. He exhibits no distension and no mass. There is no tenderness. There is no guarding.  Musculoskeletal: Normal range of motion.  Neurological: He is alert and oriented to person, place, and time. He has normal reflexes. He exhibits normal muscle tone. Coordination normal.  Skin: Skin is warm and dry.  Psychiatric: He has a normal mood and affect. His behavior is normal. Judgment and thought content normal.  Nursing note and vitals reviewed.    ED Treatments / Results  Labs (all labs ordered are listed, but only abnormal results are displayed) Labs Reviewed  BASIC METABOLIC PANEL - Abnormal; Notable for the following components:      Result Value   Glucose, Bld 102 (*)    All other components within normal limits  CBC    EKG EKG Interpretation  Date/Time:  Thursday January 11 2018 12:50:10 EST Ventricular Rate:  107 PR Interval:    QRS Duration: 81 QT Interval:  317 QTC Calculation: 423 R Axis:   35 Text Interpretation:  Sinus tachycardia Confirmed by Pattricia Boss [phone]) on 01/11/2018 1:40:53 PM   Radiology Dg Chest Port 1 View  Result Date: 01/11/2018 CLINICAL DATA:  Cough and shortness of breath EXAM: PORTABLE CHEST 1 VIEW COMPARISON:  November 04, 2015 FINDINGS: The heart size and mediastinal contours are within normal limits. Both lungs are clear. The visualized skeletal structures are unremarkable. IMPRESSION: No active disease. Electronically Signed   By: Dorise Bullion III M.D   On: 01/11/2018 13:25    Procedures Procedures (including critical care time)  Medications Ordered in ED Medications  albuterol (PROVENTIL) (2.5 MG/3ML) 0.083% nebulizer solution 5 mg (5 mg Nebulization Given 01/11/18 1325)     Initial Impression / Assessment and Plan / ED Course  I have reviewed the triage vital signs and the nursing notes.  Pertinent labs & imaging results that were available during my care of the patient were reviewed by me and considered in my medical  decision making (see chart for details).     Treated here with albuterol with increased air movement.  And hemodynamically stable with good oxygen saturations noted on monitor  Final Clinical Impressions(s) / ED Diagnoses   Final diagnoses:  Bronchitis, acute, with bronchospasm    ED Discharge Orders    None       Pattricia Boss, MD 01/12/18 [place]    Pattricia Boss, MD 01/12/18 1254

## 2018-01-11 NOTE — ED Triage Notes (Signed)
Pt from home c/o cold/flu like symtpoms  x 10 days, coughing and SOB.   A&O x4.

## 2018-01-11 NOTE — Discharge Instructions (Addendum)
Stop smoking Use inhaler as shown every 4 hours REturn if you are worse at any time, especially return of shortness of breath.

## 2018-01-11 NOTE — ED Notes (Signed)
X-ray at bedside

## 2018-01-15 MED FILL — predniSONE 10 MG TABS: 10 | 2 days supply | Qty: 5 | Fill #0

## 2018-01-19 ENCOUNTER — Encounter: Payer: Self-pay | Admitting: Physical Medicine & Rehabilitation

## 2018-01-19 ENCOUNTER — Ambulatory Visit: Payer: Medicaid Other | Admitting: Physical Medicine & Rehabilitation

## 2018-01-19 ENCOUNTER — Encounter: Payer: Medicaid Other | Attending: Physical Medicine & Rehabilitation

## 2018-01-19 VITALS — BP 118/76 | HR 71 | Ht 66.0 in | Wt 141.0 lb

## 2018-01-19 DIAGNOSIS — M47816 Spondylosis without myelopathy or radiculopathy, lumbar region: Secondary | ICD-10-CM

## 2018-01-19 DIAGNOSIS — M5417 Radiculopathy, lumbosacral region: Secondary | ICD-10-CM | POA: Insufficient documentation

## 2018-01-19 NOTE — Progress Notes (Signed)
Left L5 dorsal ramus., left L4 and left L3 medial branch radio frequency neurotomy under fluoroscopic guidance  Indication: Low back pain due to lumbar spondylosis which has been relieved on 2 occasions by greater than 50% by lumbar medial branch blocks at corresponding levels.  Informed consent was obtained after describing risks and benefits of the procedure with the patient, this includes bleeding, bruising, infection, paralysis and medication side effects. The patient wishes to proceed and has given written consent. The patient was placed in a prone position. The lumbar and sacral area was marked and prepped with Betadine. A 25-gauge 1-1/2 inch needle was inserted into the skin and subcutaneous tissue at 3 sites in one ML of 1% lidocaine was injected into each site. Then a 18-gauge 10 cm radio frequency needle with a 1 cm curved active tip was inserted targeting the left S1 SAP/sacral ala junction. Bone contact was made and confirmed with lateral imaging.  motor stimulation at 2 Hz confirm proper needle location followed by injection of one ML of 1% MPF lidocaine. Then the left L5 SAP/transverse process junction was targeted. Bone contact was made and confirmed with lateral imaging motor stimulation at 2 Hz confirm proper needle location followed by injection of one ML of the solution containing one ML of  1% MPF lidocaine. Then the left L4 SAP/transverse process junction was targeted. Bone contact was made and confirmed with lateral imaging. motor stimulation at 2 Hz confirm proper needle location followed by injection of one ML of the solution containing one ML of1% MPF lidocaine. Radio frequency lesion  at 80C for 90 seconds was performed. Needles were removed. Post procedure instructions and vital signs were performed. Patient tolerated procedure well. Followup appointment was given.  

## 2018-01-19 NOTE — Patient Instructions (Signed)
You had a radio frequency procedure today This was done to alleviate joint pain in your lumbar area We injected lidocaine which is a local anesthetic.  You may experience soreness at the injection sites. You may also experienced some irritation of the nerves that were heated I'm recommending ice for 30 minutes every 2 hours as needed for the next 24-48 hours   

## 2018-01-19 NOTE — Progress Notes (Signed)
  PROCEDURE RECORD [facility]   Name: Nathaniel Robinson DOB:11-Mar-1955 MRN: [id number]  Date:01/19/2018  Physician: Alysia Penna, MD    Nurse/CMA: Nylee Barbuto, CMA   Allergies: No Known Allergies  Consent Signed: Yes.    Is patient diabetic? No.  CBG today?   Pregnant: No. LMP: No LMP for male patient. (age 61-55)  Anticoagulants: no Anti-inflammatory: no Antibiotics: no  Procedure: left L3,4,5  Position: Prone Start Time: 12:08pm  End Time: 12:27pm  Fluoro Time: 37s  RN/CMA Varnell Donate, CMA Cris Talavera, CMA    Time 12:00pm 12:36pm    BP 118/76 118/76    Pulse 70 72    Respirations 14 14    O2 Sat 97 97    S/S 6 6    Pain Level 5/10 4/10     D/C home with public transportation, patient A & O X 3, D/C instructions reviewed, and sits independently.

## 2018-01-29 MED FILL — ATORVASTATIN 20 MG TABLET: 20 | 30 days supply | Qty: 30 | Fill #2

## 2018-01-30 ENCOUNTER — Ambulatory Visit: Payer: Medicaid Other | Attending: Internal Medicine | Admitting: Internal Medicine

## 2018-01-30 ENCOUNTER — Encounter: Payer: Self-pay | Admitting: Internal Medicine

## 2018-01-30 VITALS — BP 133/77 | HR 76 | Temp 98.4°F | Resp 16 | Wt 148.2 lb

## 2018-01-30 DIAGNOSIS — K219 Gastro-esophageal reflux disease without esophagitis: Secondary | ICD-10-CM | POA: Insufficient documentation

## 2018-01-30 DIAGNOSIS — F17210 Nicotine dependence, cigarettes, uncomplicated: Secondary | ICD-10-CM | POA: Insufficient documentation

## 2018-01-30 DIAGNOSIS — G894 Chronic pain syndrome: Secondary | ICD-10-CM | POA: Diagnosis not present

## 2018-01-30 DIAGNOSIS — G609 Hereditary and idiopathic neuropathy, unspecified: Secondary | ICD-10-CM | POA: Insufficient documentation

## 2018-01-30 DIAGNOSIS — E785 Hyperlipidemia, unspecified: Secondary | ICD-10-CM | POA: Insufficient documentation

## 2018-01-30 DIAGNOSIS — L290 Pruritus ani: Secondary | ICD-10-CM

## 2018-01-30 DIAGNOSIS — M5116 Intervertebral disc disorders with radiculopathy, lumbar region: Secondary | ICD-10-CM | POA: Insufficient documentation

## 2018-01-30 DIAGNOSIS — G8929 Other chronic pain: Secondary | ICD-10-CM | POA: Diagnosis not present

## 2018-01-30 DIAGNOSIS — M545 Low back pain, unspecified: Secondary | ICD-10-CM

## 2018-01-30 MED ORDER — HYDROCORTISONE VALERATE 0.2 % EX CREA: TOPICAL_CREAM | CUTANEOUS | 0 refills | Status: DC

## 2018-01-30 MED ORDER — PANTOPRAZOLE SODIUM 20 MG PO TBEC: 20.0000 mg | DELAYED_RELEASE_TABLET | Freq: Every day | ORAL | 4 refills | Status: DC

## 2018-01-30 MED FILL — HYDROCORTISONE 0.2% CREAM: 0.2 | 15 days supply | Qty: 45 | Fill #0

## 2018-01-30 MED FILL — PANTOPRAZOLE SOD DR 20 MG T: 20 | 30 days supply | Qty: 30 | Fill #0

## 2018-01-30 NOTE — Progress Notes (Signed)
Patient ID: Nathaniel Robinson, male    DOB: 12/31/1955  MRN: [id number]  CC: chronic ds management  Subjective: Nathaniel Robinson is a 62 y.o. male who presents for chronic ds management and ER f/u His concerns today include:  Pt with hx of HL, chronic LBP, GERD, peripheral neuropathy  Patient seen in the ER about 2 to 3 weeks ago with acute upper respiratory symptoms.  Diagnosed with acute bronchitis.  He reports that he is now doing much better and symptoms have pretty much resolved.  He is requesting something different for rectal itching.  He had complained of rectal itching in May of this year.  Rectal exam was unrevealing.  Stool for O&P was negative.  I had tried him with some Anusol HC but he reports that he still gets the itching intermittently.  He saw the neurologist since last visit with me for burning in the hands and feet.  Work-up was negative.  Patient states that he was satisfied with that.  He is not taking gabapentin.  Chronic lower back pain: He continues to see PMR.  Has had a few injections to the lower back since last visit with me.  He finds them to be helpful.  He takes the tramadol as needed.  Denies any significant side effects from the tramadol.  Patient Active Problem List   Diagnosis Date Noted  . Paresthesia 01/02/2018  . Acute midline thoracic back pain 09/15/2017  . Gastroesophageal reflux disease without esophagitis 09/15/2017  . Idiopathic peripheral neuropathy 09/15/2017  . Chronic lumbar radiculopathy 02/10/2017  . Hyperlipidemia 03/14/2016  . Lumbar degenerative disc disease 09/11/2015  . Chronic pain syndrome 09/11/2015  . 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 Former heavy cigarette smoker (20-39 per day) 03/17/2014  . Chronic low back pain      Current Outpatient Medications on File Prior to Visit  Medication Sig Dispense Refill  . atorvastatin (LIPITOR) 20 MG tablet Take 1 tablet (20 mg total) by mouth daily. 90  tablet prn  . traMADol (ULTRAM) 50 MG tablet Take 1 tablet (50 mg total) by mouth every 8 (eight) hours as needed for moderate pain. 60 tablet 1   No current facility-administered medications on file prior to visit.     No Known Allergies  Social History   Socioeconomic History  . Marital status: Married    Spouse name: Not on file  . Number of children: 4  . Years of education: come college  . Highest education level: Not on file  Occupational History  . Occupation: disability    Comment: [place]   Social Needs  . Financial resource strain: Not on file  . Food insecurity:    Worry: Not on file    Inability: Not on file  . Transportation needs:    Medical: Not on file    Non-medical: Not on file  Tobacco Use  . Smoking status: Current Every Day Smoker    Packs/day: 0.75    Years: 25.00    Pack years: 18.75    Types: Cigarettes  . Smokeless tobacco: Never Used  . Tobacco comment: 10 cigarettes a day   Substance and Sexual Activity  . Alcohol use: Yes    Alcohol/week: 0.0 standard drinks    Comment: social   . Drug use: No  . Sexual activity: Never  Lifestyle  . Physical activity:    Days per week: Not on file    Minutes per session: Not on  file  . Stress: Not on file  Relationships  . Social connections:    Talks on phone: Not on file    Gets together: Not on file    Attends religious service: Not on file    Active member of club or organization: Not on file    Attends meetings of clubs or organizations: Not on file    Relationship status: Not on file  . Intimate partner violence:    Fear of current or ex partner: Not on file    Emotionally abused: Not on file    Physically abused: Not on file    Forced sexual activity: Not on file  Other Topics Concern  . Not on file  Social History Narrative   Form [place]   Moved to [place] in 01/2014    Lives with roommate.   Right-handed.   No caffeine use.   Wife and 4 children in [place].     Family History    Problem Relation Age of Onset  . Ovarian cancer Mother   . Other Father        unsure of medical history  . Heart disease Neg Hx   . Hypertension Neg Hx   . Diabetes Neg Hx   . Colon cancer Neg Hx     Past Surgical History:  Procedure Laterality Date  . COLONOSCOPY N/A 05/15/2015   Procedure: COLONOSCOPY;  Surgeon: Danie Binder, MD;  [facility];  Service: Endoscopy;  Laterality: N/A;  145 - moved to 1:30 - office to notify    ROS: Review of Systems GI: Patient requesting refill on pantoprazole.    PHYSICAL EXAM: BP 133/77   Pulse 76   Temp 98.4 F (36.9 C) (Oral)   Resp 16   Wt 148 lb 3.2 oz (67.2 kg)   SpO2 99%   BMI 23.92 kg/m   Physical Exam  General appearance - alert, well appearing, and in no distress Mental status - normal mood, behavior, speech, dress, motor activity, and thought processes Mouth - mucous membranes moist, pharynx normal without lesions Neck - supple, no significant adenopathy Chest - clear to auscultation, no wheezes, rales or rhonchi, symmetric air entry Heart - normal rate, regular rhythm, normal S1, S2, no murmurs, rubs, clicks or gallops Extremities - peripheral pulses normal, no pedal edema, no clubbing or cyanosis   ASSESSMENT AND PLAN: 1. Gastroesophageal reflux disease without esophagitis - pantoprazole (PROTONIX) 20 MG tablet; Take 1 tablet (20 mg total) by mouth daily.  Dispense: 30 tablet; Refill: 4  2. Chronic left-sided low back pain without sciatica Continue tramadol as needed.  He continues to follow-up with PMR.  3. Idiopathic peripheral neuropathy No further work-up planned at this time.  4. Rectal itching - hydrocortisone valerate cream (WESTCORT) 0.2 %; Apply small amount to rectal area every other day as needed  Dispense: 45 g; Refill: 0   Patient was given the opportunity to ask questions.  Patient verbalized understanding of the plan and was able to repeat key elements of the plan.  [employer]  interpreter used during this encounter. #[id number]  No orders of the defined types were placed in this encounter.    Requested Prescriptions   Signed Prescriptions Disp Refills  . pantoprazole (PROTONIX) 20 MG tablet 30 tablet 4    Sig: Take 1 tablet (20 mg total) by mouth daily.  . hydrocortisone valerate cream (WESTCORT) 0.2 % 45 g 0    Sig: Apply small amount to rectal area every other  day as needed    Return in about 4 months (around 06/01/2018).  Karle Plumber, MD, FACP

## 2018-02-16 MED FILL — traMADol HCL 50 MG TABS: 50 | 7 days supply | Qty: 21 | Fill #3

## 2018-02-27 ENCOUNTER — Encounter: Payer: Self-pay | Admitting: Physical Medicine & Rehabilitation

## 2018-02-27 ENCOUNTER — Ambulatory Visit: Payer: Medicaid Other | Admitting: Physical Medicine & Rehabilitation

## 2018-02-27 ENCOUNTER — Other Ambulatory Visit: Payer: Self-pay

## 2018-02-27 ENCOUNTER — Encounter: Payer: Medicaid Other | Attending: Physical Medicine & Rehabilitation

## 2018-02-27 VITALS — BP 121/79 | HR 77 | Ht 66.0 in | Wt 147.8 lb

## 2018-02-27 DIAGNOSIS — M5417 Radiculopathy, lumbosacral region: Secondary | ICD-10-CM | POA: Diagnosis present

## 2018-02-27 DIAGNOSIS — M47816 Spondylosis without myelopathy or radiculopathy, lumbar region: Secondary | ICD-10-CM | POA: Diagnosis not present

## 2018-02-27 NOTE — Progress Notes (Signed)
  PROCEDURE RECORD [facility]   Name: Trenten Watchman DOB:09-15-1955 MRN: [id number]  Date:02/27/2018  Physician: Alysia Penna, MD    Nurse/CMA: [place] CMA  Allergies: No Known Allergies  Consent Signed: Yes.    Is patient diabetic? No.  CBG today?   Pregnant: No. LMP: No LMP for male patient. (age 62-55)  Anticoagulants: no Anti-inflammatory: no Antibiotics: no  Procedure: right Lumbar 3-4-5 radoiofrequency neurotomy Position: Prone Start Time: 12:30pm  End Time: 12:50pm   Fluoro Time: 36s  RN/CMA Sports administrator CMA    Time 11:46 12:53pm    BP 121/79 134/80    Pulse 77 74    Respirations 14 14    O2 Sat 97 97    S/S 6 6    Pain Level 5/10 4/10     D/C home on bus, patient A & O X 3, D/C instructions reviewed, and sits independently.

## 2018-02-27 NOTE — Patient Instructions (Signed)

## 2018-02-27 NOTE — Progress Notes (Signed)

## 2018-03-23 MED FILL — ATORVASTATIN 20 MG TABLET: 20 | 30 days supply | Qty: 30 | Fill #3

## 2018-04-02 MED FILL — traMADol HCL 50 MG TABS: 50 | 7 days supply | Qty: 21 | Fill #4

## 2018-04-24 MED FILL — ATORVASTATIN 20 MG TABLET: 20 | 30 days supply | Qty: 30 | Fill #4

## 2018-04-24 MED FILL — traMADol HCL 50 MG TABS: 50 | 5 days supply | Qty: 15 | Fill #5

## 2018-06-01 ENCOUNTER — Other Ambulatory Visit: Payer: Self-pay

## 2018-06-01 ENCOUNTER — Ambulatory Visit: Payer: Medicaid Other | Attending: Internal Medicine | Admitting: Internal Medicine

## 2018-06-01 ENCOUNTER — Encounter: Payer: Self-pay | Admitting: Internal Medicine

## 2018-06-01 DIAGNOSIS — F17210 Nicotine dependence, cigarettes, uncomplicated: Secondary | ICD-10-CM | POA: Diagnosis not present

## 2018-06-01 DIAGNOSIS — Z79899 Other long term (current) drug therapy: Secondary | ICD-10-CM | POA: Diagnosis not present

## 2018-06-01 DIAGNOSIS — E785 Hyperlipidemia, unspecified: Secondary | ICD-10-CM | POA: Diagnosis not present

## 2018-06-01 DIAGNOSIS — M545 Low back pain, unspecified: Secondary | ICD-10-CM

## 2018-06-01 DIAGNOSIS — G629 Polyneuropathy, unspecified: Secondary | ICD-10-CM | POA: Diagnosis not present

## 2018-06-01 DIAGNOSIS — K219 Gastro-esophageal reflux disease without esophagitis: Secondary | ICD-10-CM | POA: Diagnosis not present

## 2018-06-01 DIAGNOSIS — F17200 Nicotine dependence, unspecified, uncomplicated: Secondary | ICD-10-CM | POA: Insufficient documentation

## 2018-06-01 DIAGNOSIS — G8929 Other chronic pain: Secondary | ICD-10-CM | POA: Diagnosis not present

## 2018-06-01 MED ORDER — NICOTINE 21 MG/24HR TD PT24: 21.0000 mg | MEDICATED_PATCH | Freq: Every day | TRANSDERMAL | 0 refills | Status: DC

## 2018-06-01 MED ORDER — ATORVASTATIN CALCIUM 20 MG PO TABS: 20.0000 mg | ORAL_TABLET | Freq: Every day | ORAL | 99 refills | Status: DC

## 2018-06-01 MED ORDER — NICOTINE 14 MG/24HR TD PT24: 14.0000 mg | MEDICATED_PATCH | Freq: Every day | TRANSDERMAL | 0 refills | Status: DC

## 2018-06-01 MED ORDER — TRAMADOL HCL 50 MG PO TABS: 50.0000 mg | ORAL_TABLET | Freq: Three times a day (TID) | ORAL | 0 refills | Status: DC | PRN

## 2018-06-01 MED ORDER — PANTOPRAZOLE SODIUM 20 MG PO TBEC: 20.0000 mg | DELAYED_RELEASE_TABLET | Freq: Every day | ORAL | 4 refills | Status: DC

## 2018-06-01 MED ORDER — NICOTINE 7 MG/24HR TD PT24: 7.0000 mg | MEDICATED_PATCH | Freq: Every day | TRANSDERMAL | 0 refills | Status: DC

## 2018-06-01 NOTE — Progress Notes (Signed)
Virtual Visit via Telephone Note  I connected with Nathaniel Robinson on 06/01/18 at 12:21 p.m by telephone and verified that I am speaking with the correct person using two identifiers. Pt is at home.  Interpreter [id number]) from [employer] used.  Only the patient myself and the interpreter participated in this encounter.   I discussed the limitations, risks, security and privacy concerns of performing an evaluation and management service by telephone and the availability of in person appointments. I also discussed with the patient that there may be a patient responsible charge related to this service. The patient expressed understanding and agreed to proceed.   History of Present Illness: Pt with hx of HL, chronic LBP, GERD, peripheral neuropathy.  Last seen 01/2018.   Rectal itching:  steriod cream has helped.  He uses PRN.  GERD:  Taking Protonix PRN not every day.  Avoids foods that cause flare  Chronic LBP:  Had radio freq neurotomy on lumbar spine 02/2018 which was helpful.  Request RF on Tramadol which he takes PRN.  Needs RF.  No significant adverse effects from the tramadol.  He has a controlled substance prescribing agreement with [place] that will expire next month.  HL:  Compliant with and tolerating Lipitor.  Needs RF  Tob dep:  Smokes 5-10 cig a day.  Smoked for almost 30 yrs.  He was able to quit for 8 mths once in past by using NRT.Marland Kitchen He would like to quit completely.  Wants to try nicotine patches again.   Observations/Objective:   Chemistry      Component Value Date/Time   NA 136 01/11/2018 1307   NA 138 10/31/2017 1236   K 4.2 01/11/2018 1307   CL 104 01/11/2018 1307   CO2 22 01/11/2018 1307   BUN 10 01/11/2018 1307   BUN 10 10/31/2017 1236   CREATININE 0.71 01/11/2018 1307   CREATININE 0.68 (L) 09/08/2015 1041      Component Value Date/Time   CALCIUM 9.1 01/11/2018 1307   ALKPHOS 57 10/31/2017 1236   AST 20 10/31/2017 1236   ALT 19 10/31/2017 1236   BILITOT  0.3 10/31/2017 1236     Lab Results  Component Value Date   CHOL 130 10/31/2017   HDL 45 10/31/2017   LDLCALC 53 10/31/2017   TRIG 162 (H) 10/31/2017   CHOLHDL 2.9 10/31/2017     Assessment and Plan: 1. Tobacco dependence Patient advised to quit smoking. Discussed health risks associated with smoking including lung and other types of cancers, chronic lung diseases and CV risks.. Pt ready to give trail of quitting.  Discussed methods to help quit including quitting cold [place], use of NRT, Chantix and Bupropion.  Patient wanting to try the nicotine patches again.  I went over the stepdown approach with him.  Recommend that he start with the 21 mg patch and then stepdown the dose each month About 5 minutes spent on counseling - nicotine (NICODERM CQ - DOSED IN MG/24 HOURS) 21 mg/24hr patch; Place 1 patch (21 mg total) onto the skin daily.  Dispense: 28 patch; Refill: 0 - nicotine (NICODERM CQ - DOSED IN MG/24 HOURS) 14 mg/24hr patch; Place 1 patch (14 mg total) onto the skin daily.  Dispense: 28 patch; Refill: 0 - nicotine (NICODERM CQ - DOSED IN MG/24 HR) 7 mg/24hr patch; Place 1 patch (7 mg total) onto the skin daily.  Dispense: 28 patch; Refill: 0  2. Chronic left-sided low back pain without sciatica Patient reports good pain relief with  the tramadol which he uses as needed.  He has not had any significant adverse side effects.  We will need to update his controlled substance prescribing agreement on next visit. [place] reviewed. - traMADol (ULTRAM) 50 MG tablet; Take 1 tablet (50 mg total) by mouth every 8 (eight) hours as needed for moderate pain.  Dispense: 60 tablet; Refill: 0  3. Hyperlipidemia, unspecified hyperlipidemia type - atorvastatin (LIPITOR) 20 MG tablet; Take 1 tablet (20 mg total) by mouth daily.  Dispense: 90 tablet; Refill: prn  4. Gastroesophageal reflux disease without esophagitis - pantoprazole (PROTONIX) 20 MG tablet; Take 1 tablet (20 mg total) by mouth daily.   Dispense: 30 tablet; Refill: 4   Follow Up Instructions: Patient to follow-up in 3 months   I discussed the assessment and treatment plan with the patient. The patient was provided an opportunity to ask questions and all were answered. The patient agreed with the plan and demonstrated an understanding of the instructions.   The patient was advised to call back or seek an in-person evaluation if the symptoms worsen or if the condition fails to improve as anticipated.  I provided 18 minutes of non-face-to-face time during this encounter.   Karle Plumber, MD

## 2018-06-20 MED FILL — traMADol HCL 50 MG TABS: 50 | 7 days supply | Qty: 21 | Fill #0

## 2018-06-28 ENCOUNTER — Encounter: Payer: Self-pay | Admitting: Physical Medicine & Rehabilitation

## 2018-06-28 ENCOUNTER — Other Ambulatory Visit: Payer: Self-pay

## 2018-06-28 ENCOUNTER — Encounter: Payer: Medicaid Other | Attending: Physical Medicine & Rehabilitation | Admitting: Physical Medicine & Rehabilitation

## 2018-06-28 VITALS — BP 132/81 | HR 83 | Temp 98.2°F | Wt 144.0 lb

## 2018-06-28 DIAGNOSIS — M47816 Spondylosis without myelopathy or radiculopathy, lumbar region: Secondary | ICD-10-CM | POA: Diagnosis not present

## 2018-06-28 NOTE — Progress Notes (Signed)
Subjective:    Patient ID: Nathaniel Robinson, male    DOB: December 18, 1955, 63 y.o.   MRN: [id number] Patient seen in office with Arab language interpreter HPI Right side low back is doing well since Right L3-4-5 RFA January 2020  The patient indicates that the left-sided low back is starting to feel some pain when he has been walking more than 15 to 20 minutes at a time. A left-sided L3-L4 medial branch and L5 dorsal ramus radiofrequency neurotomy was performed in December 2019  The patient denies any pain radiating aiding down into his lower extremities.     Pain Inventory Average Pain 5 Pain Right Now 5 My pain is aching  In the last 24 hours, has pain interfered with the following? General activity 5 Relation with others 5 Enjoyment of life 5 What TIME of day is your pain at its worst? daytime Sleep (in general) Fair  Pain is worse with: walking, bending, standing and some activites Pain improves with: medication and injections Relief from Meds: 5  Mobility walk without assistance ability to climb steps?  no do you drive?  no  Function disabled: date disabled 2016  Neuro/Psych No problems in this area  Prior Studies Any changes since last visit?  no  Physicians involved in your care Any changes since last visit?  no   Family History  Problem Relation Age of Onset  . Ovarian cancer Mother   . Other Father        unsure of medical history  . Heart disease Neg Hx   . Hypertension Neg Hx   . Diabetes Neg Hx   . Colon cancer Neg Hx    Social History   Socioeconomic History  . Marital status: Married    Spouse name: Not on file  . Number of children: 4  . Years of education: come college  . Highest education level: Not on file  Occupational History  . Occupation: disability    Comment: [place]   Social Needs  . Financial resource strain: Not on file  . Food insecurity:    Worry: Not on file    Inability: Not on file  . Transportation needs:   Medical: Not on file    Non-medical: Not on file  Tobacco Use  . Smoking status: Current Every Day Smoker    Packs/day: 0.75    Years: 25.00    Pack years: 18.75    Types: Cigarettes  . Smokeless tobacco: Never Used  . Tobacco comment: 10 cigarettes a day   Substance and Sexual Activity  . Alcohol use: Yes    Alcohol/week: 0.0 standard drinks    Comment: social   . Drug use: No  . Sexual activity: Never  Lifestyle  . Physical activity:    Days per week: Not on file    Minutes per session: Not on file  . Stress: Not on file  Relationships  . Social connections:    Talks on phone: Not on file    Gets together: Not on file    Attends religious service: Not on file    Active member of club or organization: Not on file    Attends meetings of clubs or organizations: Not on file    Relationship status: Not on file  Other Topics Concern  . Not on file  Social History Narrative   Form [place]   Moved to [place] in 01/2014    Lives with roommate.   Right-handed.   No caffeine use.  Wife and 4 children in [place].    Past Surgical History:  Procedure Laterality Date  . COLONOSCOPY N/A 05/15/2015   Procedure: COLONOSCOPY;  Surgeon: Danie Binder, MD;  [facility];  Service: Endoscopy;  Laterality: N/A;  145 - moved to 1:30 - office to notify   Past Medical History:  Diagnosis Date  . Chronic low back pain 2010   . GERD (gastroesophageal reflux disease)    BP 132/81   Pulse 83   Temp 98.2 F (36.8 C)   Wt 144 lb (65.3 kg)   SpO2 98%   BMI 23.24 kg/m   Opioid Risk Score:   Fall Risk Score:  `1  Depression screen PHQ 2/9  Depression screen [facility] 2/9 02/27/2018 01/30/2018 10/31/2017 09/15/2017 07/04/2017 06/15/2017 04/24/2017  Decreased Interest 0 0 0 0 0 0 0  Down, Depressed, Hopeless 0 0 0 0 0 0 0  PHQ - 2 Score 0 0 0 0 0 0 0  Altered sleeping - - - - - - -  Tired, decreased energy - - - - - - -  Change in appetite - - - - - - -  Feeling bad or failure about  yourself  - - - - - - -  Trouble concentrating - - - - - - -  Moving slowly or fidgety/restless - - - - - - -  Suicidal thoughts - - - - - - -  PHQ-9 Score - - - - - - -  Some recent data might be hidden     Review of Systems  Constitutional: Negative.   HENT: Negative.   Eyes: Negative.   Respiratory: Negative.   Cardiovascular: Negative.   Gastrointestinal: Negative.   Endocrine: Negative.   Genitourinary: Negative.   Musculoskeletal: Positive for arthralgias, back pain and myalgias.  Skin: Negative.   Allergic/Immunologic: Negative.   Neurological: Negative.   Hematological: Negative.   Psychiatric/Behavioral: Negative.   All other systems reviewed and are negative.      Objective:   Physical Exam Vitals signs and nursing note reviewed.  Constitutional:      Appearance: Normal appearance.  HENT:     Head: Normocephalic and atraumatic.     Mouth/Throat:     Mouth: Mucous membranes are moist.  Eyes:     Extraocular Movements: Extraocular movements intact.     Conjunctiva/sclera: Conjunctivae normal.     Pupils: Pupils are equal, round, and reactive to light.  Musculoskeletal:     Lumbar back: He exhibits tenderness and bony tenderness. He exhibits normal range of motion and no deformity.     Comments: Patient has tenderness over the left PSIS area as well as the lumbosacral junction on the left side. He has pain with forward flexion as well as extension and left lateral bending.  Worse pain is with forward flexion.  Skin:    General: Skin is warm and dry.  Neurological:     Mental Status: He is alert. Mental status is at baseline.     Comments: Motor strength is 5/5 bilateral hip flexor knee extensor ankle dorsiflexor Gait is without evidence of toe drag or knee instability he does not require an assistive device   Psychiatric:        Mood and Affect: Mood normal.           Assessment & Plan:  1.  Lumbar spondylosis without myelopathy mainly affecting  L4-5 and L5-S1 levels.  He has had successful reduction of pain using  L3-L4 medial branch radiofrequency neurotomy as well as L5 dorsal ramus radiofrequency neurotomy.  As expected the left side is becoming symptomatic prior to the right side.  We discussed that the radiofrequency neurotomy can be repeated at the 66-month mark which will be in June.  At that time we will ask about the right side and decide whether we need to schedule that for July. Discussed his questions with the help of interpreter.

## 2018-06-29 IMAGING — CR DG CHEST 2V
2 series · 2 of 2 positions shown · non-contrast
Comparison: 08/27/2014.

CLINICAL DATA: Headache.  Fever.

EXAM:
CHEST  2 VIEW

[chest pa]
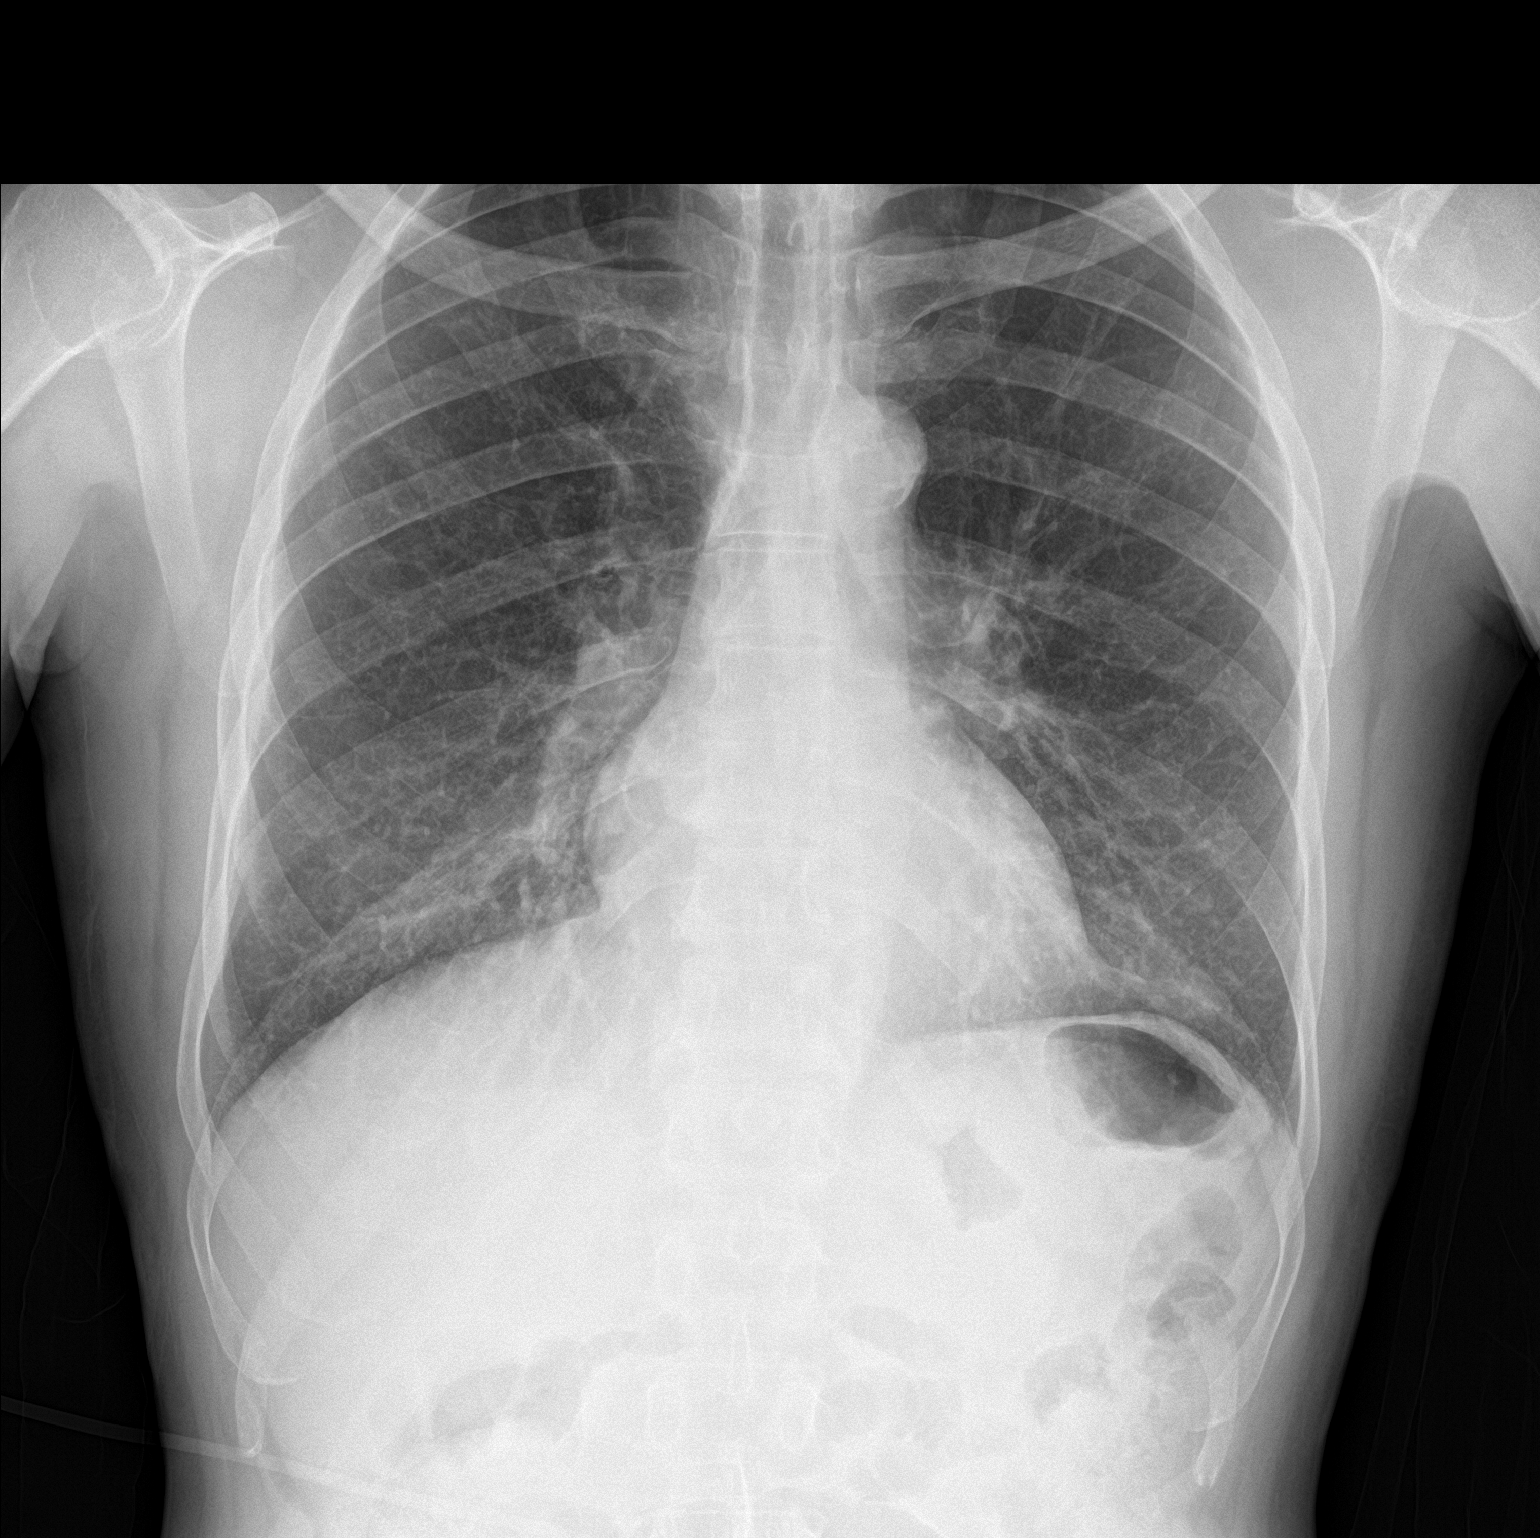

[chest lat]
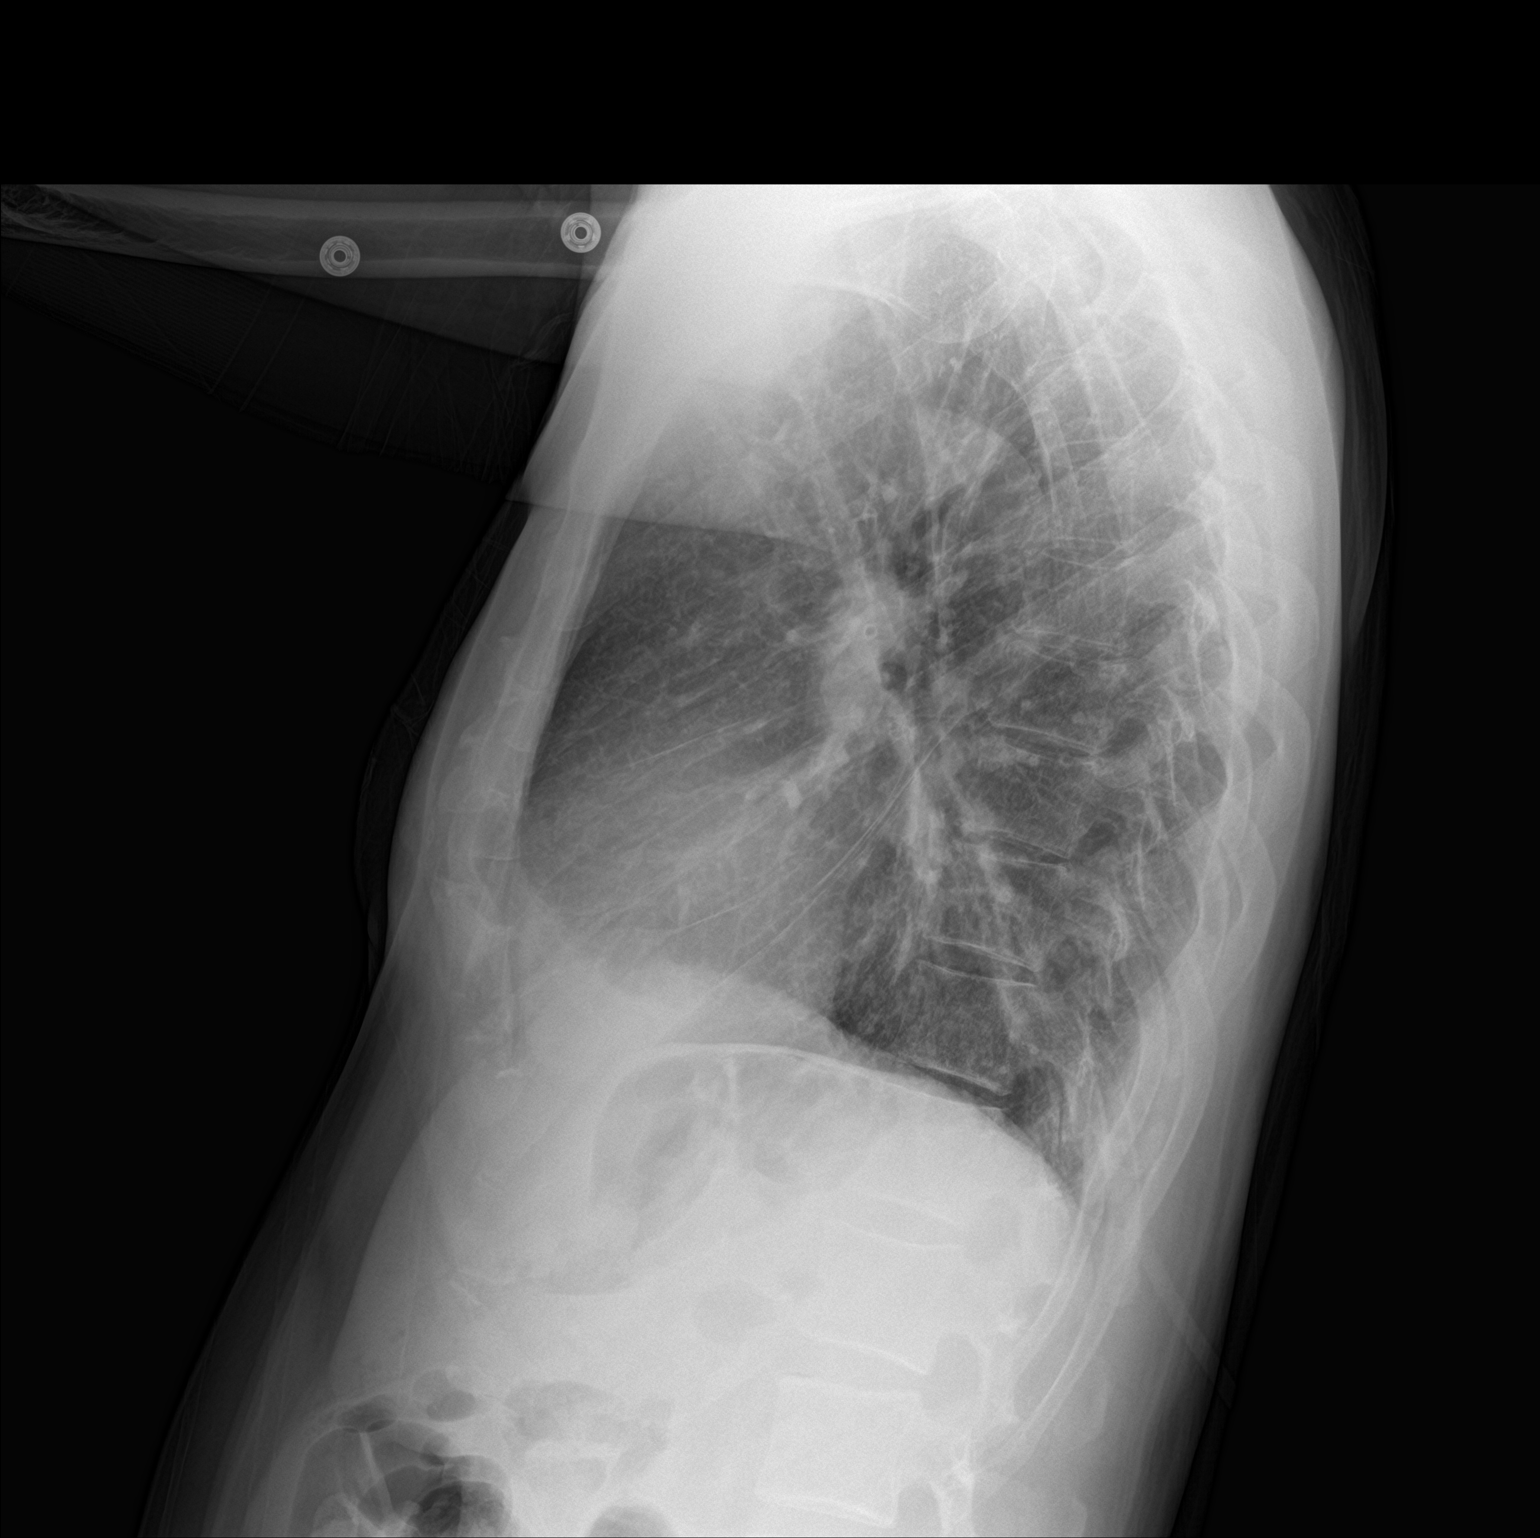

[2 of 2 positions shown; findings below may reference images not displayed]

FINDINGS: Mediastinum and hilar structures are normal. Low lung volumes with
mild bibasilar atelectasis the. no pleural effusion pneumothorax.
Heart size normal . No acute bony abnormality.
IMPRESSION: Low lung volumes with mild bibasilar atelectasis.

## 2018-07-04 ENCOUNTER — Other Ambulatory Visit: Payer: Self-pay | Admitting: Internal Medicine

## 2018-07-04 DIAGNOSIS — K219 Gastro-esophageal reflux disease without esophagitis: Secondary | ICD-10-CM

## 2018-07-19 MED FILL — traMADol HCL 50 MG TABS: 50 | 7 days supply | Qty: 21 | Fill #1

## 2018-07-26 ENCOUNTER — Encounter: Payer: Medicaid Other | Attending: Physical Medicine & Rehabilitation | Admitting: Physical Medicine & Rehabilitation

## 2018-07-26 ENCOUNTER — Other Ambulatory Visit: Payer: Self-pay

## 2018-07-26 ENCOUNTER — Encounter: Payer: Self-pay | Admitting: Physical Medicine & Rehabilitation

## 2018-07-26 VITALS — BP 108/76 | HR 92 | Temp 97.0°F | Ht 65.0 in | Wt 142.0 lb

## 2018-07-26 DIAGNOSIS — M47816 Spondylosis without myelopathy or radiculopathy, lumbar region: Secondary | ICD-10-CM | POA: Diagnosis not present

## 2018-07-26 NOTE — Progress Notes (Signed)
Subjective:    Patient ID: Nathaniel Robinson, male    DOB: [birth year], 63 y.o.   MRN: [id number]  HPI 63 year old male with history of lumbar pain as well as left lower extremity pain.  The patient initially benefited from lumbar epidural injections but more recently he has had more axial back pain.  He has undergone L3-L4 medial branch and L5 dorsal ramus radiofrequency neurotomies 02/27/2018 right side L5 dorsal ramus, L3-L4 medial branch radiofrequency neurotomy under fluoroscopic guidance 01/19/2018 left L5 dorsal ramus., left L4 and left L3 medial branch radio frequency neurotomy under fluoroscopic guidance  Patient was seen approximate 1 month ago in clinic where he was complaining of increasing pain in the back.  The patient stated was mainly right-sided.  He was scheduled for radiofrequency neurotomy today however he states that the right-sided pain has subsided in its 3-4 out of 10 which for him is tolerated  Primary care provider is prescribing tramadol 50 mg twice daily as needed he does not take this on a daily basis  Primary pain Inventory Average Pain 4 Pain Right Now 4 My pain is aching  In the last 24 hours, has pain interfered with the following? General activity 5 Relation with others 5 Enjoyment of life 5 What TIME of day is your pain at its worst? daytime Sleep (in general) Fair  Pain is worse with: walking, bending, standing and some activites Pain improves with: rest, medication and injections Relief from Meds: 6  Mobility walk without assistance ability to climb steps?  no do you drive?  no  Function disabled: date disabled 2016  Neuro/Psych No problems in this area  Prior Studies Any changes since last visit?  no  Physicians involved in your care Any changes since last visit?  no   Family History  Problem Relation Age of Onset  . Ovarian cancer Mother   . Other Father        unsure of medical history  . Heart disease Neg Hx   . Hypertension Neg  Hx   . Diabetes Neg Hx   . Colon cancer Neg Hx    Social History   Socioeconomic History  . Marital status: Married    Spouse name: Not on file  . Number of children: 4  . Years of education: come college  . Highest education level: Not on file  Occupational History  . Occupation: disability    Comment: [place]   Social Needs  . Financial resource strain: Not on file  . Food insecurity    Worry: Not on file    Inability: Not on file  . Transportation needs    Medical: Not on file    Non-medical: Not on file  Tobacco Use  . Smoking status: Former Smoker    Packs/day: 0.75    Years: 25.00    Pack years: 18.75    Types: Cigarettes  . Smokeless tobacco: Never Used  . Tobacco comment: 10 cigarettes a day   Substance and Sexual Activity  . Alcohol use: Yes    Alcohol/week: 0.0 standard drinks    Comment: social   . Drug use: No  . Sexual activity: Never  Lifestyle  . Physical activity    Days per week: Not on file    Minutes per session: Not on file  . Stress: Not on file  Relationships  . Social Herbalist on phone: Not on file    Gets together: Not on file    Attends  religious service: Not on file    Active member of club or organization: Not on file    Attends meetings of clubs or organizations: Not on file    Relationship status: Not on file  Other Topics Concern  . Not on file  Social History Narrative   Form [place]   Moved to [place] in 01/2014    Lives with roommate.   Right-handed.   No caffeine use.   Wife and 4 children in [place].    Past Surgical History:  Procedure Laterality Date  . COLONOSCOPY N/A 05/15/2015   Procedure: COLONOSCOPY;  Surgeon: Danie Binder, MD;  [facility];  Service: Endoscopy;  Laterality: N/A;  145 - moved to 1:30 - office to notify   Past Medical History:  Diagnosis Date  . Chronic low back pain 2010   . GERD (gastroesophageal reflux disease)    BP 108/76   Pulse 92   Temp (!) 97 F (36.1 C)    Ht 5\' 5"  (1.651 m)   Wt 142 lb (64.4 kg)   SpO2 97%   BMI 23.63 kg/m   Opioid Risk Score:   Fall Risk Score:  `1  Depression screen PHQ 2/9  Depression screen [facility] 2/9 02/27/2018 01/30/2018 10/31/2017 09/15/2017 07/04/2017 06/15/2017 04/24/2017  Decreased Interest 0 0 0 0 0 0 0  Down, Depressed, Hopeless 0 0 0 0 0 0 0  PHQ - 2 Score 0 0 0 0 0 0 0  Altered sleeping - - - - - - -  Tired, decreased energy - - - - - - -  Change in appetite - - - - - - -  Feeling bad or failure about yourself  - - - - - - -  Trouble concentrating - - - - - - -  Moving slowly or fidgety/restless - - - - - - -  Suicidal thoughts - - - - - - -  PHQ-9 Score - - - - - - -  Some recent data might be hidden     Review of Systems  Constitutional: Negative.   HENT: Negative.   Eyes: Negative.   Respiratory: Negative.   Cardiovascular: Negative.   Gastrointestinal: Negative.   Endocrine: Negative.   Genitourinary: Negative.   Musculoskeletal: Positive for arthralgias, back pain and myalgias.  Skin: Negative.   Allergic/Immunologic: Negative.   Neurological: Negative.   Hematological: Negative.   Psychiatric/Behavioral: Negative.   All other systems reviewed and are negative.      Objective:   Physical Exam Constitutional:      Appearance: Normal appearance.  Skin:    General: Skin is warm and dry.  Neurological:     Mental Status: He is alert.  Psychiatric:        Mood and Affect: Mood normal.        Behavior: Behavior normal.     General no acute distress Mood and affect are appropriate Extremities without edema Patient has normal strength in the lower limbs His lumbar range of motion is 75% flexion extension lateral bending and rotation. Gait without evidence of antalgia or foot drop     Assessment & Plan:  1.  Lumbar spondylosis without myelopathy, he has done well with radiofrequency neurotomies.  Approximately once months ago he was developing increasing pain and it appeared that  the radiofrequency neurotomy needed to be repeated however today and over the last several days his pain has improved therefore we will hold off on repeat procedure. We  discussed that is difficult to present predict the duration of response.  The patient has been instructed that if his pain gets into the 5-6 out of 10 range and persist for at least 1 week he should call for a repeat procedure.  He will need to indicate which side is hurting more.

## 2018-07-26 NOTE — Patient Instructions (Signed)

## 2018-08-20 MED FILL — traMADol HCL 50 MG TABS: 50 | 6 days supply | Qty: 18 | Fill #2

## 2018-08-31 ENCOUNTER — Ambulatory Visit: Payer: Medicaid Other | Admitting: Internal Medicine

## 2018-09-14 ENCOUNTER — Encounter: Payer: Self-pay | Admitting: Physical Medicine & Rehabilitation

## 2018-09-14 ENCOUNTER — Other Ambulatory Visit: Payer: Self-pay

## 2018-09-14 ENCOUNTER — Encounter: Payer: Medicaid Other | Attending: Physical Medicine & Rehabilitation | Admitting: Physical Medicine & Rehabilitation

## 2018-09-14 VITALS — BP 118/79 | HR 80 | Temp 98.2°F | Ht 65.0 in | Wt 142.0 lb

## 2018-09-14 DIAGNOSIS — M47816 Spondylosis without myelopathy or radiculopathy, lumbar region: Secondary | ICD-10-CM | POA: Insufficient documentation

## 2018-09-14 NOTE — Patient Instructions (Addendum)
You had a radio frequency procedure today This was done to alleviate joint pain in your lumbar area We injected lidocaine which is a local anesthetic.  You may experience soreness at the injection sites. You may also experienced some irritation of the nerves that were heated I'm recommending ice for 30 minutes every 2 hours as needed for the next 24-48 hours   

## 2018-09-14 NOTE — Progress Notes (Signed)
Left L5 dorsal ramus., left L4 and left L3 medial branch radio frequency neurotomy under fluoroscopic guidance  Indication: Low back pain due to lumbar spondylosis which has been relieved on 2 occasions by greater than 50% by lumbar medial branch blocks at corresponding levels.  Informed consent was obtained after describing risks and benefits of the procedure with the patient, this includes bleeding, bruising, infection, paralysis and medication side effects. The patient wishes to proceed and has given written consent. The patient was placed in a prone position. The lumbar and sacral area was marked and prepped with Betadine. A 25-gauge 1-1/2 inch needle was inserted into the skin and subcutaneous tissue at 3 sites in one ML of 1% lidocaine was injected into each site. Then a 18-gauge 10 cm radio frequency needle with a 1 cm curved active tip was inserted targeting the left S1 SAP/sacral ala junction. Bone contact was made and confirmed with lateral imaging.  motor stimulation at 2 Hz confirm proper needle location followed by injection of one ML of 1% MPF lidocaine. Then the left L5 SAP/transverse process junction was targeted. Bone contact was made and confirmed with lateral imaging motor stimulation at 2 Hz confirm proper needle location followed by injection of one ML of the solution containing one ML of  1% MPF lidocaine. Then the left L4 SAP/transverse process junction was targeted. Bone contact was made and confirmed with lateral imaging. motor stimulation at 2 Hz confirm proper needle location followed by injection of one ML of the solution containing one ML of1% MPF lidocaine. Radio frequency lesion  at 80C for 90 seconds was performed. Needles were removed. Post procedure instructions and vital signs were performed. Patient tolerated procedure well. Followup appointment was given.  

## 2018-09-14 NOTE — Progress Notes (Signed)
  PROCEDURE RECORD [facility]   Name: Nathaniel Robinson DOB:[birth year] MRN: [id number]  Date:09/14/2018  Physician: Alysia Penna, MD    Nurse/CMA: Bright CMA  Allergies: No Known Allergies  Consent Signed: Yes.    Is patient diabetic? No.  CBG today? NA  Pregnant: No. LMP: No LMP for male patient. (age 63-55)  Anticoagulants: no Anti-inflammatory: no Antibiotics: no  Procedure:  Position: Prone   Start Time: 320pm End Time: 331pm Fluoro Time: 34s  RN/CMA Bright CMA Bright CMA    Time 247pm 339pm    BP 118/79 123/72    Pulse 80 73    Respirations 16 16    O2 Sat 97 96    S/S 6 6    Pain Level 6/10 0/10     D/C home with transportation, patient A & O X 3, D/C instructions reviewed, and sits independently.

## 2018-09-24 ENCOUNTER — Encounter: Payer: Self-pay | Admitting: Internal Medicine

## 2018-09-24 ENCOUNTER — Other Ambulatory Visit: Payer: Self-pay

## 2018-09-24 ENCOUNTER — Ambulatory Visit: Payer: Medicaid Other | Attending: Internal Medicine | Admitting: Internal Medicine

## 2018-09-24 DIAGNOSIS — Z79899 Other long term (current) drug therapy: Secondary | ICD-10-CM | POA: Insufficient documentation

## 2018-09-24 DIAGNOSIS — E785 Hyperlipidemia, unspecified: Secondary | ICD-10-CM | POA: Diagnosis not present

## 2018-09-24 DIAGNOSIS — G8929 Other chronic pain: Secondary | ICD-10-CM | POA: Diagnosis not present

## 2018-09-24 DIAGNOSIS — Z87891 Personal history of nicotine dependence: Secondary | ICD-10-CM | POA: Diagnosis not present

## 2018-09-24 DIAGNOSIS — K219 Gastro-esophageal reflux disease without esophagitis: Secondary | ICD-10-CM | POA: Diagnosis not present

## 2018-09-24 DIAGNOSIS — M545 Low back pain, unspecified: Secondary | ICD-10-CM

## 2018-09-24 DIAGNOSIS — G629 Polyneuropathy, unspecified: Secondary | ICD-10-CM | POA: Diagnosis not present

## 2018-09-24 MED ORDER — TRAMADOL HCL 50 MG PO TABS: 50.0000 mg | ORAL_TABLET | Freq: Three times a day (TID) | ORAL | 0 refills | Status: DC | PRN

## 2018-09-24 NOTE — Progress Notes (Signed)
Virtual Visit via Telephone Note Due to current restrictions/limitations of in-office visits due to the COVID-19 pandemic, this scheduled clinical appointment was converted to a telehealth visit  I connected with Nathaniel Robinson on 09/24/18 at 1:46 p.m by telephone and verified that I am speaking with the correct person using two identifiers. I am in my office.  The patient is at home.  The patient, myself and Darlyne Russian [phone]) participated in this encounter.  I discussed the limitations, risks, security and privacy concerns of performing an evaluation and management service by telephone and the availability of in person appointments. I also discussed with the patient that there may be a patient responsible charge related to this service. The patient expressed understanding and agreed to proceed.   History of Present Illness: Pt with hx of HL, chronic LBP, GERD,peripheral neuropathy, tob dep.  Chronic LBP:  Had inj to lower back the end of last mth by Dr. Letta Pate which he found helpful.  Still takes Tramadol when needed.  Denies any significant S.E of the med.  He is functional.  Requests refill.  Last controlled substance prescribing agreement was done 06/2017.  Tob Dep:  Quit smoking since last visit with me 05/2018  HL:  Compliant with Lipitor.   Outpatient Encounter Medications as of 09/24/2018  Medication Sig  . atorvastatin (LIPITOR) 20 MG tablet Take 1 tablet (20 mg total) by mouth daily.  . hydrocortisone valerate cream (WESTCORT) 0.2 % Apply small amount to rectal area every other day as needed  . pantoprazole (PROTONIX) 20 MG tablet TAKE 1 TABLET(20 MG) BY MOUTH DAILY  . traMADol (ULTRAM) 50 MG tablet Take 1 tablet (50 mg total) by mouth every 8 (eight) hours as needed for moderate pain.   No facility-administered encounter medications on file as of 09/24/2018.       Observations/Objective:   Chemistry      Component Value Date/Time   NA 136 01/11/2018 1307    NA 138 10/31/2017 1236   K 4.2 01/11/2018 1307   CL 104 01/11/2018 1307   CO2 22 01/11/2018 1307   BUN 10 01/11/2018 1307   BUN 10 10/31/2017 1236   CREATININE 0.71 01/11/2018 1307   CREATININE 0.68 (L) 09/08/2015 1041      Component Value Date/Time   CALCIUM 9.1 01/11/2018 1307   ALKPHOS 57 10/31/2017 1236   AST 20 10/31/2017 1236   ALT 19 10/31/2017 1236   BILITOT 0.3 10/31/2017 1236     Lab Results  Component Value Date   WBC 7.7 01/11/2018   HGB 15.8 01/11/2018   HCT 48.2 01/11/2018   MCV 96.2 01/11/2018   PLT 260 01/11/2018     Assessment and Plan: 1. Chronic left-sided low back pain without sciatica Followed by PMR.  Recently had medial branch radiofrequency neurotomy under fluoroscopic guidance to left L5, L4 and L3 with improvement in pain level.  He takes tramadol sparingly for breakthrough pain with good results.  We will need to update his controlled substance prescribing agreement on follow-up visit.[facility] reviewed - traMADol (ULTRAM) 50 MG tablet; Take 1 tablet (50 mg total) by mouth every 8 (eight) hours as needed for moderate pain.  Dispense: 60 tablet; Refill: 0  2. Hyperlipidemia, unspecified hyperlipidemia type Continue atorvastatin.  Plan to do blood tests on his next in person visit  3. Former smoker Commended him on quitting and encouraged him to remain tobacco free.  Less than 5 minutes spent on counseling   Follow Up Instructions: 3  mths   I discussed the assessment and treatment plan with the patient. The patient was provided an opportunity to ask questions and all were answered. The patient agreed with the plan and demonstrated an understanding of the instructions.   The patient was advised to call back or seek an in-person evaluation if the symptoms worsen or if the condition fails to improve as anticipated.  I provided 6 minutes of non-face-to-face time during this encounter.   Karle Plumber, MD

## 2018-10-08 ENCOUNTER — Ambulatory Visit: Payer: Medicaid Other | Attending: Internal Medicine

## 2018-10-08 ENCOUNTER — Other Ambulatory Visit: Payer: Self-pay

## 2018-10-08 DIAGNOSIS — Z23 Encounter for immunization: Secondary | ICD-10-CM | POA: Diagnosis not present

## 2018-10-08 NOTE — Patient Instructions (Signed)
Influenza Virus Vaccine injection (Fluarix) What is this medicine? INFLUENZA VIRUS VACCINE (in floo EN zuh VAHY ruhs vak SEEN) helps to reduce the risk of getting influenza also known as the flu. This medicine may be used for other purposes; ask your health care provider or pharmacist if you have questions. COMMON BRAND NAME(S): Fluarix, Fluzone What should I tell my health care provider before I take this medicine? They need to know if you have any of these conditions:  bleeding disorder like hemophilia  fever or infection  Guillain-Barre syndrome or other neurological problems  immune system problems  infection with the human immunodeficiency virus (HIV) or AIDS  low blood platelet counts  multiple sclerosis  an unusual or allergic reaction to influenza virus vaccine, eggs, chicken proteins, latex, gentamicin, other medicines, foods, dyes or preservatives  pregnant or trying to get pregnant  breast-feeding How should I use this medicine? This vaccine is for injection into a muscle. It is given by a health care professional. A copy of Vaccine Information Statements will be given before each vaccination. Read this sheet carefully each time. The sheet may change frequently. Talk to your pediatrician regarding the use of this medicine in children. Special care may be needed. Overdosage: If you think you have taken too much of this medicine contact a poison control center or emergency room at once. NOTE: This medicine is only for you. Do not share this medicine with others. What if I miss a dose? This does not apply. What may interact with this medicine?  chemotherapy or radiation therapy  medicines that lower your immune system like etanercept, anakinra, infliximab, and adalimumab  medicines that treat or prevent blood clots like warfarin  phenytoin  steroid medicines like prednisone or cortisone  theophylline  vaccines This list may not describe all possible  interactions. Give your health care provider a list of all the medicines, herbs, non-prescription drugs, or dietary supplements you use. Also tell them if you smoke, drink alcohol, or use illegal drugs. Some items may interact with your medicine. What should I watch for while using this medicine? Report any side effects that do not go away within 3 days to your doctor or health care professional. Call your health care provider if any unusual symptoms occur within 6 weeks of receiving this vaccine. You may still catch the flu, but the illness is not usually as bad. You cannot get the flu from the vaccine. The vaccine will not protect against colds or other illnesses that may cause fever. The vaccine is needed every year. What side effects may I notice from receiving this medicine? Side effects that you should report to your doctor or health care professional as soon as possible:  allergic reactions like skin rash, itching or hives, swelling of the face, lips, or tongue Side effects that usually do not require medical attention (report to your doctor or health care professional if they continue or are bothersome):  fever  headache  muscle aches and pains  pain, tenderness, redness, or swelling at site where injected  weak or tired This list may not describe all possible side effects. Call your doctor for medical advice about side effects. You may report side effects to FDA at 1-800-FDA-1088. Where should I keep my medicine? This vaccine is only given in a clinic, pharmacy, doctor's office, or other health care setting and will not be stored at home. NOTE: This sheet is a summary. It may not cover all possible information. If you have questions   about this medicine, talk to your doctor, pharmacist, or health care provider.  2020 Elsevier/Gold Standard (2007-08-29 09:30:40)  

## 2018-10-08 NOTE — Progress Notes (Signed)
Pt came into the office today for a flu vaccine

## 2018-10-09 ENCOUNTER — Ambulatory Visit: Payer: Medicaid Other | Admitting: Pharmacist

## 2018-11-12 ENCOUNTER — Emergency Department (HOSPITAL_COMMUNITY)
Admission: EM | Admit: 2018-11-12 | Discharge: 2018-11-12 | Disposition: A | Discharge status: Home / Self Care | Payer: Medicaid Other | Attending: Emergency Medicine | Admitting: Emergency Medicine

## 2018-11-12 ENCOUNTER — Encounter (HOSPITAL_COMMUNITY): Payer: Self-pay

## 2018-11-12 ENCOUNTER — Other Ambulatory Visit: Payer: Self-pay

## 2018-11-12 ENCOUNTER — Emergency Department (HOSPITAL_COMMUNITY): Payer: Medicaid Other

## 2018-11-12 DIAGNOSIS — Z79899 Other long term (current) drug therapy: Secondary | ICD-10-CM | POA: Diagnosis not present

## 2018-11-12 DIAGNOSIS — Z87891 Personal history of nicotine dependence: Secondary | ICD-10-CM | POA: Insufficient documentation

## 2018-11-12 DIAGNOSIS — R51 Headache: Secondary | ICD-10-CM | POA: Diagnosis not present

## 2018-11-12 DIAGNOSIS — R519 Headache, unspecified: Secondary | ICD-10-CM

## 2018-11-12 LAB — CBC WITH DIFFERENTIAL/PLATELET
Abs Immature Granulocytes: 0.01 10*3/uL (ref 0.00–0.07)
Basophils Absolute: 0 10*3/uL (ref 0.0–0.1)
Basophils Relative: 1 %
Eosinophils Absolute: 0.1 10*3/uL (ref 0.0–0.5)
Eosinophils Relative: 2 %
HCT: 45.9 % (ref 39.0–52.0)
Hemoglobin: 15.7 g/dL (ref 13.0–17.0)
Immature Granulocytes: 0 %
Lymphocytes Relative: 51 %
Lymphs Abs: 2.9 10*3/uL (ref 0.7–4.0)
MCH: 32.9 pg (ref 26.0–34.0)
MCHC: 34.2 g/dL (ref 30.0–36.0)
MCV: 96.2 fL (ref 80.0–100.0)
Monocytes Absolute: 0.5 10*3/uL (ref 0.1–1.0)
Monocytes Relative: 10 %
Neutro Abs: 2 10*3/uL (ref 1.7–7.7)
Neutrophils Relative %: 36 %
Platelets: 209 10*3/uL (ref 150–400)
RBC: 4.77 MIL/uL (ref 4.22–5.81)
RDW: 13.1 % (ref 11.5–15.5)
WBC: 5.6 10*3/uL (ref 4.0–10.5)
nRBC: 0 % (ref 0.0–0.2)

## 2018-11-12 LAB — BASIC METABOLIC PANEL
Anion gap: 9 (ref 5–15)
BUN: 13 mg/dL (ref 8–23)
CO2: 23 mmol/L (ref 22–32)
Calcium: 8.9 mg/dL (ref 8.9–10.3)
Chloride: 108 mmol/L (ref 98–111)
Creatinine, Ser: 0.71 mg/dL (ref 0.61–1.24)
GFR calc Af Amer: >60 mL/min (ref >60)
GFR calc non Af Amer: >60 mL/min (ref >60)
Glucose, Bld: 107 mg/dL — ABNORMAL HIGH (ref 70–99)
Potassium: 4.2 mmol/L (ref 3.5–5.1)
Sodium: 140 mmol/L (ref 135–145)

## 2018-11-12 MED ORDER — KETOROLAC TROMETHAMINE 30 MG/ML IJ SOLN
30.0000 mg | Freq: Once | INTRAMUSCULAR | Status: AC
  Administered 2018-11-12: 30 mg via INTRAVENOUS
  Filled 2018-11-12: qty 1

## 2018-11-12 MED ORDER — PROCHLORPERAZINE EDISYLATE 10 MG/2ML IJ SOLN
10.0000 mg | Freq: Once | INTRAMUSCULAR | Status: AC
  Administered 2018-11-12: 10 mg via INTRAVENOUS
  Filled 2018-11-12: qty 2

## 2018-11-12 MED ORDER — SODIUM CHLORIDE 0.9 % IV BOLUS
1000.0000 mL | Freq: Once | INTRAVENOUS | Status: AC
  Administered 2018-11-12: 1000 mL via INTRAVENOUS

## 2018-11-12 MED ORDER — DEXAMETHASONE SODIUM PHOSPHATE 10 MG/ML IJ SOLN
10.0000 mg | Freq: Once | INTRAMUSCULAR | Status: AC
  Administered 2018-11-12: 10 mg via INTRAVENOUS
  Filled 2018-11-12: qty 1

## 2018-11-12 NOTE — ED Triage Notes (Signed)
Patient complains of headache x 1 week with chills. Denies fever, no congestion. Denies trauma. Alert and oriented

## 2018-11-12 NOTE — ED Provider Notes (Signed)
4:30 PM signout from lawyer PA-C at shift change.  Patient complains of headache ongoing over the past 1 week with some chills.  He denies any fever, chest pain, cough, shortness of breath.  No coronavirus contacts.  No strokelike symptoms.  Patient has been treated here with migraine cocktail including Toradol.  He has had a negative head CT and normal-appearing labs.  On reexam, patient is sitting upright in a chair upon entering the room.  He appears well, nontoxic.  Is all extremities purposefully.  No ataxia.  He is requesting to go home because he is hungry.  Patient counseled to return if they have weakness in their arms or legs, slurred speech, trouble walking or talking, confusion, trouble with their balance, or if they have any other concerns. Patient verbalizes understanding and agrees with plan.   BP 127/73   Pulse 72   Temp 99.1 F (37.3 C) (Oral)   Resp 16   SpO2 95%     Carlisle Cater, PA-C 11/12/18 1632    Dorie Rank, MD 11/14/18 [phone]

## 2018-11-12 NOTE — Discharge Instructions (Signed)
Please read and follow all provided instructions.  Your diagnoses today include:  1. Acute nonintractable headache, unspecified headache type     Tests performed today include:  CT of your head which was normal and did not show any serious cause of your headache  Blood counts and electrolytes  Vital signs. See below for your results today.   Medications:  In the Emergency Department you received:  Reglan - antinausea/headache medication  Benadryl - antihistamine to counteract potential side effects of reglan  Toradol - NSAID medication similar to ibuprofen  Take any prescribed medications only as directed.  Additional information:  Follow any educational materials contained in this packet.  You are having a headache. No specific cause was found today for your headache. It may have been a migraine or other cause of headache. Stress, anxiety, fatigue, and depression are common triggers for headaches.   Your headache today does not appear to be life-threatening or require hospitalization, but often the exact cause of headaches is not determined in the emergency department. Therefore, follow-up with your doctor is very important to find out what may have caused your headache and whether or not you need any further diagnostic testing or treatment.   Sometimes headaches can appear benign (not harmful), but then more serious symptoms can develop which should prompt an immediate re-evaluation by your doctor or the emergency department.  BE VERY CAREFUL not to take multiple medicines containing Tylenol (also called acetaminophen). Doing so can lead to an overdose which can damage your liver and cause liver failure and possibly death.   Follow-up instructions: Please follow-up with your primary care provider in the next 3 days for further evaluation of your symptoms.   Return instructions:   Please return to the Emergency Department if you experience worsening symptoms.  Return if  the medications do not resolve your headache, if it recurs, or if you have multiple episodes of vomiting or cannot keep down fluids.  Return if you have a change from the usual headache.  RETURN IMMEDIATELY IF you:  Develop a sudden, severe headache  Develop confusion or become poorly responsive or faint  Develop a fever above 100.32F or problem breathing  Have a change in speech, vision, swallowing, or understanding  Develop new weakness, numbness, tingling, incoordination in your arms or legs  Have a seizure  Please return if you have any other emergent concerns.  Additional Information:  Your vital signs today were: BP 127/73    Pulse 72    Temp 99.1 F (37.3 C) (Oral)    Resp 16    SpO2 95%  If your blood pressure (BP) was elevated above 135/85 this visit, please have this repeated by your doctor within one month. --------------

## 2018-11-12 NOTE — ED Notes (Signed)
Patient verbalizes understanding of discharge instructions. Opportunity for questioning and answers were provided. Armband removed by staff, pt discharged from ED ambulatory to home.  

## 2018-11-19 NOTE — ED Provider Notes (Signed)
[facility] EMERGENCY DEPARTMENT Provider Note   CSN: [id number] Arrival date & time: 11/12/18  1024     History   Chief Complaint No chief complaint on file.   HPI Nathaniel Robinson is a 63 y.o. male.     HPI Patient presents to the emergency department with a headache over the last 7 days.  The patient states that he did not take any medications prior to arrival for his symptoms.  Patient states that the headache seems to be across the front of his head.  Patient states that nothing seems to make his condition better or worse.  Patient states that he has had no other symptoms associated with this headache.  The patient denies chest pain, shortness of breath, blurred vision, neck pain, fever, cough, weakness, numbness, dizziness, anorexia, edema, abdominal pain, nausea, vomiting, diarrhea, rash, back pain, dysuria, hematemesis, bloody stool, near syncope, or syncope. Past Medical History:  Diagnosis Date  . Chronic low back pain 2010   . GERD (gastroesophageal reflux disease)     Patient Active Problem List   Diagnosis Date Noted  . Flu vaccine need 10/08/2018  . Former smoker 09/24/2018  . Spondylosis without myelopathy or radiculopathy, lumbar region 09/14/2018  . Tobacco dependence 06/01/2018  . Paresthesia 01/02/2018  . Acute midline thoracic back pain 09/15/2017  . Gastroesophageal reflux disease without esophagitis 09/15/2017  . Idiopathic peripheral neuropathy 09/15/2017  . Chronic lumbar radiculopathy 02/10/2017  . Hyperlipidemia 03/14/2016  . Lumbar degenerative disc disease 09/11/2015  . Chronic pain syndrome 09/11/2015  . 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 Former heavy cigarette smoker (20-39 per day) 03/17/2014  . Chronic low back pain     Past Surgical History:  Procedure Laterality Date  . COLONOSCOPY N/A 05/15/2015   Procedure: COLONOSCOPY;  Surgeon: Danie Binder, MD;  Location: [facility];  Service:  Endoscopy;  Laterality: N/A;  145 - moved to 1:30 - office to notify        Home Medications    Prior to Admission medications   Medication Sig Start Date End Date Taking? Authorizing Provider  atorvastatin (LIPITOR) 20 MG tablet Take 1 tablet (20 mg total) by mouth daily. 06/01/18   Ladell Pier, MD  hydrocortisone valerate cream (WESTCORT) 0.2 % Apply small amount to rectal area every other day as needed 01/30/18   Ladell Pier, MD  pantoprazole (PROTONIX) 20 MG tablet TAKE 1 TABLET(20 MG) BY MOUTH DAILY 07/05/18   Ladell Pier, MD  traMADol (ULTRAM) 50 MG tablet Take 1 tablet (50 mg total) by mouth every 8 (eight) hours as needed for moderate pain. 09/24/18   Ladell Pier, MD    Family History Family History  Problem Relation Age of Onset  . Ovarian cancer Mother   . Other Father        unsure of medical history  . Heart disease Neg Hx   . Hypertension Neg Hx   . Diabetes Neg Hx   . Colon cancer Neg Hx     Social History Social History   Tobacco Use  . Smoking status: Former Smoker    Types: Cigarettes  . Smokeless tobacco: Never Used  Substance Use Topics  . Alcohol use: Yes    Alcohol/week: 0.0 standard drinks    Comment: social   . Drug use: No     Allergies   Patient has no known allergies.   Review of Systems Review of Systems  All other systems negative except as documented in the HPI. All pertinent positives and negatives as reviewed in the HPI. Physical Exam Updated Vital Signs BP 127/73   Pulse 72   Temp 99.1 F (37.3 C) (Oral)   Resp 16   SpO2 95%   Physical Exam Vitals signs and nursing note reviewed.  Constitutional:      General: He is not in acute distress.    Appearance: He is well-developed.  HENT:     Head: Normocephalic and atraumatic.  Eyes:     Pupils: Pupils are equal, round, and reactive to light.  Neck:     Musculoskeletal: Normal range of motion and neck supple.  Cardiovascular:     Rate and  Rhythm: Normal rate and regular rhythm.     Heart sounds: Normal heart sounds. No murmur. No friction rub. No gallop.   Pulmonary:     Effort: Pulmonary effort is normal. No respiratory distress.     Breath sounds: Normal breath sounds. No wheezing.  Abdominal:     General: Bowel sounds are normal. There is no distension.     Palpations: Abdomen is soft.     Tenderness: There is no abdominal tenderness.  Skin:    General: Skin is warm and dry.     Capillary Refill: Capillary refill takes less than 2 seconds.     Findings: No erythema or rash.  Neurological:     Mental Status: He is alert and oriented to person, place, and time.     Sensory: No sensory deficit.     Motor: No weakness or abnormal muscle tone.     Coordination: Coordination normal.     Gait: Gait normal.  Psychiatric:        Behavior: Behavior normal.      ED Treatments / Results  Labs (all labs ordered are listed, but only abnormal results are displayed) Labs Reviewed  BASIC METABOLIC PANEL - Abnormal; Notable for the following components:      Result Value   Glucose, Bld 107 (*)    All other components within normal limits  CBC WITH DIFFERENTIAL/PLATELET    EKG None  Radiology No results found.  Procedures Procedures (including critical care time)  Medications Ordered in ED Medications  sodium chloride 0.9 % bolus 1,000 mL (0 mLs Intravenous Stopped 11/12/18 1627)  ketorolac (TORADOL) 30 MG/ML injection 30 mg (30 mg Intravenous Given 11/12/18 1351)  dexamethasone (DECADRON) injection 10 mg (10 mg Intravenous Given 11/12/18 1351)  prochlorperazine (COMPAZINE) injection 10 mg (10 mg Intravenous Given 11/12/18 1351)     Initial Impression / Assessment and Plan / ED Course  I have reviewed the triage vital signs and the nursing notes.  Pertinent labs & imaging results that were available during my care of the patient were reviewed by me and considered in my medical decision making (see chart for  details).        Patient does not have any neurological deficits noted on examination.  The patient has been stable here in the emergency department.  He was given IV fluids along with headache medications.  Patient does not have any concerning symptoms with this headache as it is been gradual onset over 7 days.  Patient does not have any fevers or signs of meningismus at this time.  Patient said no neurological impairment in his extremities or visual changes.  Patient be reassessed following medications and laboratory testing.  Final Clinical Impressions(s) / ED Diagnoses   Final diagnoses:  Acute nonintractable headache, unspecified headache type    ED Discharge Orders    None       Dalia Heading, PA-C 11/19/18 1124    Davonna Belling, MD 11/23/18 [phone]

## 2018-12-24 ENCOUNTER — Ambulatory Visit: Payer: Medicaid Other | Attending: Internal Medicine | Admitting: Internal Medicine

## 2018-12-24 ENCOUNTER — Other Ambulatory Visit: Payer: Self-pay

## 2018-12-24 ENCOUNTER — Encounter: Payer: Self-pay | Admitting: Internal Medicine

## 2018-12-24 VITALS — BP 109/73 | HR 89 | Temp 99.3°F | Resp 16 | Wt 140.8 lb

## 2018-12-24 DIAGNOSIS — M545 Low back pain, unspecified: Secondary | ICD-10-CM

## 2018-12-24 DIAGNOSIS — G8929 Other chronic pain: Secondary | ICD-10-CM | POA: Diagnosis not present

## 2018-12-24 DIAGNOSIS — Z0000 Encounter for general adult medical examination without abnormal findings: Secondary | ICD-10-CM | POA: Diagnosis not present

## 2018-12-24 DIAGNOSIS — E785 Hyperlipidemia, unspecified: Secondary | ICD-10-CM

## 2018-12-24 DIAGNOSIS — G629 Polyneuropathy, unspecified: Secondary | ICD-10-CM | POA: Insufficient documentation

## 2018-12-24 DIAGNOSIS — Z125 Encounter for screening for malignant neoplasm of prostate: Secondary | ICD-10-CM

## 2018-12-24 DIAGNOSIS — M4726 Other spondylosis with radiculopathy, lumbar region: Secondary | ICD-10-CM | POA: Insufficient documentation

## 2018-12-24 DIAGNOSIS — Z79899 Other long term (current) drug therapy: Secondary | ICD-10-CM | POA: Insufficient documentation

## 2018-12-24 DIAGNOSIS — Z87891 Personal history of nicotine dependence: Secondary | ICD-10-CM | POA: Diagnosis not present

## 2018-12-24 DIAGNOSIS — K219 Gastro-esophageal reflux disease without esophagitis: Secondary | ICD-10-CM | POA: Diagnosis not present

## 2018-12-24 DIAGNOSIS — G894 Chronic pain syndrome: Secondary | ICD-10-CM | POA: Diagnosis not present

## 2018-12-24 DIAGNOSIS — D170 Benign lipomatous neoplasm of skin and subcutaneous tissue of head, face and neck: Secondary | ICD-10-CM | POA: Diagnosis not present

## 2018-12-24 MED ORDER — TRAMADOL HCL 50 MG PO TABS: 50.0000 mg | ORAL_TABLET | Freq: Three times a day (TID) | ORAL | 0 refills | Status: DC | PRN

## 2018-12-24 NOTE — Progress Notes (Signed)
Patient ID: Nathaniel Robinson, male    DOB: 11-13-1955  MRN: [id number]  CC: Annual Exam  Subjective: Nathaniel Robinson is a 62 y.o. male who presents for annual exam.  Last visit 09/2018 His concerns today include:  Pt with hx of HL, chronic LBP, GERD,peripheral neuropathy, former smoker (quit spring 2020).  HL:  Pt has been on Lipitor for over 2 yrs now.  Wants to know if he still needs to continue the med.  Tolerating med okay  GERD: pt states he is doing better and feels he can now stop the protonix  Chronic LBP:  Request RF on Tramadol which he takes for LBP.  No significant SEs from the tramadol.  He states that he does not have to take it every day but when he does take it it helps with his back.  He is functional and able to do his activities of daily living.  However not able to get in as much exercise as he would like due to his back issues.  He states that the most he can walk is about for 10 to 15 minutes before having to sit.  HM: He is up-to-date with his vaccines and colon cancer screening.  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 90  tablet prn   hydrocortisone valerate cream (WESTCORT) 0.2 % Apply small amount to rectal area every other day as needed 45 g 0   No current facility-administered medications on file prior to visit.     No Known Allergies  Social History   Socioeconomic History   Marital status: Married    Spouse name: Not on file   Number of children: 4   Years of education: come college   Highest education level: Not on file  Occupational History   Occupation: disability    Comment: [place]   Social Designer, fashion/clothing strain: Not on file   Food insecurity    Worry: Not on file    Inability: Not on file   Transportation needs    Medical: Not on file    Non-medical: Not on file  Tobacco Use   Smoking status: Former Smoker    Types: Cigarettes   Smokeless tobacco: Never Used  Substance and Sexual Activity   Alcohol use: Yes    Alcohol/week: 0.0 standard drinks    Comment: social    Drug use: No   Sexual activity: Never  Lifestyle   Physical activity    Days per week: Not on file    Minutes per session: Not on file   Stress: Not on file  Relationships   Social connections    Talks on phone: Not on file  Gets together: Not on file    Attends religious service: Not on file    Active member of club or organization: Not on file    Attends meetings of clubs or organizations: Not on file    Relationship status: Not on file   Intimate partner violence    Fear of current or ex partner: Not on file    Emotionally abused: Not on file    Physically abused: Not on file    Forced sexual activity: Not on file  Other Topics Concern   Not on file  Social History Narrative   Form [place]   Moved to [place] in 01/2014    Lives with roommate.   Right-handed.   No caffeine use.   Wife and 4 children in [place].     Family History  Problem Relation Age of Onset   Ovarian cancer Mother    Other Father        unsure of medical history   Heart disease Neg Hx      Hypertension Neg Hx    Diabetes Neg Hx    Colon cancer Neg Hx     Past Surgical History:  Procedure Laterality Date   COLONOSCOPY N/A 05/15/2015   Procedure: COLONOSCOPY;  Surgeon: Danie Binder, MD;  Location: [facility];  Service: Endoscopy;  Laterality: N/A;  145 - moved to 1:30 - office to notify    ROS: Review of Systems  Constitutional: Negative for activity change, fatigue and fever.  HENT: Negative for hearing loss and sore throat.   Eyes: Negative for visual disturbance.  Respiratory: Negative for cough and shortness of breath.   Cardiovascular: Negative for chest pain.  Gastrointestinal: Negative for abdominal distention.  Genitourinary: Negative for difficulty urinating and hematuria.  Neurological: Negative for dizziness and headaches.  Psychiatric/Behavioral: Negative for dysphoric mood and sleep disturbance. The patient is not nervous/anxious.     PHYSICAL EXAM: BP 109/73    Pulse 89    Temp 99.3 F (37.4 C) (Oral)    Resp 16    Wt 140 lb 12.8 oz (63.9 kg)    SpO2 96%    BMI 23.43 kg/m   Wt Readings from Last 3 Encounters:  12/24/18 140 lb 12.8 oz (63.9 kg)  09/14/18 142 lb (64.4 kg)  07/26/18 142 lb (64.4 kg)    Physical Exam  General appearance - alert, well appearing, and in no distress Mental status - normal mood, behavior, speech, dress, motor activity, and thought processes Eyes - pupils equal and reactive, extraocular eye movements intact Ears - bilateral TM's and external ear canals normal Nose - normal and patent, no erythema, discharge or polyps Mouth - mucous membranes moist, pharynx normal without lesions Neck - supple, no significant adenopathy Lymphatics - no palpable lymphadenopathy, no hepatosplenomegaly Chest - clear to auscultation, no wheezes, rales or rhonchi, symmetric air entry Heart - normal rate, regular rhythm, normal S1, S2, no murmurs, rubs, clicks or gallops Abdomen - soft, nontender, nondistended, no masses or  organomegaly Musculoskeletal -no tenderness on palpation of the lumbar spine.  Gait is steady.   Extremities - peripheral pulses normal, no pedal edema, no clubbing or cyanosis Skin -patient has a 2 cm soft movable mass at the hairline posterior neck right side.  Patient has had this for a while and states it has not increased in size  CMP Latest Ref Rng & Units 11/12/2018 01/11/2018 01/02/2018  Glucose 70 - 99 mg/dL 107(H) 102(H) -  BUN 8 -  23 mg/dL 13 10 -  Creatinine 0.61 - 1.24 mg/dL 0.71 0.71 -  Sodium 135 - 145 mmol/L 140 136 -  Potassium 3.5 - 5.1 mmol/L 4.2 4.2 -  Chloride 98 - 111 mmol/L 108 104 -  CO2 22 - 32 mmol/L 23 22 -  Calcium 8.9 - 10.3 mg/dL 8.9 9.1 -  Total Protein 6.0 - 8.5 g/dL - - 6.6  Total Bilirubin 0.0 - 1.2 mg/dL - - -  Alkaline Phos 39 - 117 IU/L - - -  AST 0 - 40 IU/L - - -  ALT 0 - 44 IU/L - - -   Lipid Panel     Component Value Date/Time   CHOL 130 10/31/2017 1236   TRIG 162 (H) 10/31/2017 1236   HDL 45 10/31/2017 1236   CHOLHDL 2.9 10/31/2017 1236   CHOLHDL 3.5 03/10/2016 1412   VLDL 25 03/10/2016 1412   LDLCALC 53 10/31/2017 1236    CBC    Component Value Date/Time   WBC 5.6 11/12/2018 1227   RBC 4.77 11/12/2018 1227   HGB 15.7 11/12/2018 1227   HGB 15.4 10/31/2017 1236   HCT 45.9 11/12/2018 1227   HCT 45.1 10/31/2017 1236   PLT 209 11/12/2018 1227   PLT 239 10/31/2017 1236   MCV 96.2 11/12/2018 1227   MCV 94 10/31/2017 1236   MCH 32.9 11/12/2018 1227   MCHC 34.2 11/12/2018 1227   RDW 13.1 11/12/2018 1227   RDW 13.2 10/31/2017 1236   LYMPHSABS 2.9 11/12/2018 1227   MONOABS 0.5 11/12/2018 1227   EOSABS 0.1 11/12/2018 1227   BASOSABS 0.0 11/12/2018 1227    ASSESSMENT AND PLAN: 1. Annual physical exam .  Is up-to-date with colon cancer screening and with age-appropriate vaccines.  He is agreeable to having PSA checked today for prostate cancer screening  2. Hyperlipidemia, unspecified hyperlipidemia type We discussed and gave him  the option of stopping Lipitor and rechecking cholesterol in about 6 months to see whether he is able to maintain normal cholesterol with healthy eating habits and physical activity.  Patient did not want to do that so we will continue the Lipitor for now which he is tolerating. - Lipid panel - Hepatic Function Panel  3. Chronic left-sided low back pain without sciatica Patient taking and tolerating tramadol as needed for back pain.  He reports benefit from taking the medication.  He is functional.  There is no signs of aberrant activity.  [place] controlled substance reporting system reviewed --I forgot to update his controlled substance prescribing agreement which expired in May of this year.  We will do that on a follow-up visit - traMADol (ULTRAM) 50 MG tablet; Take 1 tablet (50 mg total) by mouth every 8 (eight) hours as needed for moderate pain.  Dispense: 60 tablet; Refill: 0  4. Lipoma of neck No significant increase in size.  Observe for now  5. Gastroesophageal reflux disease without esophagitis Stop Protonix.  Advised patient to avoid foods that can cause flare and acid reflux.  6. Prostate cancer screening - PSA     Patient was given the opportunity to ask questions.  Patient verbalized understanding of the plan and was able to repeat key elements of the plan.  [employer] interpreter used during this encounter.[id number]  Orders Placed This Encounter  Procedures   Lipid panel   Hepatic Function Panel   PSA     Requested Prescriptions   Signed Prescriptions Disp Refills   traMADol (ULTRAM) 50 MG tablet  60 tablet 0    Sig: Take 1 tablet (50 mg total) by mouth every 8 (eight) hours as needed for moderate pain.    Return in about 5 months (around 05/24/2019).  Karle Plumber, MD, FACP

## 2018-12-25 ENCOUNTER — Ambulatory Visit: Payer: Medicaid Other | Admitting: Internal Medicine

## 2018-12-25 LAB — LIPID PANEL
Chol/HDL Ratio: 2.6 ratio (ref 0.0–5.0)
Cholesterol, Total: 138 mg/dL (ref 100–199)
HDL: 53 mg/dL (ref >39)
LDL Chol Calc (NIH): 64 mg/dL (ref 0–99)
Triglycerides: 117 mg/dL (ref 0–149)
VLDL Cholesterol Cal: 21 mg/dL (ref 5–40)

## 2018-12-25 LAB — HEPATIC FUNCTION PANEL
ALT: 12 IU/L (ref 0–44)
AST: 19 IU/L (ref 0–40)
Albumin: 4.1 g/dL (ref 3.8–4.8)
Alkaline Phosphatase: 54 IU/L (ref 39–117)
Bilirubin Total: 0.3 mg/dL (ref 0.0–1.2)
Bilirubin, Direct: 0.09 mg/dL (ref 0.00–0.40)
Total Protein: 6.1 g/dL (ref 6.0–8.5)

## 2018-12-25 LAB — PSA: Prostate Specific Ag, Serum: 6.4 ng/mL — ABNORMAL HIGH (ref 0.0–4.0)

## 2018-12-26 ENCOUNTER — Telehealth: Payer: Self-pay

## 2018-12-26 ENCOUNTER — Other Ambulatory Visit: Payer: Self-pay | Admitting: Internal Medicine

## 2018-12-26 ENCOUNTER — Encounter: Payer: Self-pay | Admitting: Internal Medicine

## 2018-12-26 DIAGNOSIS — R972 Elevated prostate specific antigen [PSA]: Secondary | ICD-10-CM | POA: Insufficient documentation

## 2018-12-26 NOTE — Telephone Encounter (Signed)
Contacted pt to go over lab results pt is aware and doesn't have any questions or concerns 

## 2019-02-05 DIAGNOSIS — R972 Elevated prostate specific antigen [PSA]: Secondary | ICD-10-CM | POA: Diagnosis not present

## 2019-02-05 DIAGNOSIS — N402 Nodular prostate without lower urinary tract symptoms: Secondary | ICD-10-CM | POA: Diagnosis not present

## 2019-02-21 ENCOUNTER — Encounter: Payer: Medicaid Other | Attending: Physical Medicine & Rehabilitation | Admitting: Physical Medicine & Rehabilitation

## 2019-02-21 ENCOUNTER — Encounter: Payer: Self-pay | Admitting: Physical Medicine & Rehabilitation

## 2019-02-21 ENCOUNTER — Other Ambulatory Visit: Payer: Self-pay

## 2019-02-21 VITALS — BP 109/74 | HR 86 | Temp 97.7°F | Ht 65.0 in | Wt 138.0 lb

## 2019-02-21 DIAGNOSIS — M47816 Spondylosis without myelopathy or radiculopathy, lumbar region: Secondary | ICD-10-CM | POA: Insufficient documentation

## 2019-02-21 NOTE — Progress Notes (Signed)
Subjective:    Patient ID: Nathaniel Robinson, male    DOB: [birth year], 64 y.o.   MRN: [id number] 02/27/2018 RightL5 dorsal ramus., Right L4 and Right L3 medial branch radio frequency neurotomy under fluoroscopic guidance   09/14/2018 Left L5 dorsal ramus., left L4 and left L3 medial branch radio frequency neurotomy under fluoroscopic guidance  Patient here with in person interpreter HPI L>R mild low back pain at this point it is moderate.  The patient is very happy with his response to the radiofrequency neurotomy.  He has had no falls or trauma.  He remains independent with all his self-care and mobility.  He does not require an assistive device Not using any prescription medications for pain.  Did receive a prescription for tramadol from primary care at the beginning of November for 60 tablets.  Pain Inventory Average Pain 5 Pain Right Now 4 My pain is intermittent  In the last 24 hours, has pain interfered with the following? General activity 5 Relation with others 5 Enjoyment of life 5 What TIME of day is your pain at its worst? morning, daytime  Sleep (in general) Fair  Pain is worse with: walking, bending, standing and some activites Pain improves with: rest, medication and injections Relief from Meds: 8  Mobility walk without assistance how many minutes can you walk? 30 ability to climb steps?  no do you drive?  no transfers alone  Function disabled: date disabled .  Neuro/Psych No problems in this area  Prior Studies Any changes since last visit?  no  Physicians involved in your care Any changes since last visit?  no   Family History  Problem Relation Age of Onset  . Ovarian cancer Mother   . Other Father        unsure of medical history  . Heart disease Neg Hx   . Hypertension Neg Hx   . Diabetes Neg Hx   . Colon cancer Neg Hx    Social History   Socioeconomic History  . Marital status: Married    Spouse name: Not on file  . Number of children: 4   . Years of education: come college  . Highest education level: Not on file  Occupational History  . Occupation: disability    Comment: [place]   Tobacco Use  . Smoking status: Former Smoker    Types: Cigarettes  . Smokeless tobacco: Never Used  Substance and Sexual Activity  . Alcohol use: Yes    Alcohol/week: 0.0 standard drinks    Comment: social   . Drug use: No  . Sexual activity: Never  Other Topics Concern  . Not on file  Social History Narrative   Form [place]   Moved to [place] in 01/2014    Lives with roommate.   Right-handed.   No caffeine use.   Wife and 4 children in [place].    Social Determinants of Health   Financial Resource Strain:   . Difficulty of Paying Living Expenses: Not on file  Food Insecurity:   . Worried About Charity fundraiser in the Last Year: Not on file  . Ran Out of Food in the Last Year: Not on file  Transportation Needs:   . Lack of Transportation (Medical): Not on file  . Lack of Transportation (Non-Medical): Not on file  Physical Activity:   . Days of Exercise per Week: Not on file  . Minutes of Exercise per Session: Not on file  Stress:   . Feeling of Stress :  Not on file  Social Connections:   . Frequency of Communication with Friends and Family: Not on file  . Frequency of Social Gatherings with Friends and Family: Not on file  . Attends Religious Services: Not on file  . Active Member of Clubs or Organizations: Not on file  . Attends Archivist Meetings: Not on file  . Marital Status: Not on file   Past Surgical History:  Procedure Laterality Date  . COLONOSCOPY N/A 05/15/2015   Procedure: COLONOSCOPY;  Surgeon: Danie Binder, MD;  [facility];  Service: Endoscopy;  Laterality: N/A;  145 - moved to 1:30 - office to notify   Past Medical History:  Diagnosis Date  . Chronic low back pain 2010   . GERD (gastroesophageal reflux disease)    BP 109/74   Pulse 86   Temp 97.7 F (36.5 C)   Ht 5\' 5"   (1.651 m)   Wt 138 lb (62.6 kg)   SpO2 97%   BMI 22.96 kg/m   Opioid Risk Score:   Fall Risk Score:  `1  Depression screen PHQ 2/9  Depression screen [facility] 2/9 02/27/2018 01/30/2018 10/31/2017 09/15/2017 07/04/2017 06/15/2017 04/24/2017  Decreased Interest 0 0 0 0 0 0 0  Down, Depressed, Hopeless 0 0 0 0 0 0 0  PHQ - 2 Score 0 0 0 0 0 0 0  Altered sleeping - - - - - - -  Tired, decreased energy - - - - - - -  Change in appetite - - - - - - -  Feeling bad or failure about yourself  - - - - - - -  Trouble concentrating - - - - - - -  Moving slowly or fidgety/restless - - - - - - -  Suicidal thoughts - - - - - - -  PHQ-9 Score - - - - - - -  Some recent data might be hidden    Review of Systems  Constitutional: Negative.   HENT: Negative.   Eyes: Negative.   Respiratory: Negative.   Cardiovascular: Negative.   Gastrointestinal: Negative.   Endocrine: Negative.   Genitourinary: Negative.   Musculoskeletal: Negative.   Skin: Negative.   Allergic/Immunologic: Negative.   Neurological: Negative.   Hematological: Negative.   Psychiatric/Behavioral: Negative.   All other systems reviewed and are negative.      Objective:   Physical Exam Vitals and nursing note reviewed.  Constitutional:      Appearance: Normal appearance.  HENT:     Head: Normocephalic and atraumatic.  Eyes:     Extraocular Movements: Extraocular movements intact.     Conjunctiva/sclera: Conjunctivae normal.     Pupils: Pupils are equal, round, and reactive to light.  Musculoskeletal:     Thoracic back: Normal.     Lumbar back: Tenderness present. No deformity or spasms. Normal range of motion. Negative right straight leg raise test and negative left straight leg raise test. No scoliosis.  Neurological:     General: No focal deficit present.     Mental Status: He is alert and oriented to person, place, and time. Mental status is at baseline.  Psychiatric:        Mood and Affect: Mood normal.         Behavior: Behavior normal.           Assessment & Plan:  1.  Lumbar spondylosis without myelopathy with good improvements following radiofrequency neurotomies as noted below.  He is independent with  all self-care mobility using minimal medication   Approximately 1 year post RightL5 dorsal ramus., Right L4 and Right L3 medial branch radio frequency neurotomy under fluoroscopic guidance   Approximately 6 months post Left L5 dorsal ramus., left L4 and left L3 medial branch radio frequency neurotomy under fluoroscopic guidance   He is starting to develop some symptoms on the left side but not at the point where he is requiring a repeat procedure.  His right side is still doing quite well. Discussed with the patient as well as interpreter that if his pain level start climbing we would schedule him for repeat procedure most likely on the left side since that appears to be the most symptomatic at the current time.  We would repeatLeft L5 dorsal ramus., left L4 and left L3 medial branch radio frequency neurotomy under fluoroscopic guidance   Patient is to call if his pain level increases

## 2019-02-21 NOTE — Patient Instructions (Signed)
PLEASE CALL IF YOUR PAIN GETS BACK TO 5/10 OR MORE

## 2019-02-27 DIAGNOSIS — R972 Elevated prostate specific antigen [PSA]: Secondary | ICD-10-CM | POA: Diagnosis not present

## 2019-02-27 DIAGNOSIS — N411 Chronic prostatitis: Secondary | ICD-10-CM | POA: Diagnosis not present

## 2019-03-06 DIAGNOSIS — R972 Elevated prostate specific antigen [PSA]: Secondary | ICD-10-CM | POA: Diagnosis not present

## 2019-03-06 DIAGNOSIS — N402 Nodular prostate without lower urinary tract symptoms: Secondary | ICD-10-CM | POA: Diagnosis not present

## 2019-03-12 IMAGING — CR DG HIP (WITH OR WITHOUT PELVIS) 3-4V BILAT
5 series · 5 of 5 positions shown · non-contrast
Comparison: None.

CLINICAL DATA: Low back pain radiating to both legs.

EXAM:
DG HIP (WITH OR WITHOUT PELVIS) 3-4V BILAT

[t pelvis ap]
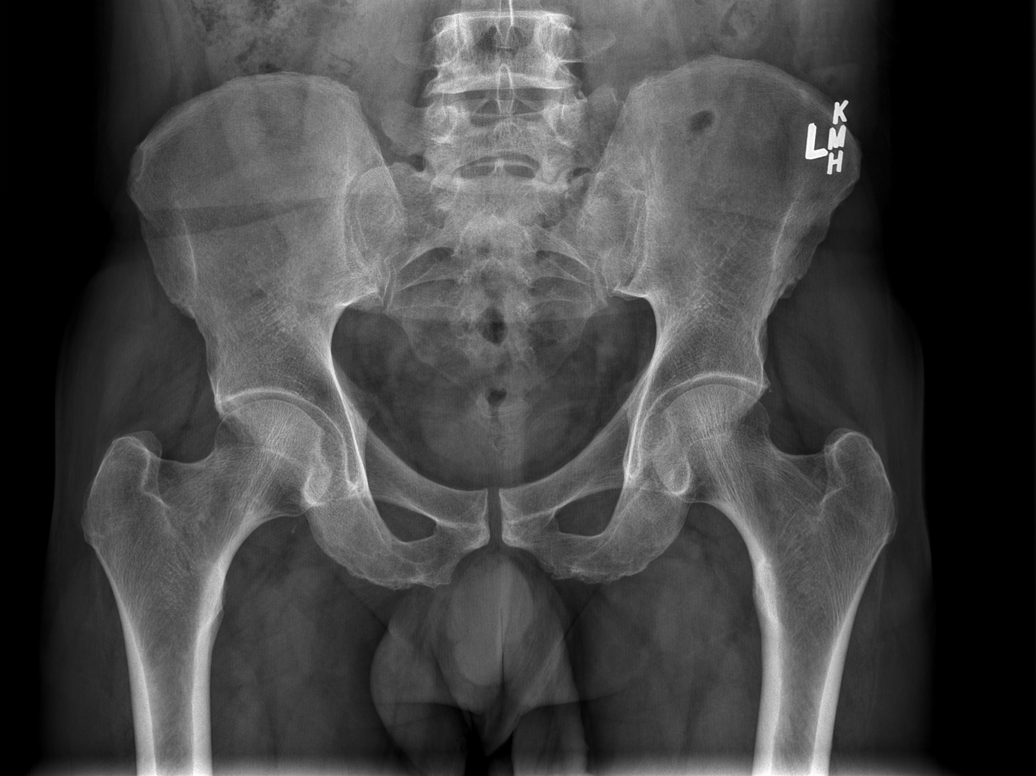

[t hip ap left]
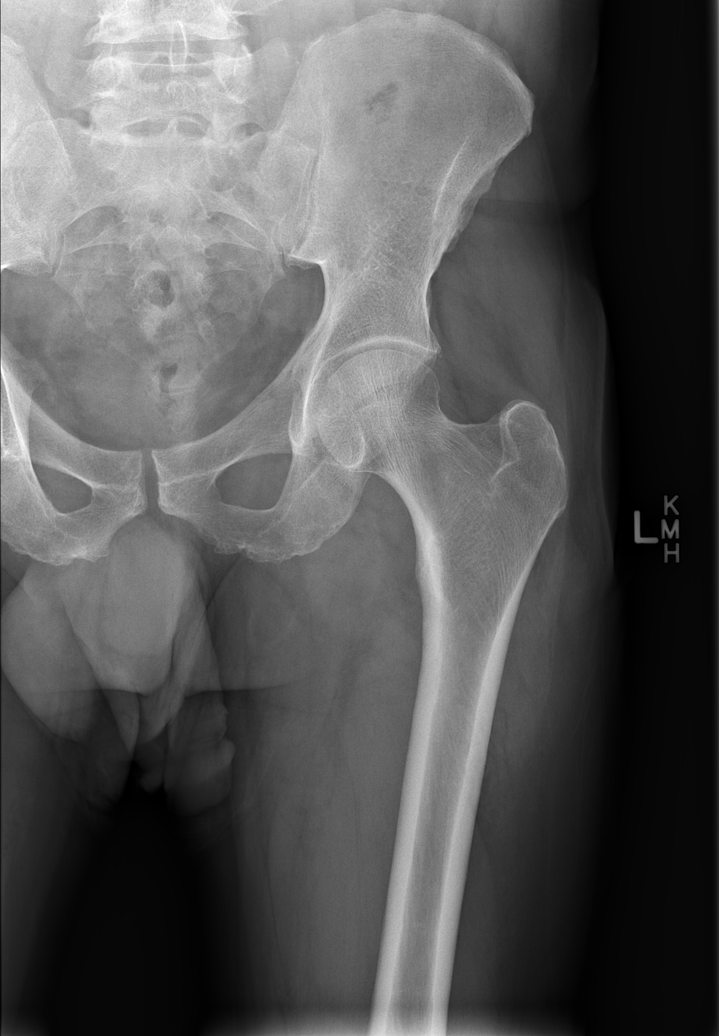

[t hip ap right]
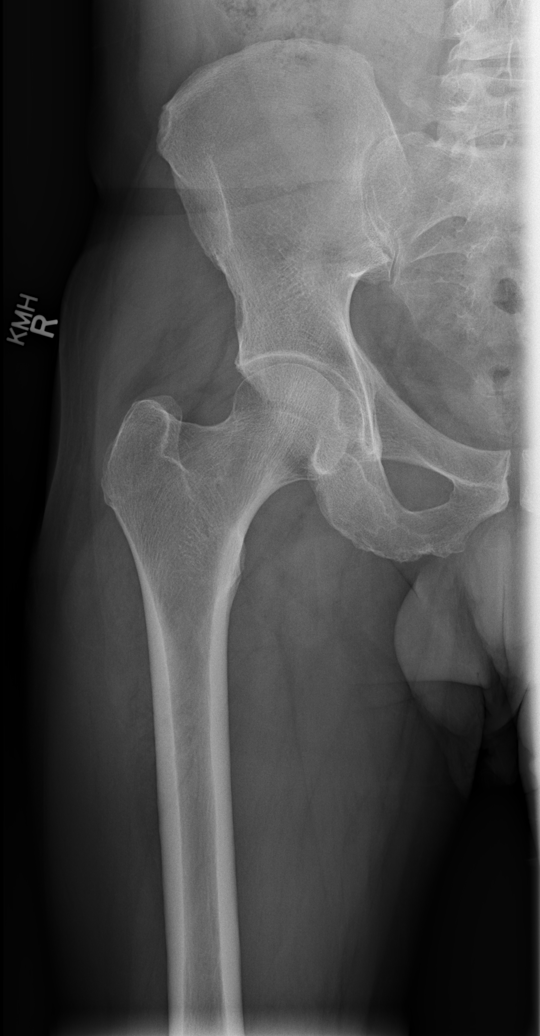

[t hip frog leg right]
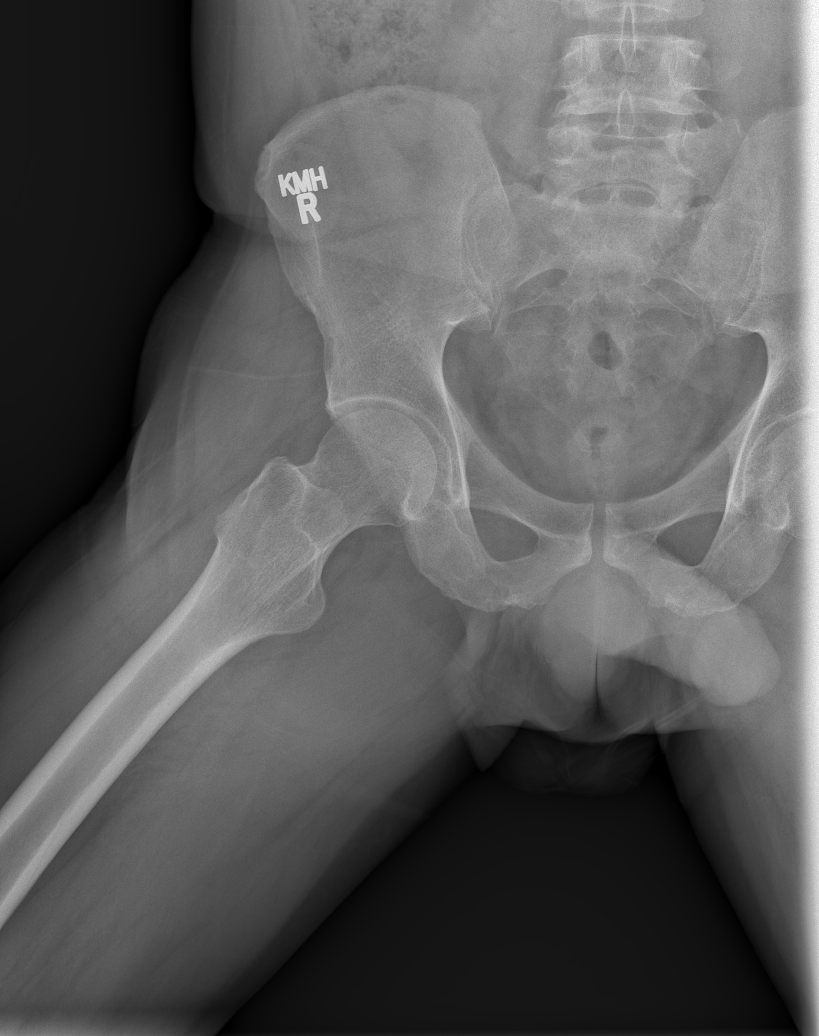

[t hip frog leg left]
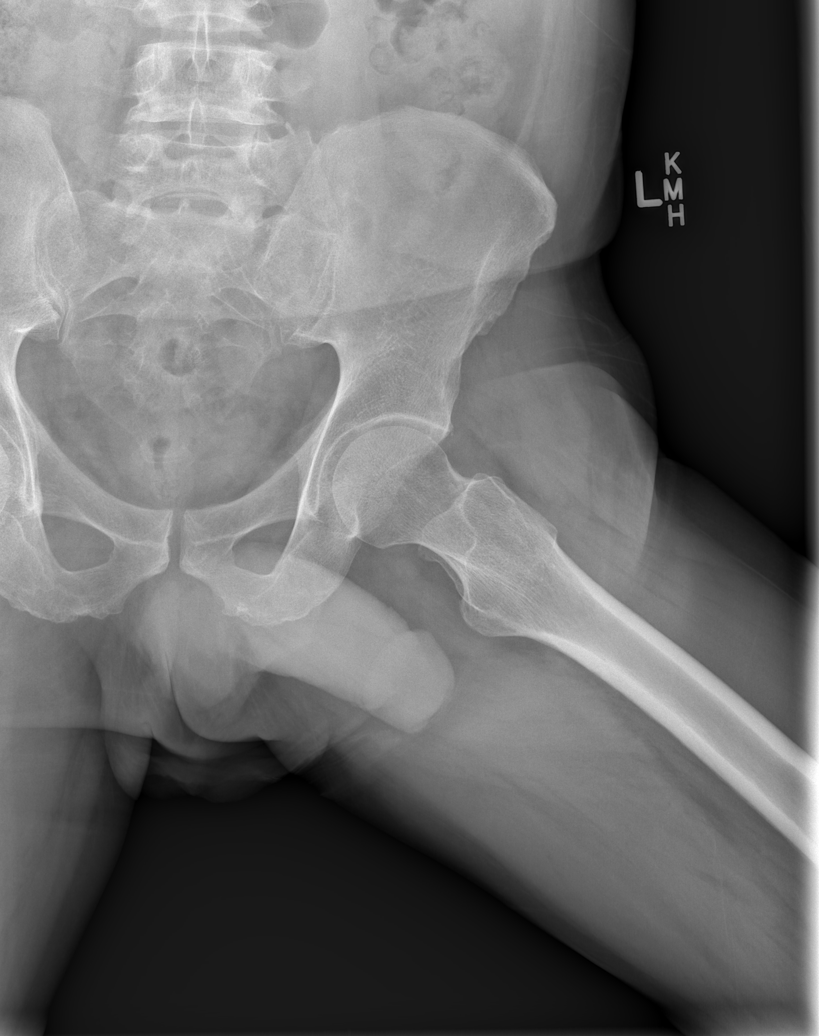

[5 of 5 positions shown; findings below may reference images not displayed]

FINDINGS: Bones the pelvis appear normal. Both hip joints appear normal. No
joint space narrowing or other degenerative change. No sign of
avascular necrosis. Sacroiliac joints are normal. Lowest vertebra is
partially transitional.
IMPRESSION: Negative radiographs.  No cause of pain identified.

## 2019-04-30 ENCOUNTER — Encounter: Payer: Medicaid Other | Admitting: Physical Medicine & Rehabilitation

## 2019-05-02 ENCOUNTER — Encounter: Payer: Medicaid Other | Admitting: Physical Medicine & Rehabilitation

## 2019-05-13 ENCOUNTER — Ambulatory Visit: Payer: Medicaid Other

## 2019-05-13 ENCOUNTER — Ambulatory Visit: Payer: Medicaid Other | Attending: Internal Medicine

## 2019-05-13 DIAGNOSIS — Z23 Encounter for immunization: Secondary | ICD-10-CM

## 2019-05-13 NOTE — Progress Notes (Signed)
   Covid-19 Vaccination Clinic  Name:  Nathaniel Robinson    MRN: [id number] DOB: 1955/03/06  05/13/2019  [name] was observed post Covid-19 immunization for 15 minutes without incident. He was provided with Vaccine Information Sheet and instruction to access the V-Safe system.   [name] was instructed to call 911 with any severe reactions post vaccine: Marland Kitchen Difficulty breathing  . Swelling of face and throat  . A fast heartbeat  . A bad rash all over body  . Dizziness and weakness

## 2019-05-16 ENCOUNTER — Other Ambulatory Visit: Payer: Self-pay | Admitting: Internal Medicine

## 2019-05-16 DIAGNOSIS — G8929 Other chronic pain: Secondary | ICD-10-CM

## 2019-05-16 DIAGNOSIS — M545 Low back pain, unspecified: Secondary | ICD-10-CM

## 2019-05-24 ENCOUNTER — Encounter: Payer: Self-pay | Admitting: Internal Medicine

## 2019-05-24 ENCOUNTER — Other Ambulatory Visit: Payer: Self-pay

## 2019-05-24 ENCOUNTER — Ambulatory Visit: Payer: Medicaid Other | Attending: Internal Medicine | Admitting: Internal Medicine

## 2019-05-24 VITALS — BP 120/76 | HR 79 | Temp 97.7°F | Resp 16 | Wt 138.4 lb

## 2019-05-24 DIAGNOSIS — G8929 Other chronic pain: Secondary | ICD-10-CM

## 2019-05-24 DIAGNOSIS — J988 Other specified respiratory disorders: Secondary | ICD-10-CM | POA: Diagnosis not present

## 2019-05-24 DIAGNOSIS — Z87891 Personal history of nicotine dependence: Secondary | ICD-10-CM | POA: Insufficient documentation

## 2019-05-24 DIAGNOSIS — B349 Viral infection, unspecified: Secondary | ICD-10-CM | POA: Diagnosis not present

## 2019-05-24 DIAGNOSIS — E785 Hyperlipidemia, unspecified: Secondary | ICD-10-CM | POA: Insufficient documentation

## 2019-05-24 DIAGNOSIS — M4726 Other spondylosis with radiculopathy, lumbar region: Secondary | ICD-10-CM | POA: Insufficient documentation

## 2019-05-24 DIAGNOSIS — N402 Nodular prostate without lower urinary tract symptoms: Secondary | ICD-10-CM | POA: Insufficient documentation

## 2019-05-24 DIAGNOSIS — R972 Elevated prostate specific antigen [PSA]: Secondary | ICD-10-CM | POA: Diagnosis not present

## 2019-05-24 DIAGNOSIS — M545 Low back pain: Secondary | ICD-10-CM | POA: Insufficient documentation

## 2019-05-24 DIAGNOSIS — G894 Chronic pain syndrome: Secondary | ICD-10-CM | POA: Insufficient documentation

## 2019-05-24 DIAGNOSIS — K219 Gastro-esophageal reflux disease without esophagitis: Secondary | ICD-10-CM | POA: Diagnosis not present

## 2019-05-24 DIAGNOSIS — B9789 Other viral agents as the cause of diseases classified elsewhere: Secondary | ICD-10-CM | POA: Diagnosis not present

## 2019-05-24 DIAGNOSIS — G609 Hereditary and idiopathic neuropathy, unspecified: Secondary | ICD-10-CM | POA: Insufficient documentation

## 2019-05-24 MED ORDER — BENZONATATE 100 MG PO CAPS: 100.0000 mg | ORAL_CAPSULE | Freq: Three times a day (TID) | ORAL | 0 refills | Status: DC | PRN

## 2019-05-24 MED ORDER — ALBUTEROL SULFATE HFA 108 (90 BASE) MCG/ACT IN AERS: 2.0000 | INHALATION_SPRAY | Freq: Four times a day (QID) | RESPIRATORY_TRACT | 0 refills | Status: DC | PRN

## 2019-05-24 NOTE — Progress Notes (Signed)
Patient ID: Nathaniel Robinson, male    DOB: 04-21-1955  MRN: [id number]  CC: Back Pain and Cough   Subjective: Nathaniel Robinson is a 64 y.o. male who presents for chronic disease management His concerns today include:  Pt with hx of HL, chronic LBP, GERD,peripheral neuropathy, former smoker (quit spring 2020).  C/o mostly dry cough and nasal mucus x 10 days.  No fever, sore throat, SOB, facial pain, body aches, itchy throat or eyes, wheezing. Had sneezing initially. +Lack of taste x 1 wk but it is improving. No recent sick exposures. Lives with a roommate.  Received COVID-19 J&J vaccine 05/13/19.  Reports similar symptoms every winter and summer.     Since last visit with me, patient was referred to urology for elevated PSA of 6.4.  He saw [facility] and had prostate biopsy done which was negative for cancer.  He did have a prostate nodule.  They plan to recheck him with PSA and digital rectal exam in 6 months.  Lumbar spondylosis: Saw Dr. Letta Pate 02/2019.  He was reporting some increased pain in the left lower back.  Reports pain level 5/10 today.  He was offered repeat procedure in the form of radiofrequency neurotomy under fluoroscopic guidance. Scheduled for next mth. -reports Tramadol still helps.  Takes one a day; last dose this morning.  No adverse S.E  Patient Active Problem List   Diagnosis Date Noted  . Elevated PSA 12/26/2018  . Flu vaccine need 10/08/2018  . Former smoker 09/24/2018  . Spondylosis without myelopathy or radiculopathy, lumbar region 09/14/2018  . Tobacco dependence 06/01/2018  . Paresthesia 01/02/2018  . Acute midline thoracic back pain 09/15/2017  . Gastroesophageal reflux disease without esophagitis 09/15/2017  . Idiopathic peripheral neuropathy 09/15/2017  . Chronic lumbar radiculopathy 02/10/2017  . Hyperlipidemia 03/14/2016  . Lumbar degenerative disc disease 09/11/2015  . Chronic pain syndrome 09/11/2015  . 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 Former heavy cigarette smoker (20-39 per day) 03/17/2014  . Chronic low back pain      Current Outpatient Medications on File Prior to Visit  Medication Sig Dispense Refill  . atorvastatin (LIPITOR) 20 MG tablet Take 1 tablet (20 mg total) by mouth daily. 90 tablet prn  . hydrocortisone valerate cream (WESTCORT) 0.2 % Apply small amount to rectal area every other day as needed 45 g 0  . traMADol (ULTRAM) 50 MG tablet TAKE 1 TABLET(50 MG) BY MOUTH EVERY 8 HOURS AS NEEDED FOR MODERATE PAIN 60 tablet 0   No current facility-administered medications on file prior to visit.    No Known Allergies  Social History   Socioeconomic History  . Marital status: Married    Spouse name: Not on file  . Number of children: 4  . Years of education: come college  . Highest education level: Not on file  Occupational History  . Occupation: disability    Comment: [place]   Tobacco Use  . Smoking status: Former Smoker    Types: Cigarettes  . Smokeless tobacco: Never Used  Substance and Sexual Activity  . Alcohol use: Yes    Alcohol/week: 0.0 standard drinks    Comment: social   . Drug use: No  . Sexual activity: Never  Other Topics Concern  . Not on file  Social History Narrative   Form [place]   Moved to [place] in 01/2014    Lives with roommate.   Right-handed.   No caffeine use.  Wife and 4 children in [place].    Social Determinants of Health   Financial Resource Strain:   . Difficulty of Paying Living Expenses:   Food Insecurity:   . Worried About Charity fundraiser in the Last Year:   . Arboriculturist in the Last Year:   Transportation Needs:   . Film/video editor (Medical):   Marland Kitchen Lack of Transportation (Non-Medical):   Physical Activity:   . Days of Exercise per Week:   . Minutes of Exercise per Session:   Stress:   . Feeling of Stress :   Social Connections:   . Frequency of Communication with Friends and Family:   . Frequency of  Social Gatherings with Friends and Family:   . Attends Religious Services:   . Active Member of Clubs or Organizations:   . Attends Archivist Meetings:   Marland Kitchen Marital Status:   Intimate Partner Violence:   . Fear of Current or Ex-Partner:   . Emotionally Abused:   Marland Kitchen Physically Abused:   . Sexually Abused:     Family History  Problem Relation Age of Onset  . Ovarian cancer Mother   . Other Father        unsure of medical history  . Heart disease Neg Hx   . Hypertension Neg Hx   . Diabetes Neg Hx   . Colon cancer Neg Hx     Past Surgical History:  Procedure Laterality Date  . COLONOSCOPY N/A 05/15/2015   Procedure: COLONOSCOPY;  Surgeon: Danie Binder, MD;  [facility];  Service: Endoscopy;  Laterality: N/A;  145 - moved to 1:30 - office to notify    ROS: Review of Systems Negative except as stated above  PHYSICAL EXAM: BP 120/76   Pulse 79   Temp 97.7 F (36.5 C)   Resp 16   Wt 138 lb 6.4 oz (62.8 kg)   SpO2 99%   BMI 23.03 kg/m   Physical Exam  General appearance - alert, well appearing, and in no distress Mental status - normal mood, behavior, speech, dress, motor activity, and thought processes Eyes - pupils equal and reactive, extraocular eye movements intact Nose -small amount of clear mucus in both nostrils. Mouth - mucous membranes moist, pharynx normal without lesions Neck - supple, no significant adenopathy Chest -some scattered wheezing at the bases with popping Heart - normal rate, regular rhythm, normal S1, S2, no murmurs, rubs, clicks or gallops Extremities - peripheral pulses normal, no pedal edema, no clubbing or cyanosis  CMP Latest Ref Rng & Units 12/24/2018 11/12/2018 01/11/2018  Glucose 70 - 99 mg/dL - 107(H) 102(H)  BUN 8 - 23 mg/dL - 13 10  Creatinine 0.61 - 1.24 mg/dL - 0.71 0.71  Sodium 135 - 145 mmol/L - 140 136  Potassium 3.5 - 5.1 mmol/L - 4.2 4.2  Chloride 98 - 111 mmol/L - 108 104  CO2 22 - 32 mmol/L - 23 22    Calcium 8.9 - 10.3 mg/dL - 8.9 9.1  Total Protein 6.0 - 8.5 g/dL 6.1 - -  Total Bilirubin 0.0 - 1.2 mg/dL 0.3 - -  Alkaline Phos 39 - 117 IU/L 54 - -  AST 0 - 40 IU/L 19 - -  ALT 0 - 44 IU/L 12 - -   Lipid Panel     Component Value Date/Time   CHOL 138 12/24/2018 1649   TRIG 117 12/24/2018 1649   HDL 53 12/24/2018 1649  CHOLHDL 2.6 12/24/2018 1649   CHOLHDL 3.5 03/10/2016 1412   VLDL 25 03/10/2016 1412   LDLCALC 64 12/24/2018 1649    CBC    Component Value Date/Time   WBC 5.6 11/12/2018 1227   RBC 4.77 11/12/2018 1227   HGB 15.7 11/12/2018 1227   HGB 15.4 10/31/2017 1236   HCT 45.9 11/12/2018 1227   HCT 45.1 10/31/2017 1236   PLT 209 11/12/2018 1227   PLT 239 10/31/2017 1236   MCV 96.2 11/12/2018 1227   MCV 94 10/31/2017 1236   MCH 32.9 11/12/2018 1227   MCHC 34.2 11/12/2018 1227   RDW 13.1 11/12/2018 1227   RDW 13.2 10/31/2017 1236   LYMPHSABS 2.9 11/12/2018 1227   MONOABS 0.5 11/12/2018 1227   EOSABS 0.1 11/12/2018 1227   BASOSABS 0.0 11/12/2018 1227    ASSESSMENT AND PLAN: 1. Viral respiratory illness -Patient with respiratory symptoms for which we need to rule out Covid even though he has had vaccine.  We will do testing today.  Went over [employer] guidelines regarding quarantining until we get the results of his test back.  I have prescribed some Tessalon Perles and Ventolin inhaler to use as needed.  I went over how to use the inhaler. -Advised him to isolate in a separate room from his roommate until we get the results of Covid testing - Novel Coronavirus, NAA ([facility]) - benzonatate (TESSALON PERLES) 100 MG capsule; Take 1 capsule (100 mg total) by mouth 3 (three) times daily as needed for cough.  Dispense: 20 capsule; Refill: 0 - albuterol (VENTOLIN HFA) 108 (90 Base) MCG/ACT inhaler; Inhale 2 puffs into the lungs every 6 (six) hours as needed for wheezing or shortness of breath.  Dispense: 6.8 g; Refill: 0  2. Chronic left-sided low back pain without  sciatica Continue tramadol as needed.  He will keep his follow-up appointment with Dr. Letta Pate. Plan was to update controlled substance prescribing agreement today but we will do that on next visit as I wanted to limit exposure of other employees in the event that patient has Covid. [id number] 11+Oxyco+Alc+Crt-Bund  3. Elevated PSA Prostate biopsy was negative.  Plan for follow-up with urology in 6 months  4. Hyperlipidemia, unspecified hyperlipidemia type Stable on Lipitor.    Patient was given the opportunity to ask questions.  Patient verbalized understanding of the plan and was able to repeat key elements of the plan.  [employer] used during this encounter. [id number]   No orders of the defined types were placed in this encounter.    Requested Prescriptions    No prescriptions requested or ordered in this encounter    No follow-ups on file.  Karle Plumber, MD, FACP

## 2019-05-24 NOTE — Patient Instructions (Signed)
3 Key Steps to Take While Waiting for Your COVID-19 Test Result °To help stop the spread of COVID-19, take these 3 key steps NOW while waiting for your test results: °1. Stay home and monitor your health. °Stay home and monitor your health to help protect your friends, family, and others from possibly getting COVID-19 from you. °Stay home and away from others: °· If possible, stay away from others, especially people who are at higher risk for getting very sick from COVID-19, such as older adults and people with other medical conditions. °· If you have been in contact with someone with COVID-19, stay home and away from others for 14 days after your last contact with that person. °· If you have a fever, cough or other symptoms of COVID-19, stay home and away from others (except to get medical care). °Monitor your health: °· Watch for fever, cough, shortness of breath, or other symptoms of COVID-19. Remember, symptoms may appear 2-14 days after exposure to COVID-19 and can include: °? Fever or chills °? Cough °? Shortness of breath or difficulty breathing °? Tiredness °? Muscle or body aches °? Headache °? New loss of taste or smell °? Sore throat °? Congestion or runny nose °? Nausea or vomiting °? Diarrhea °2. Think about the people you have recently been around. °If you are diagnosed with COVID-19, a public health worker may call you to check on your health, discuss who you have been around, and ask where you spent time while you may have been able to spread COVID-19 to others. While you wait for your COVID-19 test result, think about everyone you have been around recently. This will be important information to give health workers if your test is positive.  °Complete the information on the back of this page to help you remember everyone you have been around. °3. Answer the phone call from the health department. °If a public health worker calls you, answer the call to help slow the spread of COVID-19 in your  community. °· Discussions with health department staff are confidential. This means that your personal and medical information will be kept private and only shared with those who may need to know, like your health care provider. °· Your name will not be shared with those you came in contact with. The health department will only notify people you were in close contact with (within 6 feet for more than 15 minutes) that they might have been exposed to COVID-19. °Think about the people you have recently been around °If you test positive and are diagnosed with COVID-19, someone from the health department may call to check-in on your health, discuss who you have been around, and ask where you spent time while you may have been able to spread COVID-19 to others. This form can help you think about people you have recently been around so you will be ready if a public health worker calls you. °Things to think about. Have you: °· Gone to work or school? °· Gotten together with others (eaten out at a restaurant, gone out for drinks, exercised with others or gone to a gym, had friends or family over to your house, volunteered, gone to a party, pool, or park)? °· Gone to a store in person (e.g., grocery store, mall)? °· Gone to in-person appointments (e.g., salon, barber, doctor's or dentist's office)? °· Ridden in a car with others (e.g., Uber or Lyft) or took public transportation? °· Been inside a church, synagogue, mosque or other places   of worship? °Who lives with you? °· ______________________________________________________________________ °· ______________________________________________________________________ °· ______________________________________________________________________ °· ______________________________________________________________________ °Who have you been around (within 6 feet for more than 15 minutes) in the last 10 days? (You may have more people to list than the space provided. If so, write on the  front of this sheet or a separate piece of paper.) °Name ______________________________________________ °· Phone number ____________________________________ °· Date you last saw them _____________________________ °· Where you last saw them ________________________________________________ °Name ______________________________________________ °· Phone number ____________________________________ °· Date you last saw them _____________________________ °· Where you last saw them ________________________________________________ °Name ______________________________________________ °· Phone number ____________________________________ °· Date you last saw them _____________________________ °· Where you last saw them ________________________________________________ °Name ______________________________________________ °· Phone number ____________________________________ °· Date you last saw them _____________________________ °· Where you last saw them ________________________________________________ °Name ______________________________________________ °· Phone number ____________________________________ °· Date you last saw them _____________________________ °· Where you last saw them ________________________________________________ °What have you done in the last 10 days with other people? °Activity _____________________________________________ °· Location _________________________________________ °· Date ____________________________________________ °Activity _____________________________________________ °· Location _________________________________________ °· Date ____________________________________________ °Activity _____________________________________________ °· Location _________________________________________ °· Date ____________________________________________ °Activity _____________________________________________ °· Location _________________________________________ °· Date  ____________________________________________ °Activity _____________________________________________ °· Location _________________________________________ °· Date ____________________________________________ °cdc.gov/coronavirus °09/10/2018 °This information is not intended to replace advice given to you by your health care provider. Make sure you discuss any questions you have with your health care provider. °Document Revised: 01/17/2019 Document Reviewed: 01/17/2019 °Elsevier Patient Education © 2020 [employer] ° °

## 2019-05-26 LAB — SARS-COV-2, NAA 2 DAY TAT

## 2019-05-26 LAB — NOVEL CORONAVIRUS, NAA: SARS-CoV-2, NAA: NOT DETECTED

## 2019-05-26 NOTE — Progress Notes (Signed)
Let pt know that his COVID test was negative.

## 2019-05-27 ENCOUNTER — Telehealth: Payer: Self-pay

## 2019-05-27 NOTE — Telephone Encounter (Signed)
Contacted pt to go over COVID results pt is aware of results and doesn't have any questions or concerns

## 2019-05-28 ENCOUNTER — Ambulatory Visit: Payer: Medicaid Other | Admitting: Physical Medicine & Rehabilitation

## 2019-05-28 ENCOUNTER — Ambulatory Visit: Payer: Medicaid Other | Admitting: Internal Medicine

## 2019-05-29 LAB — DRUG SCREEN 764883 11+OXYCO+ALC+CRT-BUND
Amphetamines, Urine: NEGATIVE ng/mL
BENZODIAZ UR QL: NEGATIVE ng/mL
Barbiturate: NEGATIVE ng/mL
Cannabinoid Quant, Ur: NEGATIVE ng/mL
Cocaine (Metabolite): NEGATIVE ng/mL
Creatinine: 111.2 mg/dL (ref 20.0–300.0)
Ethanol: NEGATIVE %
Meperidine: NEGATIVE ng/mL
Methadone Screen, Urine: NEGATIVE ng/mL
OPIATE SCREEN URINE: NEGATIVE ng/mL
Oxycodone/Oxymorphone, Urine: NEGATIVE ng/mL
Phencyclidine: NEGATIVE ng/mL
Propoxyphene: NEGATIVE ng/mL
pH, Urine: 5.5 (ref 4.5–8.9)

## 2019-05-29 LAB — TRAMADOL GC/MS, URINE
Tramadol gc/ms Conf: >10000 ng/mL
Tramadol: POSITIVE — AB

## 2019-06-17 ENCOUNTER — Other Ambulatory Visit: Payer: Self-pay | Admitting: Internal Medicine

## 2019-06-17 DIAGNOSIS — J988 Other specified respiratory disorders: Secondary | ICD-10-CM

## 2019-06-17 DIAGNOSIS — B9789 Other viral agents as the cause of diseases classified elsewhere: Secondary | ICD-10-CM

## 2019-06-27 ENCOUNTER — Encounter: Payer: Medicaid Other | Attending: Physical Medicine & Rehabilitation | Admitting: Physical Medicine & Rehabilitation

## 2019-06-27 ENCOUNTER — Encounter: Payer: Self-pay | Admitting: Physical Medicine & Rehabilitation

## 2019-06-27 ENCOUNTER — Other Ambulatory Visit: Payer: Self-pay

## 2019-06-27 VITALS — BP 113/76 | HR 88 | Temp 97.7°F | Ht 65.0 in | Wt 140.0 lb

## 2019-06-27 DIAGNOSIS — M47816 Spondylosis without myelopathy or radiculopathy, lumbar region: Secondary | ICD-10-CM

## 2019-06-27 NOTE — Patient Instructions (Signed)

## 2019-06-27 NOTE — Progress Notes (Signed)
  PROCEDURE RECORD [facility]   Name: Kipp Honer DOB:02/18/1955 MRN: [id number]  Date:06/27/2019  Physician: Alysia Penna, MD    Nurse/CMA: Chaney Ingram, CMA   Allergies: No Known Allergies  Consent Signed: Yes.    Is patient diabetic? No.  CBG today?   Pregnant: No. LMP: No LMP for male patient. (age 63-55)  Anticoagulants: no Anti-inflammatory: no Antibiotics: no  Procedure: left L3,4,5 radiofrequency neurotomy  Position: Prone Start Time: 9:38am  End Time: 9:50am  Fluoro Time: 29s  RN/CMA Estel Tonelli, CMA Harman Ferrin, CMA    Time 9:20am 9:54am    BP 113/76 120/77    Pulse 88 83    Respirations 14 14    O2 Sat 96 97    S/S 14 14    Pain Level 6/10 5/10     D/C home with public transportation, patient A & O X 3, D/C instructions reviewed, and sits independently.

## 2019-06-27 NOTE — Progress Notes (Signed)
Left L5 dorsal ramus., left L4 and left L3 medial branch radio frequency neurotomy under fluoroscopic guidance  Indication: Low back pain due to lumbar spondylosis which has been relieved on 2 occasions by greater than 50% by lumbar medial branch blocks at corresponding levels.  Informed consent was obtained after describing risks and benefits of the procedure with the patient, this includes bleeding, bruising, infection, paralysis and medication side effects. The patient wishes to proceed and has given written consent. The patient was placed in a prone position. The lumbar and sacral area was marked and prepped with Betadine. A 25-gauge 1-1/2 inch needle was inserted into the skin and subcutaneous tissue at 3 sites in one ML of 1% lidocaine was injected into each site. Then a 18-gauge 10 cm radio frequency needle with a 1 cm curved active tip was inserted targeting the left S1 SAP/sacral ala junction. Bone contact was made and confirmed with lateral imaging.  motor stimulation at 2 Hz confirm proper needle location followed by injection of one ML of 1% MPF lidocaine. Then the left L5 SAP/transverse process junction was targeted. Bone contact was made and confirmed with lateral imaging motor stimulation at 2 Hz confirm proper needle location followed by injection of one ML of the solution containing one ML of  1% MPF lidocaine. Then the left L4 SAP/transverse process junction was targeted. Bone contact was made and confirmed with lateral imaging. motor stimulation at 2 Hz confirm proper needle location followed by injection of one ML of the solution containing one ML of1% MPF lidocaine. Radio frequency lesion  at 80C for 90 seconds was performed. Needles were removed. Post procedure instructions and vital signs were performed. Patient tolerated procedure well. Followup appointment was given.  

## 2019-08-09 ENCOUNTER — Other Ambulatory Visit: Payer: Self-pay | Admitting: Internal Medicine

## 2019-08-09 DIAGNOSIS — E785 Hyperlipidemia, unspecified: Secondary | ICD-10-CM

## 2019-08-20 ENCOUNTER — Ambulatory Visit: Payer: Medicaid Other | Admitting: Physical Medicine & Rehabilitation

## 2019-09-01 ENCOUNTER — Other Ambulatory Visit: Payer: Self-pay | Admitting: Internal Medicine

## 2019-09-01 DIAGNOSIS — J988 Other specified respiratory disorders: Secondary | ICD-10-CM

## 2019-09-01 NOTE — Telephone Encounter (Signed)
Requested Prescriptions  Pending Prescriptions Disp Refills  . PROAIR HFA 108 (90 Base) MCG/ACT inhaler [Pharmacy Med Name: PROAIR HFA ORAL INH (200  PFS) 8.5G] 8.5 g 0    Sig: USE 2 PUFFS INTO THE LUNGS EVERY 6 HOURS AS NEEDED WHEEZING OR SHORTNESS OF BREATH     Pulmonology:  Beta Agonists Failed - 09/01/2019 11:13 AM      Failed - One inhaler should last at least one month. If the patient is requesting refills earlier, contact the patient to check for uncontrolled symptoms.      Passed - Valid encounter within last 12 months    Recent Outpatient Visits          3 months ago Viral respiratory illness   [place] Ladell Pier, MD   8 months ago Annual physical exam   [place] Ladell Pier, MD   11 months ago Chronic left-sided low back pain without sciatica   [facility] Ladell Pier, MD   1 year ago Tobacco dependence   [place] Ladell Pier, MD   1 year ago Chronic left-sided low back pain without sciatica   [facility] Ladell Pier, MD

## 2019-09-02 ENCOUNTER — Other Ambulatory Visit: Payer: Self-pay | Admitting: Internal Medicine

## 2019-09-02 DIAGNOSIS — M545 Low back pain, unspecified: Secondary | ICD-10-CM

## 2019-09-02 NOTE — Telephone Encounter (Signed)
Requested medications are due for refill today?   Yes - This refill cannot be delegated.    Requested medications are on active medication list?  Yes  Last Refill:   05/17/2019   # 60 with no refills   Future visit scheduled?  No   Notes to Clinic:  This Refill cannot be delegated.

## 2020-02-01 ENCOUNTER — Other Ambulatory Visit: Payer: Self-pay | Admitting: Internal Medicine

## 2020-02-01 DIAGNOSIS — M545 Low back pain, unspecified: Secondary | ICD-10-CM

## 2020-02-01 NOTE — Telephone Encounter (Signed)
Requested medication (s) are due for refill today: yes  Requested medication (s) are on the active medication list: yes  Last refill:  09/03/19  Future visit scheduled: no  Notes to clinic:  med not delegated to NT to RF   Requested Prescriptions  Pending Prescriptions Disp Refills   traMADol (ULTRAM) 50 MG tablet [Pharmacy Med Name: TRAMADOL 50MG  TABLETS] 60 tablet     Sig: TAKE 1 TABLET(50 MG) BY MOUTH EVERY 8 HOURS AS NEEDED FOR MODERATE PAIN      Not Delegated - Analgesics:  Opioid Agonists Failed - 02/01/2020 11:41 AM      Failed - This refill cannot be delegated      Failed - Valid encounter within last 6 months    Recent Outpatient Visits           8 months ago Viral respiratory illness   [place] Ladell Pier, MD   1 year ago Annual physical exam   [place] Ladell Pier, MD   1 year ago Chronic left-sided low back pain without sciatica   [place], Deborah B, MD   1 year ago Tobacco dependence   [place], MD   2 years ago Chronic left-sided low back pain without sciatica   [place], MD                Passed - Urine Drug Screen completed in last 360 days

## 2020-03-31 ENCOUNTER — Ambulatory Visit: Payer: Medicaid Other | Attending: Internal Medicine | Admitting: Internal Medicine

## 2020-03-31 ENCOUNTER — Other Ambulatory Visit: Payer: Self-pay

## 2020-03-31 ENCOUNTER — Encounter: Payer: Self-pay | Admitting: Internal Medicine

## 2020-03-31 VITALS — BP 110/70 | HR 74 | Resp 16 | Wt 146.6 lb

## 2020-03-31 DIAGNOSIS — M545 Low back pain, unspecified: Secondary | ICD-10-CM | POA: Diagnosis not present

## 2020-03-31 DIAGNOSIS — L290 Pruritus ani: Secondary | ICD-10-CM

## 2020-03-31 DIAGNOSIS — Z125 Encounter for screening for malignant neoplasm of prostate: Secondary | ICD-10-CM

## 2020-03-31 DIAGNOSIS — G8929 Other chronic pain: Secondary | ICD-10-CM | POA: Diagnosis not present

## 2020-03-31 DIAGNOSIS — R0602 Shortness of breath: Secondary | ICD-10-CM

## 2020-03-31 DIAGNOSIS — F17200 Nicotine dependence, unspecified, uncomplicated: Secondary | ICD-10-CM

## 2020-03-31 MED ORDER — TRAMADOL HCL 50 MG PO TABS: ORAL_TABLET | ORAL | 0 refills | Status: DC

## 2020-03-31 MED ORDER — NICOTINE 21 MG/24HR TD PT24: 21.0000 mg | MEDICATED_PATCH | Freq: Every day | TRANSDERMAL | 1 refills | Status: DC

## 2020-03-31 MED ORDER — HYDROCORTISONE VALERATE 0.2 % EX CREA: TOPICAL_CREAM | CUTANEOUS | 0 refills | Status: DC

## 2020-03-31 MED ORDER — ALBUTEROL SULFATE HFA 108 (90 BASE) MCG/ACT IN AERS: INHALATION_SPRAY | RESPIRATORY_TRACT | 0 refills | Status: DC

## 2020-03-31 NOTE — Progress Notes (Signed)
Patient ID: Mansoor Hillyard, male    DOB: 01-29-1956  MRN: [id number]  CC: Back Pain   Subjective: Exavier Lina is a 65 y.o. male who presents for chronic ds management.  Last seen 05/2019 His concerns today include:  Pt with hx of HL, chronic LBP, GERD,peripheral neuropathy,former smoker (quit spring 2020).  Chronic LBP due to lumbar spondylosis:  Last saw Dr. Letta Pate 06/27/19.  Had inj at that time the effects of which lasted 8 mths.  Pain has returned.  Has f/u with Dr. Letta Pate next mth.  Request RF on Tramadol in main time.   Also request RF on Hydrocortisone Valerate 0.2% cream which he uses as needed for rectal itching.  Complains of SOB sometimes when he walks a little longer than usual.  He can walk comfortably for 20 to 30 minutes.  If he tries to walk for 45 minutes, he noticed shortness of breath and wheezing.  No chest pains.  He also notices shortness of breath when he first lays down at night.  States that he props his pillow up under the head and it goes away.  He sleeps on one pillow.  Denies any PND.  No lower extremity edema.  Some cough intermittently which can be productive or nonproductive.  Had quit smoking 2020 but restarted 10 cig/day x 1 yr.  He is wanting to quit again.  HM: had flu vaccine [facility] early October.  Had only 1 J&J vaccine.  Due for booster Patient Active Problem List   Diagnosis Date Noted  . Elevated PSA 12/26/2018  . Flu vaccine need 10/08/2018  . Former smoker 09/24/2018  . Spondylosis without myelopathy or radiculopathy, lumbar region 09/14/2018  . Tobacco dependence 06/01/2018  . Paresthesia 01/02/2018  . Acute midline thoracic back pain 09/15/2017  . Gastroesophageal reflux disease without esophagitis 09/15/2017  . Idiopathic peripheral neuropathy 09/15/2017  . Chronic lumbar radiculopathy 02/10/2017  . Hyperlipidemia 03/14/2016  . Lumbar degenerative disc disease 09/11/2015  . Chronic pain syndrome 09/11/2015  . Sacroiliac  joint disease 08/12/2014  . 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 Former heavy cigarette smoker (20-39 per day) 03/17/2014  . Chronic low back pain      Current Outpatient Medications on File Prior to Visit  Medication Sig Dispense Refill  . atorvastatin (LIPITOR) 20 MG tablet TAKE 1 TABLET(20 MG) BY MOUTH DAILY 90 tablet prn   No current facility-administered medications on file prior to visit.    No Known Allergies  Social History   Socioeconomic History  . Marital status: Married    Spouse name: Not on file  . Number of children: 4  . Years of education: come college  . Highest education level: Not on file  Occupational History  . Occupation: disability    Comment: [place]   Tobacco Use  . Smoking status: Current Every Day Smoker    Packs/day: 0.50    Types: Cigarettes  . Smokeless tobacco: Never Used  Substance and Sexual Activity  . Alcohol use: Yes    Alcohol/week: 0.0 standard drinks    Comment: social   . Drug use: No  . Sexual activity: Never  Other Topics Concern  . Not on file  Social History Narrative   Form [place]   Moved to [place] in 01/2014    Lives with roommate.   Right-handed.   No caffeine use.   Wife and 4 children in [place].    Social Determinants of Health   Financial Resource Strain: Not on  file  Food Insecurity: Not on file  Transportation Needs: Not on file  Physical Activity: Not on file  Stress: Not on file  Social Connections: Not on file  Intimate Partner Violence: Not on file    Family History  Problem Relation Age of Onset  . Ovarian cancer Mother   . Other Father        unsure of medical history  . Heart disease Neg Hx   . Hypertension Neg Hx   . Diabetes Neg Hx   . Colon cancer Neg Hx     Past Surgical History:  Procedure Laterality Date  . COLONOSCOPY N/A 05/15/2015   Procedure: COLONOSCOPY;  Surgeon: Danie Binder, MD;  Location: [facility];  Service: Endoscopy;  Laterality: N/A;  145 - moved to 1:30 - office to  notify    ROS: Review of Systems Negative except as stated above  PHYSICAL EXAM: BP 110/70   Pulse 74   Resp 16   Wt 146 lb 9.6 oz (66.5 kg)   SpO2 96%   BMI 24.40 kg/m   Wt Readings from Last 3 Encounters:  03/31/20 146 lb 9.6 oz (66.5 kg)  06/27/19 140 lb (63.5 kg)  05/24/19 138 lb 6.4 oz (62.8 kg)    Physical Exam  General appearance - alert, well appearing, and in no distress Mental status - normal mood, behavior, speech, dress, motor activity, and thought processes Mouth - mucous membranes moist, pharynx normal without lesions Neck - supple, no significant adenopathy Chest -mild diffuse expiratory wheezes. Heart - normal rate, regular rhythm, normal S1, S2, no murmurs, rubs, clicks or gallops Musculoskeletal -no tenderness on palpation of the lumbar spine.   Extremities - peripheral pulses normal, no pedal edema, no clubbing or cyanosis  CMP Latest Ref Rng & Units 12/24/2018 11/12/2018 01/11/2018  Glucose 70 - 99 mg/dL - 107(H) 102(H)  BUN 8 - 23 mg/dL - 13 10  Creatinine 0.61 - 1.24 mg/dL - 0.71 0.71  Sodium 135 - 145 mmol/L - 140 136  Potassium 3.5 - 5.1 mmol/L - 4.2 4.2  Chloride 98 - 111 mmol/L - 108 104  CO2 22 - 32 mmol/L - 23 22  Calcium 8.9 - 10.3 mg/dL - 8.9 9.1  Total Protein 6.0 - 8.5 g/dL 6.1 - -  Total Bilirubin 0.0 - 1.2 mg/dL 0.3 - -  Alkaline Phos 39 - 117 IU/L 54 - -  AST 0 - 40 IU/L 19 - -  ALT 0 - 44 IU/L 12 - -   Lipid Panel     Component Value Date/Time   CHOL 138 12/24/2018 1649   TRIG 117 12/24/2018 1649   HDL 53 12/24/2018 1649   CHOLHDL 2.6 12/24/2018 1649   CHOLHDL 3.5 03/10/2016 1412   VLDL 25 03/10/2016 1412   LDLCALC 64 12/24/2018 1649    CBC    Component Value Date/Time   WBC 5.6 11/12/2018 1227   RBC 4.77 11/12/2018 1227   HGB 15.7 11/12/2018 1227   HGB 15.4 10/31/2017 1236   HCT 45.9 11/12/2018 1227   HCT 45.1 10/31/2017 1236   PLT 209 11/12/2018 1227   PLT 239 10/31/2017 1236   MCV 96.2 11/12/2018 1227   MCV 94  10/31/2017 1236   MCH 32.9 11/12/2018 1227   MCHC 34.2 11/12/2018 1227   RDW 13.1 11/12/2018 1227   RDW 13.2 10/31/2017 1236   LYMPHSABS 2.9 11/12/2018 1227   MONOABS 0.5 11/12/2018 1227   EOSABS 0.1 11/12/2018 1227  BASOSABS 0.0 11/12/2018 1227    ASSESSMENT AND PLAN: 1. Chronic left-sided low back pain without sciatica We will give a refill on tramadol to use as needed.  [place] controlled substance reporting system reviewed.  Last urine drug screen was in April 2021. - traMADol (ULTRAM) 50 MG tablet; TAKE 1 TABLET(50 MG) BY MOUTH EVERY 8 HOURS AS NEEDED FOR MODERATE PAIN  Dispense: 60 tablet; Refill: 0  2. Rectal itching - hydrocortisone valerate cream (WESTCORT) 0.2 %; Apply small amount to rectal area every other day as needed  Dispense: 45 g; Refill: 0  3. SOB (shortness of breath) Differential diagnoses include asthma versus COPD, CHF (but less likely given physical exam findings).  We will get a chest x-ray and given albuterol inhaler.  We will have him see the clinical pharmacist tomorrow to be instructed on how to use the inhaler properly.  Check BNP.  Advised to quit smoking.  Follow-up with me in 6 weeks. - CBC; Future - Comprehensive metabolic panel; Future - DG Chest 2 View; Future - albuterol (PROAIR HFA) 108 (90 Base) MCG/ACT inhaler; USE 2 PUFFS INTO THE LUNGS EVERY 6 HOURS AS NEEDED WHEEZING OR SHORTNESS OF BREATH  Dispense: 8.5 g; Refill: 0 - Brain natriuretic peptide  4. Tobacco dependence Advised to quit.  Discussed health risks associated with smoking.  Patient wanting to give a trial of quitting again.  He use the nicotine patches in the past and found them to be helpful.  He is willing to try it again.  We will start him with the 21 mg patch.  I have instructed him on how to use it.  Less than 5 minutes spent on counseling. - Lipid panel; Future - nicotine (NICODERM CQ - DOSED IN MG/24 HOURS) 21 mg/24hr patch; Place 1 patch (21 mg total) onto the skin  daily.  Dispense: 28 patch; Refill: 1  5. Prostate cancer screening - PSA; Future  Advised to get the J&J booster or the [place] booster.  He plans to get from his pharmacy.  Patient was given the opportunity to ask questions.  Patient verbalized understanding of the plan and was able to repeat key elements of the plan.  [employer] interpreter used during this encounter. Mariam [id number]  Orders Placed This Encounter  Procedures  . DG Chest 2 View  . CBC  . Comprehensive metabolic panel  . Lipid panel  . PSA  . Brain natriuretic peptide     Requested Prescriptions   Signed Prescriptions Disp Refills  . hydrocortisone valerate cream (WESTCORT) 0.2 % 45 g 0    Sig: Apply small amount to rectal area every other day as needed  . traMADol (ULTRAM) 50 MG tablet 60 tablet 0    Sig: TAKE 1 TABLET(50 MG) BY MOUTH EVERY 8 HOURS AS NEEDED FOR MODERATE PAIN  . albuterol (PROAIR HFA) 108 (90 Base) MCG/ACT inhaler 8.5 g 0    Sig: USE 2 PUFFS INTO THE LUNGS EVERY 6 HOURS AS NEEDED WHEEZING OR SHORTNESS OF BREATH  . nicotine (NICODERM CQ - DOSED IN MG/24 HOURS) 21 mg/24hr patch 28 patch 1    Sig: Place 1 patch (21 mg total) onto the skin daily.    Return in about 6 weeks (around 05/12/2020) for Give appt with [facility] tomorrow to be taught use of inhaler.  Karle Plumber, MD, FACP

## 2020-04-01 ENCOUNTER — Ambulatory Visit: Payer: Medicaid Other | Attending: Internal Medicine

## 2020-04-01 ENCOUNTER — Ambulatory Visit (HOSPITAL_COMMUNITY)
Admission: RE | Admit: 2020-04-01 | Discharge: 2020-04-01 | Disposition: A | Discharge status: Home / Self Care | Payer: Medicaid Other | Source: Ambulatory Visit | Attending: Internal Medicine | Admitting: Internal Medicine

## 2020-04-01 DIAGNOSIS — Z125 Encounter for screening for malignant neoplasm of prostate: Secondary | ICD-10-CM

## 2020-04-01 DIAGNOSIS — R0602 Shortness of breath: Secondary | ICD-10-CM

## 2020-04-01 DIAGNOSIS — F17200 Nicotine dependence, unspecified, uncomplicated: Secondary | ICD-10-CM

## 2020-04-01 DIAGNOSIS — J189 Pneumonia, unspecified organism: Secondary | ICD-10-CM | POA: Diagnosis not present

## 2020-04-02 LAB — COMPREHENSIVE METABOLIC PANEL
ALT: 26 IU/L (ref 0–44)
AST: 24 IU/L (ref 0–40)
Albumin/Globulin Ratio: 1.7 (ref 1.2–2.2)
Albumin: 4.2 g/dL (ref 3.8–4.8)
Alkaline Phosphatase: 67 IU/L (ref 44–121)
BUN/Creatinine Ratio: 13 (ref 10–24)
BUN: 9 mg/dL (ref 8–27)
Bilirubin Total: 0.4 mg/dL (ref 0.0–1.2)
CO2: 26 mmol/L (ref 20–29)
Calcium: 9.5 mg/dL (ref 8.6–10.2)
Chloride: 102 mmol/L (ref 96–106)
Creatinine, Ser: 0.69 mg/dL — ABNORMAL LOW (ref 0.76–1.27)
GFR calc Af Amer: 117 mL/min/{1.73_m2} (ref >59)
GFR calc non Af Amer: 101 mL/min/{1.73_m2} (ref >59)
Globulin, Total: 2.5 g/dL (ref 1.5–4.5)
Glucose: 106 mg/dL — ABNORMAL HIGH (ref 65–99)
Potassium: 4.2 mmol/L (ref 3.5–5.2)
Sodium: 141 mmol/L (ref 134–144)
Total Protein: 6.7 g/dL (ref 6.0–8.5)

## 2020-04-02 LAB — LIPID PANEL
Chol/HDL Ratio: 2.5 ratio (ref 0.0–5.0)
Cholesterol, Total: 142 mg/dL (ref 100–199)
HDL: 56 mg/dL (ref >39)
LDL Chol Calc (NIH): 62 mg/dL (ref 0–99)
Triglycerides: 137 mg/dL (ref 0–149)
VLDL Cholesterol Cal: 24 mg/dL (ref 5–40)

## 2020-04-02 LAB — CBC
Hematocrit: 48.5 % (ref 37.5–51.0)
Hemoglobin: 15.9 g/dL (ref 13.0–17.7)
MCH: 32.4 pg (ref 26.6–33.0)
MCHC: 32.8 g/dL (ref 31.5–35.7)
MCV: 99 fL — ABNORMAL HIGH (ref 79–97)
Platelets: 217 10*3/uL (ref 150–450)
RBC: 4.91 x10E6/uL (ref 4.14–5.80)
RDW: 12.2 % (ref 11.6–15.4)
WBC: 7.6 10*3/uL (ref 3.4–10.8)

## 2020-04-02 LAB — PSA: Prostate Specific Ag, Serum: 6.2 ng/mL — ABNORMAL HIGH (ref 0.0–4.0)

## 2020-04-02 NOTE — Progress Notes (Signed)
Let patient know that his PSA level which is the screening test for prostate cancer is still mildly elevated but stable compared to 1 year ago.  Please inquire when was the last time he had followed up with the urologist.  Cholesterol level normal.  Kidney and liver function tests normal.  Blood cell counts including red blood cell, white blood cell and platelet counts are normal.

## 2020-04-04 ENCOUNTER — Other Ambulatory Visit: Payer: Self-pay | Admitting: Internal Medicine

## 2020-04-04 DIAGNOSIS — R0602 Shortness of breath: Secondary | ICD-10-CM

## 2020-04-04 DIAGNOSIS — R918 Other nonspecific abnormal finding of lung field: Secondary | ICD-10-CM

## 2020-04-04 MED ORDER — PREDNISONE 20 MG PO TABS: 20.0000 mg | ORAL_TABLET | Freq: Every day | ORAL | 0 refills | Status: DC

## 2020-04-04 MED ORDER — AZITHROMYCIN 250 MG PO TABS: ORAL_TABLET | ORAL | 0 refills | Status: DC

## 2020-04-04 NOTE — Progress Notes (Signed)
Let patient know that his chest x-ray revealed some abnormal findings in the lower lungs on both sides.  This may represent pneumonia but given that his symptoms have been going on a while, it could be something different.  The radiologist recommended getting a CAT scan of the chest for further evaluation.  I have submitted the order.  Please schedule for him.  I have also sent a prescription to his pharmacy for some antibiotics and prednisone.

## 2020-04-06 ENCOUNTER — Telehealth: Payer: Self-pay

## 2020-04-06 NOTE — Telephone Encounter (Signed)
[place]  [phone]   contacted pt to go over lab results pt is aware of results. Pt is aware and doesn't have any questions or concerns    Pt states he last saw urologist last year.

## 2020-04-06 NOTE — Telephone Encounter (Signed)
[place]  [phone]   contacted pt to go over xray results pt is aware and doesn't have any questions   CT is schedule for March 3 at 3pm at [facility]. Pt is to arrive at 230pm. Liquids on,y 4 hours prior

## 2020-04-15 ENCOUNTER — Telehealth: Payer: Self-pay | Admitting: Internal Medicine

## 2020-04-15 NOTE — Telephone Encounter (Signed)
Went on the [place] care website to start prior auth    CPT- 71260 CT Chest w/ Contrast ICD- R06.02 SOB         R91.8 Abnormality of lung on cxr              Provided all clinical information   Per website will need to fax clinical information    [place] and spoke to Dvon M. Intake rep to get fax number  Case# [id number]  Will fax all clinical information

## 2020-04-15 NOTE — Telephone Encounter (Signed)
Copied from [place] [phone]. Topic: General - Other >> Apr 14, 2020  3:59 PM Alanda Slim E wrote: Reason for CRM: Pt is scheduled on 3.3.22 for CT scan and his insurance is requiring authorization / please advise Fax# [phone]

## 2020-04-16 ENCOUNTER — Ambulatory Visit (HOSPITAL_COMMUNITY): Payer: Medicaid Other

## 2020-04-20 ENCOUNTER — Encounter: Payer: Self-pay | Admitting: Internal Medicine

## 2020-04-20 NOTE — Telephone Encounter (Signed)
Received fax from [place] has been approved  Authorization #[id number] Effective-04/20/20 Expires 06/04/20

## 2020-04-23 ENCOUNTER — Encounter: Payer: Medicaid Other | Attending: Physical Medicine & Rehabilitation | Admitting: Physical Medicine & Rehabilitation

## 2020-04-23 ENCOUNTER — Other Ambulatory Visit: Payer: Self-pay

## 2020-04-23 ENCOUNTER — Other Ambulatory Visit: Payer: Self-pay | Admitting: Internal Medicine

## 2020-04-23 ENCOUNTER — Encounter: Payer: Self-pay | Admitting: Physical Medicine & Rehabilitation

## 2020-04-23 VITALS — BP 135/73 | HR 86 | Temp 97.7°F | Ht 65.0 in | Wt 146.0 lb

## 2020-04-23 DIAGNOSIS — M47816 Spondylosis without myelopathy or radiculopathy, lumbar region: Secondary | ICD-10-CM | POA: Insufficient documentation

## 2020-04-23 DIAGNOSIS — R0602 Shortness of breath: Secondary | ICD-10-CM

## 2020-04-23 NOTE — Telephone Encounter (Signed)
Requested Prescriptions  Pending Prescriptions Disp Refills  . albuterol (PROAIR HFA) 108 (90 Base) MCG/ACT inhaler [Pharmacy Med Name: PROAIR HFA ORAL INH (200  PFS) 8.5G] 8.5 g 0    Sig: INHALE 2 PUFFS INTO THE LUNGS EVERY 6 HOURS AS NEEDED FOR WHEEZING OR SHORTNESS OF BREATH     Pulmonology:  Beta Agonists Failed - 04/23/2020  3:24 AM      Failed - One inhaler should last at least one month. If the patient is requesting refills earlier, contact the patient to check for uncontrolled symptoms.      Passed - Valid encounter within last 12 months    Recent Outpatient Visits          3 weeks ago Chronic left-sided low back pain without sciatica   [place], MD   11 months ago Viral respiratory illness   [place] Ladell Pier, MD   1 year ago Annual physical exam   [place] Ladell Pier, MD   1 year ago Chronic left-sided low back pain without sciatica   [place], Deborah B, MD   1 year ago Tobacco dependence   [place], MD      Future Appointments            In 3 weeks Ladell Pier, MD [place]

## 2020-04-23 NOTE — Patient Instructions (Signed)
You had a radio frequency procedure today This was done to alleviate joint pain in your lumbar area We injected lidocaine which is a local anesthetic.  You may experience soreness at the injection sites. You may also experienced some irritation of the nerves that were heated I'm recommending ice for 30 minutes every 2 hours as needed for the next 24-48 hours   

## 2020-04-23 NOTE — Progress Notes (Signed)
  PROCEDURE RECORD [facility]   Name: Nathaniel Robinson DOB:Jul 13, 1955 MRN: [id number]  Date:04/23/2020  Physician: Alysia Penna, MD    Nurse/CMA: Wessling, CMA  Allergies: No Known Allergies  Consent Signed: Yes.    Is patient diabetic? No.  CBG today?   Pregnant: No. LMP: No LMP for male patient. (age 64-55)  Anticoagulants: no Anti-inflammatory: no Antibiotics: no  Procedure: Right L3,4,5 Radiofrequency Neurotomy Position: Prone Start Time: 11:46am  End Time:12:00pm   Fluoro Time: 32s  RN/CMA Truman Hayward, CMA Wessling, CMA    Time 10:51am 12:06pm    BP 135/73 128/74    Pulse 86 7    Respirations 16 16    O2 Sat 98 98    S/S 6 6    Pain Level 6/10 5/10     D/C home with transportation, patient A & O X 3, D/C instructions reviewed, and sits independently.

## 2020-04-23 NOTE — Progress Notes (Signed)

## 2020-04-24 ENCOUNTER — Ambulatory Visit (HOSPITAL_COMMUNITY)
Admission: RE | Admit: 2020-04-24 | Discharge: 2020-04-24 | Disposition: A | Discharge status: Home / Self Care | Payer: Medicaid Other | Source: Ambulatory Visit | Attending: Internal Medicine | Admitting: Internal Medicine

## 2020-04-24 ENCOUNTER — Encounter (HOSPITAL_COMMUNITY): Payer: Self-pay

## 2020-04-24 ENCOUNTER — Other Ambulatory Visit: Payer: Self-pay

## 2020-04-24 DIAGNOSIS — J432 Centrilobular emphysema: Secondary | ICD-10-CM | POA: Diagnosis not present

## 2020-04-24 DIAGNOSIS — R0602 Shortness of breath: Secondary | ICD-10-CM | POA: Insufficient documentation

## 2020-04-24 DIAGNOSIS — I700 Atherosclerosis of aorta: Secondary | ICD-10-CM | POA: Diagnosis not present

## 2020-04-24 DIAGNOSIS — R918 Other nonspecific abnormal finding of lung field: Secondary | ICD-10-CM | POA: Diagnosis not present

## 2020-04-24 DIAGNOSIS — I2510 Atherosclerotic heart disease of native coronary artery without angina pectoris: Secondary | ICD-10-CM | POA: Diagnosis not present

## 2020-04-24 MED ORDER — IOHEXOL 300 MG/ML  SOLN
75.0000 mL | Freq: Once | INTRAMUSCULAR | Status: AC | PRN
  Administered 2020-04-24: 75 mL via INTRAVENOUS

## 2020-05-01 ENCOUNTER — Telehealth: Payer: Self-pay

## 2020-05-01 NOTE — Telephone Encounter (Signed)
Contacted pt to go over CT results pt is aware and doesn't have any questions or concerns  

## 2020-05-05 ENCOUNTER — Telehealth: Payer: Self-pay | Admitting: Internal Medicine

## 2020-05-05 DIAGNOSIS — R0602 Shortness of breath: Secondary | ICD-10-CM

## 2020-05-07 NOTE — Telephone Encounter (Signed)
Contacted pt and scheduled a lab visit for 3/29

## 2020-05-12 ENCOUNTER — Ambulatory Visit: Payer: Medicaid Other | Attending: Internal Medicine

## 2020-05-12 ENCOUNTER — Other Ambulatory Visit: Payer: Self-pay

## 2020-05-12 DIAGNOSIS — R0602 Shortness of breath: Secondary | ICD-10-CM | POA: Diagnosis not present

## 2020-05-13 LAB — BRAIN NATRIURETIC PEPTIDE: BNP: 5.5 pg/mL (ref 0.0–100.0)

## 2020-05-18 ENCOUNTER — Encounter: Payer: Self-pay | Admitting: Internal Medicine

## 2020-05-18 ENCOUNTER — Telehealth: Payer: Self-pay | Admitting: Internal Medicine

## 2020-05-18 ENCOUNTER — Other Ambulatory Visit: Payer: Self-pay

## 2020-05-18 ENCOUNTER — Ambulatory Visit: Payer: Medicaid Other | Attending: Internal Medicine | Admitting: Internal Medicine

## 2020-05-18 ENCOUNTER — Other Ambulatory Visit: Payer: Self-pay | Admitting: Internal Medicine

## 2020-05-18 DIAGNOSIS — R0602 Shortness of breath: Secondary | ICD-10-CM

## 2020-05-18 DIAGNOSIS — I2510 Atherosclerotic heart disease of native coronary artery without angina pectoris: Secondary | ICD-10-CM

## 2020-05-18 DIAGNOSIS — J432 Centrilobular emphysema: Secondary | ICD-10-CM

## 2020-05-18 DIAGNOSIS — F17200 Nicotine dependence, unspecified, uncomplicated: Secondary | ICD-10-CM | POA: Diagnosis not present

## 2020-05-18 DIAGNOSIS — R918 Other nonspecific abnormal finding of lung field: Secondary | ICD-10-CM | POA: Diagnosis not present

## 2020-05-18 DIAGNOSIS — R972 Elevated prostate specific antigen [PSA]: Secondary | ICD-10-CM

## 2020-05-18 NOTE — Telephone Encounter (Signed)
Appointment or nurse visit?

## 2020-05-18 NOTE — Telephone Encounter (Signed)
Nurse visit

## 2020-05-18 NOTE — Telephone Encounter (Signed)
   Notes to clinic: Patient has appointment today  Not due until the 10th   Requested Prescriptions  Pending Prescriptions Disp Refills   PROAIR HFA 108 (90 Base) MCG/ACT inhaler [Pharmacy Med Name: PROAIR HFA ORAL INH (200  PFS) 8.5G] 8.5 g 0    Sig: INHALE 2 PUFFS INTO THE LUNGS EVERY 6 HOURS AS NEEDED FOR WHEEZING OR SHORTNESS OF BREATH      Pulmonology:  Beta Agonists Failed - 05/18/2020  3:24 AM      Failed - One inhaler should last at least one month. If the patient is requesting refills earlier, contact the patient to check for uncontrolled symptoms.      Passed - Valid encounter within last 12 months    Recent Outpatient Visits           1 month ago Chronic left-sided low back pain without sciatica   [place], MD   12 months ago Viral respiratory illness   [place] Ladell Pier, MD   1 year ago Annual physical exam   [place] Ladell Pier, MD   1 year ago Chronic left-sided low back pain without sciatica   [place], Deborah B, MD   1 year ago Tobacco dependence   [place], MD       Future Appointments             Today Ladell Pier, MD [place]

## 2020-05-18 NOTE — Progress Notes (Signed)
Virtual Visit via Telephone Note  I connected with Nathaniel Robinson on 05/18/2020 at 10:00 a.m by telephone and verified that I am speaking with the correct person using two identifiers.  Location: Patient: home Provider: office  Participants: Myself Patient Nathaniel Robinson: [phone], Bonnita Nasuti   I discussed the limitations, risks, security and privacy concerns of performing an evaluation and management service by telephone and the availability of in person appointments. I also discussed with the patient that there may be a patient responsible charge related to this service. The patient expressed understanding and agreed to proceed.   History of Present Illness: Pt with hx of HL, chronic LBP, GERD,peripheral neuropathy,former smoker (quit spring 2020).  Office visit 03/31/20: Complains of SOB sometimes when he walks a little longer than usual.  He can walk comfortably for 20 to 30 minutes.  If he tries to walk for 45 minutes, he noticed shortness of breath and wheezing.  No chest pains.  He also notices shortness of breath when he first lays down at night.  States that he props his pillow up under the head and it goes away.  He sleeps on one pillow.  Denies any PND.  No lower extremity edema.  Some cough intermittently which can be productive or nonproductive.  Had quit smoking 2020 but restarted 10 cig/day x 1 yr.  He is wanting to quit again.  My Dxx on that visit was COPD, CHF or asthma.  Given Albuterol inhaler, advise to quit smoking, started on nicotine patches. CXR ordered and this revealed  ill-defined patchy bibasilar opacities questionably pneumonia. Prescribed Z-pack which he completed. Today, he reports he is feeling better.  Uses Albuterol inhaler about every 3 days.  -CT chest revealed centrilobular emphysema and 2 4 mm nodules in the lingula and RML.  Radiologist recommended repeat in 1 yr if pt at high risk -just started using nicotine patches 3 days ago.  He has stopped  smoking  Incidental finding on CT chest was aortic and coronary atherosclerosis.  He is on lipitor and taking consistently    Elev PSA:  PSA was 6.2 which is stable compared to when last checked about 1 yr ago.  Had negative prostate bx 02/2019 by [facility].  Reports he received letter from them end of last yr telling him to call and schedule f/u appt with them in the new yr.  He has not done so as yet.   Reports Medicaid would not cover Westcort Cream.  Wants to know if another cream can be prescribed.   Outpatient Encounter Medications as of 05/18/2020  Medication Sig  . albuterol (PROAIR HFA) 108 (90 Base) MCG/ACT inhaler INHALE 2 PUFFS INTO THE LUNGS EVERY 6 HOURS AS NEEDED FOR WHEEZING OR SHORTNESS OF BREATH  . atorvastatin (LIPITOR) 20 MG tablet TAKE 1 TABLET(20 MG) BY MOUTH DAILY  . azithromycin (ZITHROMAX Z-PAK) 250 MG tablet 2 tablets p.o. x1 then 1 tablet daily.  . hydrocortisone valerate cream (WESTCORT) 0.2 % Apply small amount to rectal area every other day as needed  . nicotine (NICODERM CQ - DOSED IN MG/24 HOURS) 21 mg/24hr patch Place 1 patch (21 mg total) onto the skin daily.  . predniSONE (DELTASONE) 20 MG tablet Take 1 tablet (20 mg total) by mouth daily with breakfast.  . traMADol (ULTRAM) 50 MG tablet TAKE 1 TABLET(50 MG) BY MOUTH EVERY 8 HOURS AS NEEDED FOR MODERATE PAIN   No facility-administered encounter medications on file as of 05/18/2020.      Observations/Objective:  No direct observation done as this was a telephone encounter. Results for orders placed or performed in visit on 05/12/20  Brain natriuretic peptide  Result Value Ref Range   BNP 5.5 0.0 - 100.0 pg/mL     Chemistry      Component Value Date/Time   NA 141 04/01/2020 1032   K 4.2 04/01/2020 1032   CL 102 04/01/2020 1032   CO2 26 04/01/2020 1032   BUN 9 04/01/2020 1032   CREATININE 0.69 (L) 04/01/2020 1032   CREATININE 0.68 (L) 09/08/2015 1041      Component Value Date/Time   CALCIUM 9.5  04/01/2020 1032   ALKPHOS 67 04/01/2020 1032   AST 24 04/01/2020 1032   ALT 26 04/01/2020 1032   BILITOT 0.4 04/01/2020 1032     Lab Results  Component Value Date   CHOL 142 04/01/2020   HDL 56 04/01/2020   LDLCALC 62 04/01/2020   TRIG 137 04/01/2020   CHOLHDL 2.5 04/01/2020   Lab Results  Component Value Date   WBC 7.6 04/01/2020   HGB 15.9 04/01/2020   HCT 48.5 04/01/2020   MCV 99 (H) 04/01/2020   PLT 217 04/01/2020   Lab Results  Component Value Date   PSA1 6.2 (H) 04/01/2020   PSA1 6.4 (H) 12/24/2018    Assessment and Plan: 1. Centrilobular emphysema (HCC) Continue albuterol inhaler as needed.  He feels he is doing okay with just the albuterol and does not need another inhaler at this time.  Advised to remain tobacco free.  Discussed the association of emphysema to smoking and risks of continued progression if he resumes smoking   2. Lung nodules Given his history of cigarette smoking, I advised that we repeat the CAT scan in 1 year.  Patient is agreeable to this.  Advised that if there is significant growth then we would need to refer him for biopsy.  3. Atherosclerosis of native coronary artery of native heart without angina pectoris Discussed the significance of this.  He will continue statin therapy.  Will refer him to cardiology. - Ambulatory referral to Cardiology  4. Tobacco dependence Commended him on quitting.  Encouraged him to remain tobacco free.  He is using the nicotine patches to help decrease his cravings.  5. Elevated PSA Stable compared to a year ago.  He is due for follow-up with [facility].  I will go ahead and submit the referral for him. - Ambulatory referral to Urology  Since Medicaid does not pay for the Westcort cream, I recommend using hydrocortisone cream over-the-counter.  Follow Up Instructions: 4 mths   I discussed the assessment and treatment plan with the patient. The patient was provided an opportunity to ask questions and  all were answered. The patient agreed with the plan and demonstrated an understanding of the instructions.   The patient was advised to call back or seek an in-person evaluation if the symptoms worsen or if the condition fails to improve as anticipated.  I  Spent 22  minutes on this telephone encounter  Karle Plumber, MD

## 2020-05-22 ENCOUNTER — Encounter: Payer: Self-pay | Admitting: Physical Medicine & Rehabilitation

## 2020-05-22 ENCOUNTER — Encounter: Payer: Medicaid Other | Attending: Physical Medicine & Rehabilitation | Admitting: Physical Medicine & Rehabilitation

## 2020-05-22 ENCOUNTER — Other Ambulatory Visit: Payer: Self-pay

## 2020-05-22 VITALS — BP 131/77 | HR 87 | Temp 98.7°F | Ht 65.0 in | Wt 133.0 lb

## 2020-05-22 DIAGNOSIS — M47816 Spondylosis without myelopathy or radiculopathy, lumbar region: Secondary | ICD-10-CM | POA: Diagnosis not present

## 2020-05-22 NOTE — Progress Notes (Signed)
  PROCEDURE RECORD [facility]   Name: Caylen Yardley DOB:04-Sep-1955 MRN: [id number]  Date:05/22/2020  Physician: Alysia Penna, MD    Nurse/CMA: Chelse Matas, CMA    Allergies: No Known Allergies  Consent Signed: Yes.    Is patient diabetic? No.  CBG today?   Pregnant: No. LMP: No LMP for male patient. (age 64-55)  Anticoagulants: no Anti-inflammatory: no Antibiotics: no  Procedure: left lumbar 3,4,5 radiofrequency ablation  Position: Prone Start Time: 11:03am  End Time: 11:13am   Fluoro Time: 34s   RN/CMA Lashan Gluth, CMA Donette Mainwaring, CMA    Time 10:40am 11:18am    BP 131/77 127/74    Pulse 87 75    Respirations 16 16    O2 Sat 97 95    S/S 6 6    Pain Level 6/10 6/10     D/C home with public transportation, patient A & O X 3, D/C instructions reviewed, and sits independently.

## 2020-05-22 NOTE — Progress Notes (Signed)
Left L5 dorsal ramus., left L4 and left L3 medial branch radio frequency neurotomy under fluoroscopic guidance  Indication: Low back pain due to lumbar spondylosis which has been relieved on 2 occasions by greater than 50% by lumbar medial branch blocks at corresponding levels.  Informed consent was obtained after describing risks and benefits of the procedure with the patient, this includes bleeding, bruising, infection, paralysis and medication side effects. The patient wishes to proceed and has given written consent. The patient was placed in a prone position. The lumbar and sacral area was marked and prepped with Betadine. A 25-gauge 1-1/2 inch needle was inserted into the skin and subcutaneous tissue at 3 sites in one ML of 2% lidocaine was injected into each site. Then a 18-gauge 10 cm radio frequency needle with a 1 cm curved active tip was inserted targeting the left S1 SAP/sacral ala junction. Bone contact was made and confirmed with lateral imaging.  motor stimulation at 2 Hz confirm proper needle location followed by injection of one ML of 2% MPF lidocaine. Then the left L5 SAP/transverse process junction was targeted. Bone contact was made and confirmed with lateral imaging motor stimulation at 2 Hz confirm proper needle location followed by injection of one ML of the solution containing one ML of  2% MPF lidocaine. Then the left L4 SAP/transverse process junction was targeted. Bone contact was made and confirmed with lateral imaging. motor stimulation at 2 Hz confirm proper needle location followed by injection of one ML of the solution containing one ML of2% MPF lidocaine. Radio frequency lesion  at 80C for 90 seconds was performed. Needles were removed. Post procedure instructions and vital signs were performed. Patient tolerated procedure well. Followup appointment was given.  

## 2020-05-22 NOTE — Patient Instructions (Signed)
You had a radio frequency procedure today This was done to alleviate joint pain in your lumbar area We injected lidocaine which is a local anesthetic.  You may experience soreness at the injection sites. You may also experienced some irritation of the nerves that were heated I'm recommending ice for 30 minutes every 2 hours as needed for the next 24-48 hours   

## 2020-06-04 DIAGNOSIS — R972 Elevated prostate specific antigen [PSA]: Secondary | ICD-10-CM | POA: Diagnosis not present

## 2020-06-04 DIAGNOSIS — N5201 Erectile dysfunction due to arterial insufficiency: Secondary | ICD-10-CM | POA: Diagnosis not present

## 2020-06-09 ENCOUNTER — Ambulatory Visit: Payer: Medicaid Other

## 2020-07-07 ENCOUNTER — Other Ambulatory Visit: Payer: Self-pay

## 2020-07-07 ENCOUNTER — Encounter (HOSPITAL_COMMUNITY): Payer: Self-pay | Admitting: *Deleted

## 2020-07-07 ENCOUNTER — Ambulatory Visit (HOSPITAL_COMMUNITY)
Admission: EM | Admit: 2020-07-07 | Discharge: 2020-07-07 | Disposition: A | Discharge status: Home / Self Care | Payer: Medicaid Other | Attending: Family Medicine | Admitting: Family Medicine

## 2020-07-07 ENCOUNTER — Ambulatory Visit (INDEPENDENT_AMBULATORY_CARE_PROVIDER_SITE_OTHER): Payer: Medicaid Other

## 2020-07-07 DIAGNOSIS — A084 Viral intestinal infection, unspecified: Secondary | ICD-10-CM | POA: Diagnosis not present

## 2020-07-07 DIAGNOSIS — J9811 Atelectasis: Secondary | ICD-10-CM | POA: Diagnosis not present

## 2020-07-07 DIAGNOSIS — J209 Acute bronchitis, unspecified: Secondary | ICD-10-CM | POA: Diagnosis not present

## 2020-07-07 DIAGNOSIS — R062 Wheezing: Secondary | ICD-10-CM | POA: Diagnosis not present

## 2020-07-07 DIAGNOSIS — R059 Cough, unspecified: Secondary | ICD-10-CM | POA: Diagnosis not present

## 2020-07-07 MED ORDER — PREDNISONE 20 MG PO TABS: 40.0000 mg | ORAL_TABLET | Freq: Every day | ORAL | 0 refills | Status: DC

## 2020-07-07 MED ORDER — DIPHENOXYLATE-ATROPINE 2.5-0.025 MG PO TABS
1.0000 | ORAL_TABLET | Freq: Three times a day (TID) | ORAL | 0 refills | Status: DC | PRN
Start: 2020-07-07 — End: 2021-01-18

## 2020-07-07 MED ORDER — DOXYCYCLINE HYCLATE 100 MG PO CAPS: 100.0000 mg | ORAL_CAPSULE | Freq: Two times a day (BID) | ORAL | 0 refills | Status: AC

## 2020-07-07 MED ORDER — PROMETHAZINE-DM 6.25-15 MG/5ML PO SYRP: 5.0000 mL | ORAL_SOLUTION | Freq: Three times a day (TID) | ORAL | 0 refills | Status: DC | PRN

## 2020-07-07 NOTE — ED Triage Notes (Signed)
Pt reports the sx's started 3 days ago fatigue and vomiting. Diarrhea started today.

## 2020-07-07 NOTE — ED Provider Notes (Signed)
[facility]    CSN: [id number] Arrival date & time: 07/07/20  1016      History   Chief Complaint Chief Complaint  Patient presents with  . Emesis  . Fatigue  . Night Sweats    HPI Nathaniel Robinson is a 65 y.o. male.   HPI  Patient reports that he vomited x 1 and today developed diarrhea. Coughing has been present for several weeks.  He endorses night sweats and chills although did not measure his temperature.  He uses an albuterol inhaler for chronic shortness of breath and and endorses recently increased wheezing.  He denies any current shortness of breath.  He also endorses some abdominal pain only when he coughs.  Cough is nonproductive Past Medical History:  Diagnosis Date  . Chronic low back pain 2010   . GERD (gastroesophageal reflux disease)     Patient Active Problem List   Diagnosis Date Noted  . Lung nodules 05/18/2020  . Atherosclerosis of native coronary artery of native heart without angina pectoris 05/18/2020  . Elevated PSA 12/26/2018  . Flu vaccine need 10/08/2018  . Spondylosis without myelopathy or radiculopathy, lumbar region 09/14/2018  . Tobacco dependence 06/01/2018  . Paresthesia 01/02/2018  . Gastroesophageal reflux disease without esophagitis 09/15/2017  . Idiopathic peripheral neuropathy 09/15/2017  . Chronic lumbar radiculopathy 02/10/2017  . Hyperlipidemia 03/14/2016  . Lumbar degenerative disc disease 09/11/2015  . Chronic pain syndrome 09/11/2015  . 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 Chronic low back pain     Past Surgical History:  Procedure Laterality Date  . COLONOSCOPY N/A 05/15/2015   Procedure: COLONOSCOPY;  Surgeon: Danie Binder, MD;  Location: [facility];  Service: Endoscopy;  Laterality: N/A;  145 - moved to 1:30 - office to notify       Home Medications    Prior to Admission medications   Medication Sig Start Date End Date Taking? Authorizing Provider  albuterol (PROAIR  HFA) 108 (90 Base) MCG/ACT inhaler INHALE 2 PUFFS INTO THE LUNGS EVERY 6 HOURS AS NEEDED FOR WHEEZING OR SHORTNESS OF BREATH 05/18/20   Ladell Pier, MD  atorvastatin (LIPITOR) 20 MG tablet TAKE 1 TABLET(20 MG) BY MOUTH DAILY 08/09/19   Ladell Pier, MD  azithromycin (ZITHROMAX Z-PAK) 250 MG tablet 2 tablets p.o. x1 then 1 tablet daily. 04/04/20   Ladell Pier, MD  hydrocortisone valerate cream (WESTCORT) 0.2 % Apply small amount to rectal area every other day as needed 03/31/20   Ladell Pier, MD  nicotine (NICODERM CQ - DOSED IN MG/24 HOURS) 21 mg/24hr patch Place 1 patch (21 mg total) onto the skin daily. 03/31/20   Ladell Pier, MD  predniSONE (DELTASONE) 20 MG tablet Take 1 tablet (20 mg total) by mouth daily with breakfast. 04/04/20   Ladell Pier, MD  traMADol (ULTRAM) 50 MG tablet TAKE 1 TABLET(50 MG) BY MOUTH EVERY 8 HOURS AS NEEDED FOR MODERATE PAIN 03/31/20   Ladell Pier, MD    Family History Family History  Problem Relation Age of Onset  . Ovarian cancer Mother   . Other Father        unsure of medical history  . Heart disease Neg Hx   . Hypertension Neg Hx   . Diabetes Neg Hx   . Colon cancer Neg Hx     Social History Social History   Tobacco Use  . Smoking status: Former Smoker    Types: Cigarettes  Quit date: 05/15/2020    Years since quitting: 0.1  . Smokeless tobacco: Never Used  Substance Use Topics  . Alcohol use: Yes    Alcohol/week: 0.0 standard drinks    Comment: social   . Drug use: No     Allergies   Patient has no known allergies.   Review of Systems Review of Systems Pertinent negatives listed in HPI   Physical Exam Triage Vital Signs ED Triage Vitals  Enc Vitals Group     BP 07/07/20 1128 108/68     Pulse Rate 07/07/20 1128 93     Resp 07/07/20 1128 18     Temp 07/07/20 1128 97.9 F (36.6 C)     Temp src --      SpO2 07/07/20 1128 95 %     Weight --      Height --      Head Circumference --       Peak Flow --      Pain Score 07/07/20 1135 5     Pain Loc --      Pain Edu? --      Excl. in [place]? --    No data found.  Updated Vital Signs BP 108/68   Pulse 93   Temp 97.9 F (36.6 C)   Resp 18   SpO2 95%   Visual Acuity Right Eye Distance:   Left Eye Distance:   Bilateral Distance:    Right Eye Near:   Left Eye Near:    Bilateral Near:     Physical Exam Constitutional:      Appearance: He is ill-appearing.  Cardiovascular:     Rate and Rhythm: Normal rate and regular rhythm.  Pulmonary:     Breath sounds: Examination of the right-upper field reveals wheezing. Examination of the left-upper field reveals wheezing. Examination of the right-middle field reveals wheezing. Examination of the left-middle field reveals wheezing. Examination of the right-lower field reveals wheezing. Decreased breath sounds and wheezing present.  Abdominal:     General: Bowel sounds are increased. There are signs of injury.     Tenderness: There is abdominal tenderness in the epigastric area.  Neurological:     Mental Status: He is alert.  Psychiatric:        Attention and Perception: Attention normal.        Mood and Affect: Mood normal.        Speech: Speech normal.        Behavior: Behavior normal.        Thought Content: Thought content normal.    UC Treatments / Results  Labs (all labs ordered are listed, but only abnormal results are displayed) Labs Reviewed - No data to display  EKG   Radiology DG Chest 2 View  Result Date: 07/07/2020 CLINICAL DATA:  Wheezing.  Coughing. EXAM: CHEST - 2 VIEW COMPARISON:  CT chest 04/24/2020.  Chest x-ray 04/01/2020. FINDINGS: Mediastinum hilar structures normal. Mild peribronchial cuffing. Bronchitis cannot be excluded. Mild bibasilar atelectasis. No pleural effusion or pneumothorax. IMPRESSION: Mild peribronchial cuffing. Bronchitis cannot be excluded. Mild bibasilar atelectasis. Electronically Signed   By: Marcello Moores  Register   On: 07/07/2020  12:54    Procedures Procedures (including critical care time)  Medications Ordered in UC Medications - No data to display  Initial Impression / Assessment and Plan / UC Course  I have reviewed the triage vital signs and the nursing notes.  Pertinent labs & imaging results that were available during my care of the  patient were reviewed by me and considered in my medical decision making (see chart for details).     Chest x-ray negative for pneumonia shows prebronchial cuffing consistent with that of bronchitis.  Treating with doxycycline, prednisone, and patient advised to resume use of his albuterol inhaler.  Promethazine DM prescribed for cough.  For 1 episode of diarrhea did instruct patient not to immediately start Lomotil if diarrhea becomes frequent over today and persist into tomorrow along then treat the diarrhea.  Advised that overtreating diarrhea could result in constipation. Advised to follow-up with PCP. Final Clinical Impressions(s) / UC Diagnoses   Final diagnoses:  Acute bronchitis, unspecified organism  Viral gastroenteritis     Discharge Instructions     Resume use of your albuterol inhaler.  Your chest x-ray shows bronchitis.  I am treating you with prednisone 40 mg once daily for 5 days.  Promethazine DM for cough.  Doxycycline 100 mg twice daily for 7 days.  Have also sent over Lomotil for diarrhea only take tomorrow if diarrhea has not resolved.  Hydrate well with fluids held taking this medication.  If you develop any severe abdominal pain or experienced any difficulty breathing go immediately to the emergency department.    ED Prescriptions    Medication Sig Dispense Auth. Provider   diphenoxylate-atropine (LOMOTIL) 2.5-0.025 MG tablet Take 1 tablet by mouth 3 (three) times daily as needed for diarrhea or loose stools. 10 tablet Scot Jun, FNP   promethazine-dextromethorphan (PROMETHAZINE-DM) 6.25-15 MG/5ML syrup Take 5 mLs by mouth 3 (three) times  daily as needed for cough. 140 mL Scot Jun, FNP   predniSONE (DELTASONE) 20 MG tablet Take 2 tablets (40 mg total) by mouth daily with breakfast. 10 tablet Scot Jun, FNP   doxycycline (VIBRAMYCIN) 100 MG capsule Take 1 capsule (100 mg total) by mouth 2 (two) times daily for 7 days. 14 capsule Scot Jun, FNP     PDMP not reviewed this encounter.   Scot Jun, FNP 07/07/20 [id number]

## 2020-07-07 NOTE — Discharge Instructions (Signed)
Resume use of your albuterol inhaler.  Your chest x-ray shows bronchitis.  I am treating you with prednisone 40 mg once daily for 5 days.  Promethazine DM for cough.  Doxycycline 100 mg twice daily for 7 days.  Have also sent over Lomotil for diarrhea only take tomorrow if diarrhea has not resolved.  Hydrate well with fluids held taking this medication.  If you develop any severe abdominal pain or experienced any difficulty breathing go immediately to the emergency department.

## 2020-07-09 ENCOUNTER — Other Ambulatory Visit: Payer: Self-pay

## 2020-07-09 ENCOUNTER — Encounter (HOSPITAL_COMMUNITY): Payer: Self-pay | Admitting: Emergency Medicine

## 2020-07-09 ENCOUNTER — Ambulatory Visit (HOSPITAL_COMMUNITY)
Admission: EM | Admit: 2020-07-09 | Discharge: 2020-07-09 | Disposition: A | Discharge status: Home / Self Care | Payer: Medicaid Other | Attending: Internal Medicine | Admitting: Internal Medicine

## 2020-07-09 DIAGNOSIS — Z20822 Contact with and (suspected) exposure to covid-19: Secondary | ICD-10-CM

## 2020-07-09 LAB — SARS CORONAVIRUS 2 (TAT 6-24 HRS): SARS Coronavirus 2: POSITIVE — AB

## 2020-07-09 NOTE — Discharge Instructions (Signed)

## 2020-07-09 NOTE — ED Triage Notes (Signed)
Requests a covid test.  Patient is coughing .   Patient has had close contact with a  Child that has tested positive for covid.  Cough is slightly better.

## 2020-07-10 ENCOUNTER — Telehealth: Payer: Self-pay | Admitting: Physician Assistant

## 2020-07-10 NOTE — Telephone Encounter (Signed)
Called to discuss with patient about COVID-19 symptoms and the use of one of the available treatments for those with mild to moderate Covid symptoms and at a high risk of hospitalization.  Pt appears to qualify for outpatient treatment due to co-morbid conditions and/or a member of an at-risk group in accordance with the FDA Emergency Use Authorization.    Symptom onset: 07/05/20  Patient is out of window to receive oral antiviral drug.   Pt is qualified for the monoclonal antibody infusion, but cann't infuse within 7 days of symptoms onset.   Preventative practices reviewed. Patient verbalized understanding.  [place], [place]  07/10/2020 11:40 AM

## 2020-07-13 ENCOUNTER — Telehealth: Payer: Self-pay | Admitting: Internal Medicine

## 2020-07-13 MED ORDER — UMECLIDINIUM BROMIDE 62.5 MCG/INH IN AEPB: 1.0000 | INHALATION_SPRAY | Freq: Every day | RESPIRATORY_TRACT | 6 refills | Status: DC

## 2020-07-15 ENCOUNTER — Ambulatory Visit: Payer: Medicaid Other | Admitting: Cardiology

## 2020-07-17 ENCOUNTER — Telehealth: Payer: Self-pay

## 2020-07-17 NOTE — Telephone Encounter (Signed)
Incruse is non-preferred on patient's ins.  If appropriate can you resend for Anoro, Combivent, or Spiriva to [facility]?

## 2020-07-18 MED ORDER — TIOTROPIUM BROMIDE MONOHYDRATE 18 MCG IN CAPS: 18.0000 ug | ORAL_CAPSULE | Freq: Every day | RESPIRATORY_TRACT | 12 refills | Status: DC

## 2020-07-22 NOTE — Telephone Encounter (Signed)
Per Boykin Reaper, RMA patient is aware of message sent by Dr. Wynetta Emery regarding additional inhaler.

## 2020-07-23 ENCOUNTER — Ambulatory Visit (HOSPITAL_COMMUNITY)
Admission: EM | Admit: 2020-07-23 | Discharge: 2020-07-23 | Disposition: A | Discharge status: Home / Self Care | Payer: Medicaid Other

## 2020-07-23 ENCOUNTER — Other Ambulatory Visit: Payer: Self-pay

## 2020-07-28 ENCOUNTER — Other Ambulatory Visit: Payer: Self-pay | Admitting: Internal Medicine

## 2020-07-28 DIAGNOSIS — R0602 Shortness of breath: Secondary | ICD-10-CM

## 2020-07-28 NOTE — Telephone Encounter (Signed)
Requested medications are due for refill today yes  Requested medications are on the active medication list yes  Last refill 07/05/20  Last visit 05/18/20  Future visit scheduled 09/17/20  Notes to clinic Pt asking for inhaler early, please assess.

## 2020-08-07 ENCOUNTER — Other Ambulatory Visit: Payer: Self-pay | Admitting: Internal Medicine

## 2020-08-07 DIAGNOSIS — F17200 Nicotine dependence, unspecified, uncomplicated: Secondary | ICD-10-CM

## 2020-08-14 ENCOUNTER — Encounter: Payer: Self-pay | Admitting: Physical Medicine & Rehabilitation

## 2020-08-14 ENCOUNTER — Encounter: Payer: Medicaid Other | Attending: Physical Medicine & Rehabilitation | Admitting: Physical Medicine & Rehabilitation

## 2020-08-14 ENCOUNTER — Other Ambulatory Visit: Payer: Self-pay

## 2020-08-14 VITALS — BP 117/78 | HR 78 | Ht 65.0 in | Wt 146.2 lb

## 2020-08-14 DIAGNOSIS — M47816 Spondylosis without myelopathy or radiculopathy, lumbar region: Secondary | ICD-10-CM

## 2020-08-14 NOTE — Progress Notes (Signed)
Subjective:    Patient ID: Nathaniel Robinson, male    DOB: Jul 28, 1955, 65 y.o.   MRN: [id number] Author: Charlett Blake, MD Author Type: Physician Filed: 04/23/2020  1:10 PM  Note Status: Signed Cosign: Cosign Not Required Encounter Date: 04/23/2020  Editor: Charlett Blake, MD (Physician)                RightL5 dorsal ramus., Right L4 and Right L3 medial branch radio frequency neurotomy under fluoroscopic guidance      Author: Charlett Blake, MD Author Type: Physician Filed: 05/22/2020 11:24 AM  Note Status: Signed Cosign: Cosign Not Required Encounter Date: 05/22/2020  Editor: Charlett Blake, MD (Physician)              Left L5 dorsal ramus., left L4 and left L3 medial branch radio frequency neurotomy under fluoroscopic guidance    04/23/2020  HPI iPad Arabic interpreter utilized.  Patient speaks some English as well 65 year old Arabic speaking male with history of lumbar spondylosis who is here for his chronic low back pain.  Overall feels like he has done well after the left L3-4-5 RF on 3 11/2020 and the right L3-4-5 RF on 05/22/2020. He has returned to his usual activities.  He does not work outside the home he does do cooking and cleaning.  Walking tolerance is ~1/2 hour No pain into legs  No exercise or stretching since this aggravates pain.  Not working Does cleaning and cooking in the house  Pain Inventory Average Pain 5 Pain Right Now 3 My pain is burning  In the last 24 hours, has pain interfered with the following? General activity 5 Relation with others 5 Enjoyment of life 5 What TIME of day is your pain at its worst? daytime Sleep (in general) Fair  Pain is worse with: walking, bending, standing, and some activites Pain improves with: rest and injections Relief from Meds: 6  Family History  Problem Relation Age of Onset   Ovarian cancer Mother    Other Father        unsure of medical history   Heart disease Neg Hx    Hypertension Neg Hx     Diabetes Neg Hx    Colon cancer Neg Hx    Social History   Socioeconomic History   Marital status: Married    Spouse name: Not on file   Number of children: 4   Years of education: come college   Highest education level: Not on file  Occupational History   Occupation: disability    Comment: [place]   Tobacco Use   Smoking status: Former    Pack years: 0.00    Types: Cigarettes    Quit date: 05/15/2020    Years since quitting: 0.2   Smokeless tobacco: Never  Vaping Use   Vaping Use: Never used  Substance and Sexual Activity   Alcohol use: Yes    Alcohol/week: 0.0 standard drinks    Comment: social    Drug use: No   Sexual activity: Never  Other Topics Concern   Not on file  Social History Narrative   Form [place]   Moved to [place] in 01/2014    Lives with roommate.   Right-handed.   No caffeine use.   Wife and 4 children in [place].    Social Determinants of Health   Financial Resource Strain: Not on file  Food Insecurity: Not on file  Transportation Needs: Not on file  Physical Activity: Not on file  Stress: Not on file  Social Connections: Not on file   Past Surgical History:  Procedure Laterality Date   COLONOSCOPY N/A 05/15/2015   Procedure: COLONOSCOPY;  Surgeon: Danie Binder, MD;  [facility];  Service: Endoscopy;  Laterality: N/A;  145 - moved to 1:30 - office to notify   Past Surgical History:  Procedure Laterality Date   COLONOSCOPY N/A 05/15/2015   Procedure: COLONOSCOPY;  Surgeon: Danie Binder, MD;  [facility];  Service: Endoscopy;  Laterality: N/A;  145 - moved to 1:30 - office to notify   Past Medical History:  Diagnosis Date   Chronic low back pain 2010    GERD (gastroesophageal reflux disease)    BP 117/78   Pulse 78   Ht 5\' 5"  (1.651 m)   Wt 146 lb 3.2 oz (66.3 kg)   SpO2 97%   BMI 24.33 kg/m   Opioid Risk Score:   Fall Risk Score:  `1  Depression screen PHQ 2/9  Depression screen [facility] 2/9 08/14/2020  03/31/2020 05/24/2019 02/27/2018 01/30/2018 10/31/2017 09/15/2017  Decreased Interest 0 0 0 0 0 0 0  Down, Depressed, Hopeless 0 0 0 0 0 0 0  PHQ - 2 Score 0 0 0 0 0 0 0  Altered sleeping - - - - - - -  Tired, decreased energy - - - - - - -  Change in appetite - - - - - - -  Feeling bad or failure about yourself  - - - - - - -  Trouble concentrating - - - - - - -  Moving slowly or fidgety/restless - - - - - - -  Suicidal thoughts - - - - - - -  PHQ-9 Score - - - - - - -  Some recent data might be hidden     Review of Systems  Constitutional: Negative.   HENT: Negative.    Eyes: Negative.   Respiratory: Negative.    Cardiovascular: Negative.   Gastrointestinal: Negative.   Endocrine: Negative.   Genitourinary: Negative.   Musculoskeletal:  Positive for back pain.  Skin: Negative.   Allergic/Immunologic: Negative.   Neurological:        Burning  Hematological: Negative.   Psychiatric/Behavioral: Negative.    All other systems reviewed and are negative.     Objective:   Physical Exam Constitutional:      Appearance: He is normal weight.  HENT:     Head: Normocephalic and atraumatic.  Eyes:     Extraocular Movements: Extraocular movements intact.     Conjunctiva/sclera: Conjunctivae normal.     Pupils: Pupils are equal, round, and reactive to light.  Musculoskeletal:     Comments: Lumbar range of motion 100% flexion extension.  Extension is more painful than with flexion. Negative straight leg raising  Neurological:     Mental Status: He is alert and oriented to person, place, and time.     Comments: Gait is normal Lower extremity strength is 5/5 hip flexor knee extensor ankle dorsiflexor  Psychiatric:        Mood and Affect: Mood normal.        Behavior: Behavior normal.          Assessment & Plan:  1.  Lumbar spondylosis without myelopathy has done very well with L3-L4 medial branch L5 dorsal ramus radiofrequency neurotomies performed 3 to 4 months ago We  discussed that usual duration of response is 6 to 12 months.  We discussed  that it is difficult to predict actual duration.  The patient is to call when back pain is starting to recur in a moderate to severe range.  Would repeat radiofrequency.  Would not repeat sooner than 6 months from initial.

## 2020-08-14 NOTE — Patient Instructions (Signed)
Call when pain returns in the low back

## 2020-09-16 ENCOUNTER — Ambulatory Visit: Payer: Medicaid Other | Admitting: Cardiology

## 2020-09-17 ENCOUNTER — Encounter: Payer: Self-pay | Admitting: Internal Medicine

## 2020-09-17 ENCOUNTER — Other Ambulatory Visit: Payer: Self-pay

## 2020-09-17 ENCOUNTER — Ambulatory Visit: Payer: Medicaid Other | Attending: Internal Medicine | Admitting: Internal Medicine

## 2020-09-17 VITALS — BP 127/81 | HR 93 | Resp 16 | Wt 149.0 lb

## 2020-09-17 DIAGNOSIS — M4726 Other spondylosis with radiculopathy, lumbar region: Secondary | ICD-10-CM | POA: Diagnosis not present

## 2020-09-17 DIAGNOSIS — I2510 Atherosclerotic heart disease of native coronary artery without angina pectoris: Secondary | ICD-10-CM | POA: Diagnosis not present

## 2020-09-17 DIAGNOSIS — F17210 Nicotine dependence, cigarettes, uncomplicated: Secondary | ICD-10-CM | POA: Insufficient documentation

## 2020-09-17 DIAGNOSIS — M545 Low back pain, unspecified: Secondary | ICD-10-CM

## 2020-09-17 DIAGNOSIS — Z79899 Other long term (current) drug therapy: Secondary | ICD-10-CM

## 2020-09-17 DIAGNOSIS — G8929 Other chronic pain: Secondary | ICD-10-CM | POA: Diagnosis not present

## 2020-09-17 DIAGNOSIS — J432 Centrilobular emphysema: Secondary | ICD-10-CM

## 2020-09-17 DIAGNOSIS — F17200 Nicotine dependence, unspecified, uncomplicated: Secondary | ICD-10-CM

## 2020-09-17 DIAGNOSIS — Z8249 Family history of ischemic heart disease and other diseases of the circulatory system: Secondary | ICD-10-CM | POA: Insufficient documentation

## 2020-09-17 DIAGNOSIS — E785 Hyperlipidemia, unspecified: Secondary | ICD-10-CM

## 2020-09-17 MED ORDER — NICOTINE 7 MG/24HR TD PT24: 7.0000 mg | MEDICATED_PATCH | Freq: Every day | TRANSDERMAL | 0 refills | Status: DC

## 2020-09-17 MED ORDER — TRAMADOL HCL 50 MG PO TABS: 50.0000 mg | ORAL_TABLET | Freq: Every day | ORAL | 0 refills | Status: DC | PRN

## 2020-09-17 NOTE — Progress Notes (Signed)
Patient ID: Lorrie Vance, male    DOB: Jan 16, 1956  MRN: [id number]  CC: Back Pain   Subjective: Nathaniel Robinson is a 65 y.o. male who presents for chronic ds management His concerns today include:  Pt with hx of HL, chronic LBP, GERD, peripheral neuropathy, former smoker, centrilobular emphysema, lung nodules (due for repeat 03/2021), aortic and coronary Ca+, elev PSA (neg bx 02/2019)  Emphysema: had COVID 2 mths ago.  Breathing back to baseline. He states he stopped using Albuterol does not need it. Using Spiriva inhaler which is very helpful.  Cough minimal since starting Spiriva.   Has had 2 COVID-19 vaccines -had quit smoking but started again 10 days ago due to in stress.  smoking 3 cigarettes/day    Taking and tolerating Lipitor.  LDL cholesterol in February of this year was 58. Referred to cardiology on last visit 05/2020 due to aortic/coronary Ca+.  He has appt 10/12/2020.  No chest pains at this time.  Elev PSA:  reports seeing his urology 3 mths ago.   new med ordered but too expensive.  He plans to reconnect with the urologist to see whether a similar medication that cause less can be prescribed.  He does not recall the name of the medicine or what it was prescribed for.  Chronic Back Pain:  Saw Dr. Letta Pate 1 mth ago.  No inj given at that time. Request RF on Tramadol Pain with walking long distances.  Takes Tramadol once most days but not every day.  He takes it if he will be active and doing a lot of walking.  Out for a while.  He finds the medication very helpful.  No constipation or drowsiness with the medication.     Patient Active Problem List   Diagnosis Date Noted   Lung nodules 05/18/2020   Atherosclerosis of native coronary artery of native heart without angina pectoris 05/18/2020   Elevated PSA 12/26/2018   Flu vaccine need 10/08/2018   Spondylosis without myelopathy or radiculopathy, lumbar region 09/14/2018   Tobacco dependence 06/01/2018   Paresthesia  01/02/2018   Gastroesophageal reflux disease without esophagitis 09/15/2017   Idiopathic peripheral neuropathy 09/15/2017   Chronic lumbar radiculopathy 02/10/2017   Hyperlipidemia 03/14/2016   Lumbar degenerative disc disease 09/11/2015   Chronic pain syndrome 09/11/2015   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   Chronic low back pain      Current Outpatient Medications on File Prior to Visit  Medication Sig Dispense Refill   albuterol (PROAIR HFA) 108 (90 Base) MCG/ACT inhaler INHALE 2 PUFFS INTO THE LUNGS EVERY 6 HOURS AS NEEDED FOR WHEEZING OR SHORTNESS OF BREATH 8.5 g 2   atorvastatin (LIPITOR) 20 MG tablet TAKE 1 TABLET(20 MG) BY MOUTH DAILY 90 tablet prn   diphenoxylate-atropine (LOMOTIL) 2.5-0.025 MG tablet Take 1 tablet by mouth 3 (three) times daily as needed for diarrhea or loose stools. 10 tablet 0   hydrocortisone valerate cream (WESTCORT) 0.2 % Apply small amount to rectal area every other day as needed 45 g 0   nicotine (NICODERM CQ - DOSED IN MG/24 HOURS) 21 mg/24hr patch APPLY 1 PATCH(21 MG) TOPICALLY TO THE SKIN DAILY 28 patch 1   promethazine-dextromethorphan (PROMETHAZINE-DM) 6.25-15 MG/5ML syrup Take 5 mLs by mouth 3 (three) times daily as needed for cough. 140 mL 0   tiotropium (SPIRIVA) 18 MCG inhalation capsule Place 1 capsule (18 mcg total) into inhaler and inhale daily. 30 capsule 12   traMADol (ULTRAM)  50 MG tablet TAKE 1 TABLET(50 MG) BY MOUTH EVERY 8 HOURS AS NEEDED FOR MODERATE PAIN 60 tablet 0   No current facility-administered medications on file prior to visit.    No Known Allergies  Social History   Socioeconomic History   Marital status: Married    Spouse name: Not on file   Number of children: 4   Years of education: come college   Highest education level: Not on file  Occupational History   Occupation: disability    Comment: [place]   Tobacco Use   Smoking status: Former    Types: Cigarettes    Quit date:  05/15/2020    Years since quitting: 0.3   Smokeless tobacco: Never  Vaping Use   Vaping Use: Never used  Substance and Sexual Activity   Alcohol use: Yes    Alcohol/week: 0.0 standard drinks    Comment: social    Drug use: No   Sexual activity: Never  Other Topics Concern   Not on file  Social History Narrative   Form [place]   Moved to [place] in 01/2014    Lives with roommate.   Right-handed.   No caffeine use.   Wife and 4 children in [place].    Social Determinants of Health   Financial Resource Strain: Not on file  Food Insecurity: Not on file  Transportation Needs: Not on file  Physical Activity: Not on file  Stress: Not on file  Social Connections: Not on file  Intimate Partner Violence: Not on file    Family History  Problem Relation Age of Onset   Ovarian cancer Mother    Other Father        unsure of medical history   Heart disease Neg Hx    Hypertension Neg Hx    Diabetes Neg Hx    Colon cancer Neg Hx     Past Surgical History:  Procedure Laterality Date   COLONOSCOPY N/A 05/15/2015   Procedure: COLONOSCOPY;  Surgeon: Danie Binder, MD;  [facility];  Service: Endoscopy;  Laterality: N/A;  145 - moved to 1:30 - office to notify    ROS: Review of Systems Negative except as stated above  PHYSICAL EXAM: BP 127/81   Pulse 93   Resp 16   Wt 149 lb (67.6 kg)   SpO2 97%   BMI 24.79 kg/m   Physical Exam   General appearance - alert, well appearing, older male and in no distress Mental status - normal mood, behavior, speech, dress, motor activity, and thought processes Neck - supple, no significant adenopathy Chest -breath sounds mildly decreased bilaterally with a few scattered wheezes Heart - normal rate, regular rhythm, normal S1, S2, no murmurs, rubs, clicks or gallops Extremities - peripheral pulses normal, no pedal edema, no clubbing or cyanosis MSK: Gait is normal.  No tenderness on palpation of the lumbar spine. CMP Latest Ref  Rng & Units 04/01/2020 12/24/2018 11/12/2018  Glucose 65 - 99 mg/dL 106(H) - 107(H)  BUN 8 - 27 mg/dL 9 - 13  Creatinine 0.76 - 1.27 mg/dL 0.69(L) - 0.71  Sodium 134 - 144 mmol/L 141 - 140  Potassium 3.5 - 5.2 mmol/L 4.2 - 4.2  Chloride 96 - 106 mmol/L 102 - 108  CO2 20 - 29 mmol/L 26 - 23  Calcium 8.6 - 10.2 mg/dL 9.5 - 8.9  Total Protein 6.0 - 8.5 g/dL 6.7 6.1 -  Total Bilirubin 0.0 - 1.2 mg/dL 0.4 0.3 -  Alkaline Phos  44 - 121 IU/L 67 54 -  AST 0 - 40 IU/L 24 19 -  ALT 0 - 44 IU/L 26 12 -   Lipid Panel     Component Value Date/Time   CHOL 142 04/01/2020 1032   TRIG 137 04/01/2020 1032   HDL 56 04/01/2020 1032   CHOLHDL 2.5 04/01/2020 1032   CHOLHDL 3.5 03/10/2016 1412   VLDL 25 03/10/2016 1412   LDLCALC 62 04/01/2020 1032    CBC    Component Value Date/Time   WBC 7.6 04/01/2020 1032   WBC 5.6 11/12/2018 1227   RBC 4.91 04/01/2020 1032   RBC 4.77 11/12/2018 1227   HGB 15.9 04/01/2020 1032   HCT 48.5 04/01/2020 1032   PLT 217 04/01/2020 1032   MCV 99 (H) 04/01/2020 1032   MCH 32.4 04/01/2020 1032   MCH 32.9 11/12/2018 1227   MCHC 32.8 04/01/2020 1032   MCHC 34.2 11/12/2018 1227   RDW 12.2 04/01/2020 1032   LYMPHSABS 2.9 11/12/2018 1227   MONOABS 0.5 11/12/2018 1227   EOSABS 0.1 11/12/2018 1227   BASOSABS 0.0 11/12/2018 1227    ASSESSMENT AND PLAN: 1. Centrilobular emphysema ([place]) Patient doing well on Spiriva.  He will continue daily use of the Spiriva.  Advised to keep an albuterol inhaler on hand and use it as needed.  Encouraged him to get COVID booster.  2. Tobacco dependence Strongly advised to quit smoking.  He is aware of the health risks.  I reminded him of the lung nodules and the fact that emphysema can be worsened with continued smoking.  He requests refill on the nicotine patches - nicotine (NICODERM CQ - DOSED IN MG/24 HR) 7 mg/24hr patch; Place 1 patch (7 mg total) onto the skin daily.  Dispense: 28 patch; Refill: 0  3. Hyperlipidemia, unspecified  hyperlipidemia type Continue atorvastatin.  At goal on this medication.  4. Chronic left-sided low back pain without sciatica Patient with chronic lower back pain due to lumbar spondylosis.  He has had nerve blocks in the past through the pain specialist.  He is on tramadol and reports good relief with the medication which allows him to stay functional.  Denies any significant side effects from the medication.  [place] controlled substance reporting system reviewed.  Refill given on tramadol.  I went over the controlled substance prescribing agreement with him.  Patient to sign.  Urine drug screen done today.  Patient in agreement with this. - traMADol (ULTRAM) 50 MG tablet; Take 1 tablet (50 mg total) by mouth daily as needed. TAKE 1 TABLET(50 MG) BY MOUTH EVERY 8 HOURS AS NEEDED FOR MODERATE PAIN  Dispense: 15 tablet; Refill: 0  5. Controlled substance agreement signed See #4 above - traMADol (ULTRAM) 50 MG tablet; Take 1 tablet (50 mg total) by mouth daily as needed. TAKE 1 TABLET(50 MG) BY MOUTH EVERY 8 HOURS AS NEEDED FOR MODERATE PAIN  Dispense: 15 tablet; Refill: 0 [id number]+Oxyco+Alc+Crt-Bund    Patient was given the opportunity to ask questions.  Patient verbalized understanding of the plan and was able to repeat key elements of the plan.  [employer] Language interpreter used during this encounter. [id number], Yohannes  No orders of the defined types were placed in this encounter.    Requested Prescriptions    No prescriptions requested or ordered in this encounter    No follow-ups on file.  Karle Plumber, MD, FACP

## 2020-09-20 LAB — DRUG SCREEN 764883 11+OXYCO+ALC+CRT-BUND
Amphetamines, Urine: NEGATIVE ng/mL
BENZODIAZ UR QL: NEGATIVE ng/mL
Barbiturate: NEGATIVE ng/mL
Cannabinoid Quant, Ur: NEGATIVE ng/mL
Cocaine (Metabolite): NEGATIVE ng/mL
Creatinine: 80.1 mg/dL (ref 20.0–300.0)
Ethanol: NEGATIVE %
Meperidine: NEGATIVE ng/mL
Methadone Screen, Urine: NEGATIVE ng/mL
OPIATE SCREEN URINE: NEGATIVE ng/mL
Oxycodone/Oxymorphone, Urine: NEGATIVE ng/mL
Phencyclidine: NEGATIVE ng/mL
Propoxyphene: NEGATIVE ng/mL
pH, Urine: 6.3 (ref 4.5–8.9)

## 2020-09-20 LAB — TRAMADOL GC/MS, URINE
Tramadol gc/ms Conf: 158 ng/mL
Tramadol: POSITIVE — AB

## 2020-10-12 ENCOUNTER — Encounter (HOSPITAL_BASED_OUTPATIENT_CLINIC_OR_DEPARTMENT_OTHER): Payer: Self-pay | Admitting: Cardiology

## 2020-10-12 ENCOUNTER — Other Ambulatory Visit: Payer: Self-pay

## 2020-10-12 ENCOUNTER — Ambulatory Visit (INDEPENDENT_AMBULATORY_CARE_PROVIDER_SITE_OTHER): Payer: Medicaid Other | Admitting: Cardiology

## 2020-10-12 VITALS — BP 102/60 | HR 83 | Ht 65.0 in | Wt 148.8 lb

## 2020-10-12 DIAGNOSIS — I2584 Coronary atherosclerosis due to calcified coronary lesion: Secondary | ICD-10-CM | POA: Diagnosis not present

## 2020-10-12 DIAGNOSIS — I700 Atherosclerosis of aorta: Secondary | ICD-10-CM | POA: Diagnosis not present

## 2020-10-12 DIAGNOSIS — Z7189 Other specified counseling: Secondary | ICD-10-CM

## 2020-10-12 DIAGNOSIS — I2510 Atherosclerotic heart disease of native coronary artery without angina pectoris: Secondary | ICD-10-CM

## 2020-10-12 DIAGNOSIS — E7800 Pure hypercholesterolemia, unspecified: Secondary | ICD-10-CM

## 2020-10-12 DIAGNOSIS — F17200 Nicotine dependence, unspecified, uncomplicated: Secondary | ICD-10-CM

## 2020-10-12 NOTE — Progress Notes (Signed)
Cardiology Office Note:    Date:  10/12/2020   ID:  Kristy Nygren, DOB [birth year], MRN [id number]  PCP:  Ladell Pier, MD  Cardiologist:  Buford Dresser, MD  Referring MD: Ladell Pier, MD   CC: new patient evaluation for coronary calcium   History of Present Illness:    Nathaniel Robinson is a 65 y.o. male with a hx of GERD, COPD, aortic atherosclerosis, coronary calcium who is seen as a new consult at the request of Ladell Pier, MD for the evaluation and management of coronary artery calcification and aortic atherosclerosis.  Note from Dr. Wynetta Emery dated 09/17/20 reviewed.   Today: Interview conducted with the assistance of the telephone translator service.   We reviewed the results of his CT, which showed calcium in coronary distribution as well as in the aorta. Discussed that calcium is a marker for plaque. Reviewed risk factors for CV disease. He is a current smoker, had quit for a time but now back to smoking. Working on cessation. Denies hypertension, diabetes, kidney disease. Able to be active without cardiac/respiratory limitations. Walks 1 hour a day, with 20 minutes variance. He is limited by back pain. Previously became short of breath with exertion, but this is not an issue now.  No chest pain. Develops GI pain with coffee or spicy food.  Denies PND, orthopnea, LE edema or unexpected weight gain. No syncope or palpitations.   We reviewed treatment for atherosclerosis, see below.  Past Medical History:  Diagnosis Date   Chronic low back pain 2010    GERD (gastroesophageal reflux disease)     Past Surgical History:  Procedure Laterality Date   COLONOSCOPY N/A 05/15/2015   Procedure: COLONOSCOPY;  Surgeon: Danie Binder, MD;  [facility];  Service: Endoscopy;  Laterality: N/A;  145 - moved to 1:30 - office to notify    Current Medications: Current Outpatient Medications on File Prior to Visit  Medication Sig   atorvastatin (LIPITOR)  20 MG tablet TAKE 1 TABLET(20 MG) BY MOUTH DAILY   tiotropium (SPIRIVA) 18 MCG inhalation capsule Place 1 capsule (18 mcg total) into inhaler and inhale daily.   albuterol (PROAIR HFA) 108 (90 Base) MCG/ACT inhaler INHALE 2 PUFFS INTO THE LUNGS EVERY 6 HOURS AS NEEDED FOR WHEEZING OR SHORTNESS OF BREATH (Patient not taking: Reported on 10/12/2020)   diphenoxylate-atropine (LOMOTIL) 2.5-0.025 MG tablet Take 1 tablet by mouth 3 (three) times daily as needed for diarrhea or loose stools. (Patient not taking: Reported on 10/12/2020)   hydrocortisone valerate cream (WESTCORT) 0.2 % Apply small amount to rectal area every other day as needed (Patient not taking: Reported on 10/12/2020)   nicotine (NICODERM CQ - DOSED IN MG/24 HR) 7 mg/24hr patch Place 1 patch (7 mg total) onto the skin daily. (Patient not taking: Reported on 10/12/2020)   promethazine-dextromethorphan (PROMETHAZINE-DM) 6.25-15 MG/5ML syrup Take 5 mLs by mouth 3 (three) times daily as needed for cough. (Patient not taking: Reported on 10/12/2020)   traMADol (ULTRAM) 50 MG tablet Take 1 tablet (50 mg total) by mouth daily as needed. TAKE 1 TABLET(50 MG) BY MOUTH EVERY 8 HOURS AS NEEDED FOR MODERATE PAIN (Patient not taking: Reported on 10/12/2020)   No current facility-administered medications on file prior to visit.     Allergies:   Patient has no known allergies.   Social History   Tobacco Use   Smoking status: Former    Types: Cigarettes    Quit date: 05/15/2020    Years  since quitting: 0.4   Smokeless tobacco: Never  Vaping Use   Vaping Use: Never used  Substance Use Topics   Alcohol use: Yes    Alcohol/week: 0.0 standard drinks    Comment: social    Drug use: No    Family History: family history includes Other in his father; Ovarian cancer in his mother. There is no history of Heart disease, Hypertension, Diabetes, or Colon cancer.  ROS:   Please see the history of present illness.  Additional pertinent ROS: Constitutional:  Negative for chills, fever, night sweats, unintentional weight loss  HENT: Negative for ear pain and hearing loss.   Eyes: Negative for loss of vision and eye pain.  Respiratory: Negative for cough, sputum, wheezing.   Cardiovascular: See HPI. Gastrointestinal: Negative for abdominal pain, melena, and hematochezia.  Genitourinary: Negative for dysuria and hematuria.  Musculoskeletal: Positive for back pain. Negative for falls and myalgias.  Skin: Negative for itching and rash.  Neurological: Negative for focal weakness, focal sensory changes and loss of consciousness.  Endo/Heme/Allergies: Does not bruise/bleed easily.     EKGs/Labs/Other Studies Reviewed:    The following studies were reviewed today: CT chest 04/26/20 Cardiovascular: Aortic and coronary artery calcifications. No aneurysm. Heart is normal size.  EKG:  EKG is personally reviewed.   10/12/2020: NSR, 83 bpm  Recent Labs: 04/01/2020: ALT 26; BUN 9; Creatinine, Ser 0.69; Hemoglobin 15.9; Platelets 217; Potassium 4.2; Sodium 141 05/12/2020: BNP 5.5  Recent Lipid Panel    Component Value Date/Time   CHOL 142 04/01/2020 1032   TRIG 137 04/01/2020 1032   HDL 56 04/01/2020 1032   CHOLHDL 2.5 04/01/2020 1032   CHOLHDL 3.5 03/10/2016 1412   VLDL 25 03/10/2016 1412   LDLCALC 62 04/01/2020 1032    Physical Exam:    VS:  BP 102/60   Pulse 83   Ht '5\' 5"'$  (1.651 m)   Wt 148 lb 12.8 oz (67.5 kg)   SpO2 97%   BMI 24.76 kg/m     Wt Readings from Last 3 Encounters:  10/12/20 148 lb 12.8 oz (67.5 kg)  09/17/20 149 lb (67.6 kg)  08/14/20 146 lb 3.2 oz (66.3 kg)    GEN: Well nourished, well developed in no acute distress HEENT: Normal, moist mucous membranes NECK: No JVD CARDIAC: regular rhythm, normal S1 and S2, no rubs or gallops. No murmur. VASCULAR: Radial and DP pulses 2+ bilaterally. No carotid bruits RESPIRATORY:  wheezing in upper airways, otherwise clear to auscultation without rales, or rhonchi  ABDOMEN: Soft,  non-tender, non-distended MUSCULOSKELETAL:  Ambulates independently SKIN: Warm and dry, no edema NEUROLOGIC:  Alert and oriented x 3. No focal neuro deficits noted. PSYCHIATRIC:  Normal affect    ASSESSMENT:    1. Coronary artery calcification   2. Aortic atherosclerosis ([place])   3. Tobacco dependence   4. Cardiac risk counseling   5. Counseling on health promotion and disease prevention   6. Pure hypercholesterolemia    PLAN:    Aortic atherosclerosis Coronary artery calcification Hypercholesterolemia, LDL goal <70 -last LDL 62, at goal -continue atorvastatin -counseled on red flag warning signs that need immediate medical attention -discussed aspirin, has been having GI symptoms. Would hold on this until endoscopy is completed.  Tobacco use: encouraged cessation  Cardiac risk counseling and prevention recommendations: -recommend heart healthy/Mediterranean diet, with whole grains, fruits, vegetable, fish, lean meats, nuts, and olive oil. Limit salt. -recommend moderate walking, 3-5 times/week for 30-50 minutes each session. Aim for at least 150  minutes.week. Goal should be pace of 3 miles/hours, or walking 1.5 miles in 30 minutes -recommend avoidance of tobacco products. Avoid excess alcohol.  Plan for follow up: He is on appropriate medical therapy and is asymptomatic. I would be happy to see him back as needed  Buford Dresser, MD, PhD, [facility]    Medication Adjustments/Labs and Tests Ordered: Current medicines are reviewed at length with the patient today.  Concerns regarding medicines are outlined above.   Orders Placed This Encounter  Procedures   EKG 12-Lead    No orders of the defined types were placed in this encounter.  Patient Instructions  Medication Instructions:  Your Physician recommend you continue on your current medication as directed.    *If you need a refill on your cardiac medications before your next appointment,  please call your pharmacy*   Lab Work: None ordered today   Testing/Procedures:  None ordered today   Follow-Up: At [facility], you and your health needs are our priority.  As part of our continuing mission to provide you with exceptional heart care, we have created designated Provider Care Teams.  These Care Teams include your primary Cardiologist (physician) and Advanced Practice Providers (APPs -  Physician Assistants and Nurse Practitioners) who all work together to provide you with the care you need, when you need it.  We recommend signing up for the patient portal called "MyChart".  Sign up information is provided on this After Visit Summary.  MyChart is used to connect with patients for Virtual Visits (Telemedicine).  Patients are able to view lab/test results, encounter notes, upcoming appointments, etc.  Non-urgent messages can be sent to your provider as well.   To learn more about what you can do with MyChart, go to [email or web address].    Your next appointment:   As needed  The format for your next appointment:   In Person  Provider:   Buford Dresser, MD      [facility] Stumpf,acting as a scribe for Buford Dresser, MD.,have documented all relevant documentation on the behalf of Buford Dresser, MD,as directed by  Buford Dresser, MD while in the presence of Buford Dresser, MD.  I, Buford Dresser, MD, have reviewed all documentation for this visit. The documentation on 10/13/20 for the exam, diagnosis, procedures, and orders are all accurate and complete.   Signed, Buford Dresser, MD PhD 10/12/2020 4:44 PM    [facility]

## 2020-10-12 NOTE — Patient Instructions (Signed)

## 2020-10-13 ENCOUNTER — Encounter (HOSPITAL_BASED_OUTPATIENT_CLINIC_OR_DEPARTMENT_OTHER): Payer: Self-pay | Admitting: Cardiology

## 2020-10-23 ENCOUNTER — Other Ambulatory Visit: Payer: Self-pay | Admitting: Internal Medicine

## 2020-10-23 DIAGNOSIS — E785 Hyperlipidemia, unspecified: Secondary | ICD-10-CM

## 2020-10-23 NOTE — Telephone Encounter (Signed)
Requested medication (s) are due for refill today:   Yes  Requested medication (s) are on the active medication list:   Yes  Future visit scheduled:   Yes   Last ordered: 08/09/2019 #90, PRN refills  Returned because refills are PRN instead of a set schedule.  Dr. Wynetta Emery to review for refills.   Requested Prescriptions  Pending Prescriptions Disp Refills   atorvastatin (LIPITOR) 20 MG tablet [Pharmacy Med Name: ATORVASTATIN '20MG'$  TABLETS] 90 tablet prn    Sig: TAKE 1 TABLET(20 MG) BY MOUTH DAILY     Cardiovascular:  Antilipid - Statins Passed - 10/23/2020  3:08 AM      Passed - Total Cholesterol in normal range and within 360 days    Cholesterol, Total  Date Value Ref Range Status  04/01/2020 142 100 - 199 mg/dL Final          Passed - LDL in normal range and within 360 days    LDL Chol Calc (NIH)  Date Value Ref Range Status  04/01/2020 62 0 - 99 mg/dL Final          Passed - HDL in normal range and within 360 days    HDL  Date Value Ref Range Status  04/01/2020 56 >39 mg/dL Final          Passed - Triglycerides in normal range and within 360 days    Triglycerides  Date Value Ref Range Status  04/01/2020 137 0 - 149 mg/dL Final          Passed - Patient is not pregnant      Passed - Valid encounter within last 12 months    Recent Outpatient Visits           1 month ago Centrilobular emphysema ([place])   [place] Ladell Pier, MD   5 months ago Centrilobular emphysema [facility])   [place], MD   6 months ago Chronic left-sided low back pain without sciatica   [place], Deborah B, MD   1 year ago Viral respiratory illness   [place] Ladell Pier, MD   1 year ago Annual physical exam   [place], MD       Future Appointments             In 2  months Wynetta Emery Dalbert Batman, MD [place]

## 2020-11-20 ENCOUNTER — Telehealth: Payer: Self-pay | Admitting: Physical Medicine & Rehabilitation

## 2020-11-20 NOTE — Telephone Encounter (Signed)
Patient came to office asking for an appointment for injection. He is having pain left lower back. Please advise

## 2020-12-09 DIAGNOSIS — N5201 Erectile dysfunction due to arterial insufficiency: Secondary | ICD-10-CM | POA: Diagnosis not present

## 2020-12-09 DIAGNOSIS — R972 Elevated prostate specific antigen [PSA]: Secondary | ICD-10-CM | POA: Diagnosis not present

## 2020-12-11 ENCOUNTER — Other Ambulatory Visit: Payer: Self-pay | Admitting: Urology

## 2020-12-11 DIAGNOSIS — R972 Elevated prostate specific antigen [PSA]: Secondary | ICD-10-CM

## 2021-01-10 ENCOUNTER — Ambulatory Visit
Admission: RE | Admit: 2021-01-10 | Discharge: 2021-01-10 | Disposition: A | Discharge status: Home / Self Care | Payer: Medicaid Other | Source: Ambulatory Visit | Attending: Urology | Admitting: Urology

## 2021-01-10 ENCOUNTER — Other Ambulatory Visit: Payer: Self-pay

## 2021-01-10 DIAGNOSIS — R972 Elevated prostate specific antigen [PSA]: Secondary | ICD-10-CM

## 2021-01-10 MED ORDER — GADOBENATE DIMEGLUMINE 529 MG/ML IV SOLN
10.0000 mL | Freq: Once | INTRAVENOUS | Status: AC | PRN
  Administered 2021-01-10: 13:00:00 10 mL via INTRAVENOUS

## 2021-01-14 ENCOUNTER — Encounter: Payer: Self-pay | Admitting: Physical Medicine & Rehabilitation

## 2021-01-14 ENCOUNTER — Encounter: Payer: Medicaid Other | Attending: Physical Medicine & Rehabilitation | Admitting: Physical Medicine & Rehabilitation

## 2021-01-14 ENCOUNTER — Other Ambulatory Visit: Payer: Self-pay

## 2021-01-14 VITALS — BP 139/88 | HR 86 | Temp 98.4°F | Ht 65.0 in | Wt 150.6 lb

## 2021-01-14 DIAGNOSIS — M47816 Spondylosis without myelopathy or radiculopathy, lumbar region: Secondary | ICD-10-CM | POA: Insufficient documentation

## 2021-01-14 NOTE — Progress Notes (Signed)
  [facility]   Name: Kelii Chittum DOB:[birth year] MRN: [id number]  Date:01/14/2021  Physician: Alysia Penna, MD    Nurse/CMA: Shumaker RN  Allergies: No Known Allergies  Consent Signed: Yes.    Is patient diabetic? No.  CBG today? .  Pregnant: No. LMP: No LMP for male patient. (age 64-55)  Anticoagulants: no Anti-inflammatory: no Antibiotics: no  Procedure: left lumbar radiofrequency neurotomy L3-4-5  Position: Prone Start Time: 2:31  End Time: 2:46  Fluoro Time: 47 sec  RN/CMA Truman Hayward, CMA Shumaker RN    Time 1:53 pm 2:55    BP 139/88 132/83    Pulse 86 84    Respirations 16 16    O2 Sat 95 96    S/S 6 6    Pain Level 6/10      D/C home with bus, patient A & O X 3, D/C instructions reviewed, and sits independently.

## 2021-01-14 NOTE — Progress Notes (Signed)
Left L5 dorsal ramus., left L4 and left L3 medial branch radio frequency neurotomy under fluoroscopic guidance  Indication: Low back pain due to lumbar spondylosis which has been relieved on 2 occasions by greater than 50% by lumbar medial branch blocks at corresponding levels.  Informed consent was obtained after describing risks and benefits of the procedure with the patient, this includes bleeding, bruising, infection, paralysis and medication side effects. The patient wishes to proceed and has given written consent. The patient was placed in a prone position. The lumbar and sacral area was marked and prepped with Betadine. A 25-gauge 1-1/2 inch needle was inserted into the skin and subcutaneous tissue at 3 sites in one ML of 2% lidocaine was injected into each site. Then a 18-gauge 10 cm radio frequency needle with a 1 cm curved active tip was inserted targeting the left S1 SAP/sacral ala junction. Bone contact was made and confirmed with lateral imaging.  motor stimulation at 2 Hz confirm proper needle location followed by injection of one ML of 2% MPF lidocaine. Then the left L5 SAP/transverse process junction was targeted. Bone contact was made and confirmed with lateral imaging motor stimulation at 2 Hz confirm proper needle location followed by injection of one ML of the solution containing one ML of  2% MPF lidocaine. Then the left L4 SAP/transverse process junction was targeted. Bone contact was made and confirmed with lateral imaging. motor stimulation at 2 Hz confirm proper needle location followed by injection of one ML of the solution containing one ML of2% MPF lidocaine. Radio frequency lesion  at 80C for 90 seconds was performed. Needles were removed. Post procedure instructions and vital signs were performed. Patient tolerated procedure well. Followup appointment was given.  

## 2021-01-14 NOTE — Patient Instructions (Signed)
Call to schedule Right L3-4-5 RF if right side low back pain returns

## 2021-01-18 ENCOUNTER — Ambulatory Visit: Payer: Medicaid Other | Attending: Internal Medicine | Admitting: Internal Medicine

## 2021-01-18 ENCOUNTER — Encounter: Payer: Self-pay | Admitting: Internal Medicine

## 2021-01-18 ENCOUNTER — Other Ambulatory Visit: Payer: Self-pay

## 2021-01-18 VITALS — BP 132/75 | HR 79 | Resp 16 | Wt 152.2 lb

## 2021-01-18 DIAGNOSIS — Z87891 Personal history of nicotine dependence: Secondary | ICD-10-CM

## 2021-01-18 DIAGNOSIS — Z23 Encounter for immunization: Secondary | ICD-10-CM | POA: Diagnosis not present

## 2021-01-18 DIAGNOSIS — D170 Benign lipomatous neoplasm of skin and subcutaneous tissue of head, face and neck: Secondary | ICD-10-CM | POA: Diagnosis not present

## 2021-01-18 DIAGNOSIS — G8929 Other chronic pain: Secondary | ICD-10-CM | POA: Diagnosis not present

## 2021-01-18 DIAGNOSIS — M545 Low back pain, unspecified: Secondary | ICD-10-CM

## 2021-01-18 DIAGNOSIS — J432 Centrilobular emphysema: Secondary | ICD-10-CM

## 2021-01-18 DIAGNOSIS — Z86010 Personal history of colonic polyps: Secondary | ICD-10-CM

## 2021-01-18 DIAGNOSIS — E785 Hyperlipidemia, unspecified: Secondary | ICD-10-CM | POA: Diagnosis not present

## 2021-01-18 MED ORDER — TIOTROPIUM BROMIDE MONOHYDRATE 18 MCG IN CAPS: 18.0000 ug | ORAL_CAPSULE | Freq: Every day | RESPIRATORY_TRACT | 12 refills | Status: DC

## 2021-01-18 MED ORDER — TRAMADOL HCL 50 MG PO TABS: 50.0000 mg | ORAL_TABLET | Freq: Every day | ORAL | 1 refills | Status: DC | PRN

## 2021-01-18 NOTE — Progress Notes (Signed)
Patient ID: Nathaniel Robinson, male    DOB: [birth year]  MRN: [id number]  CC: Back Pain   Subjective: Nathaniel Robinson is a 65 y.o. male who presents for chronic ds management His concerns today include:  Pt with hx of HL, chronic LBP (lumbar spondylosis, controlled substance prescribing agreement last updated 09/2020), tob dep, GERD, peripheral neuropathy, former smoker, centrilobular emphysema, COVID 2022, lung nodules (due for repeat CT 03/2021), aortic and coronary Ca+, elev PSA (neg bx 02/2019)  Chronic lower back pain: Had flare of back pain 2 wks ago.  saw Dr. Letta Pate 01/14/2021 and had Left L5 dorsal ramus., left L4 and left L3 medial branch radio frequency neurotomy under fluoroscopic guidance.  Reports some improvement in pain post this procedure.  Out of Tramadol.  Request refill.  He takes the medication sparingly.  No significant side effects with the tramadol.  Controlled substance prescribing agreement is up-to-date.  HL:  taking and tolerating Lipitor.  Tob dep:  used the nicotine patches.  Quit smoking 3 mths ago  Emphysema:  taking the Spiriva as prescribed.  No flareup in shortness of breath or cough.  Had recent prostate MRI through urology.  Has f/u appt with them tomorrow.  Wants to know if I got the results.  I reviewed the results briefly with him telling him that it came back okay.  I have encouraged him to keep his appointment tomorrow with his urologist nonetheless.  HM:  has vaccine card with him for COVID.  Had J&J 04/16/2020.  Had flu vaccine 2 wks ago.  Pt thinks he is due for c-scope again.  Had appt with [facility] Dr. Oneida Alar several mths ago but had to cancel due to lack of transportation.  He would like to be rescheduled.  Patient Active Problem List   Diagnosis Date Noted   Controlled substance agreement signed 09/17/2020   Lung nodules 05/18/2020   Atherosclerosis of native coronary artery of native heart without angina pectoris 05/18/2020   Elevated PSA  12/26/2018   Flu vaccine need 10/08/2018   Spondylosis without myelopathy or radiculopathy, lumbar region 09/14/2018   Tobacco dependence 06/01/2018   Paresthesia 01/02/2018   Gastroesophageal reflux disease without esophagitis 09/15/2017   Idiopathic peripheral neuropathy 09/15/2017   Chronic lumbar radiculopathy 02/10/2017   Hyperlipidemia 03/14/2016   Lumbar degenerative disc disease 09/11/2015   Chronic pain syndrome 09/11/2015   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 (Patient not taking: Reported on 10/12/2020) 10 tablet 0   hydrocortisone valerate cream (WESTCORT) 0.2 % Apply small amount to rectal area every other day as needed (Patient not taking: Reported on 10/12/2020) 45 g 0   nicotine (NICODERM CQ - DOSED IN MG/24 HR) 7 mg/24hr patch Place 1 patch (7 mg total) onto the skin daily. (Patient not taking: Reported on 10/12/2020) 28 patch 0   promethazine-dextromethorphan (PROMETHAZINE-DM) 6.25-15 MG/5ML syrup Take 5 mLs by  mouth 3 (three) times daily as needed for cough. (Patient not taking: Reported on 10/12/2020) 140 mL 0   tiotropium (SPIRIVA) 18 MCG inhalation capsule Place 1 capsule (18 mcg total) into inhaler and inhale daily. 30 capsule 12   traMADol (ULTRAM) 50 MG tablet Take 1 tablet (50 mg total) by mouth daily as needed. TAKE 1 TABLET(50 MG) BY MOUTH EVERY 8 HOURS AS NEEDED FOR MODERATE PAIN (Patient not taking: Reported  on 10/12/2020) 15 tablet 0   No current facility-administered medications on file prior to visit.    No Known Allergies  Social History   Socioeconomic History   Marital status: Married    Spouse name: Not on file   Number of children: 4   Years of education: come college   Highest education level: Not on file  Occupational History   Occupation: disability    Comment: [place]   Tobacco Use   Smoking status: Former    Types: Cigarettes    Quit date: 05/15/2020    Years since quitting: 0.6   Smokeless tobacco: Never  Vaping Use   Vaping Use: Never used  Substance and Sexual Activity   Alcohol use: Yes    Alcohol/week: 0.0 standard drinks    Comment: social    Drug use: No   Sexual activity: Never  Other Topics Concern   Not on file  Social History Narrative   Form [place]   Moved to [place] in 01/2014    Lives with roommate.   Right-handed.   No caffeine use.   Wife and 4 children in [place].    Social Determinants of Health   Financial Resource Strain: Not on file  Food Insecurity: Not on file  Transportation Needs: Not on file  Physical Activity: Not on file  Stress: Not on file  Social Connections: Not on file  Intimate Partner Violence: Not on file    Family History  Problem Relation Age of Onset   Ovarian cancer Mother    Other Father        unsure of medical history   Heart disease Neg Hx    Hypertension Neg Hx    Diabetes Neg Hx    Colon cancer Neg Hx     Past Surgical History:  Procedure Laterality Date   COLONOSCOPY N/A 05/15/2015   Procedure: COLONOSCOPY;  Surgeon: Danie Binder, MD;  [facility];  Service: Endoscopy;  Laterality: N/A;  145 - moved to 1:30 - office to notify    ROS: Review of Systems Negative except as stated above  PHYSICAL EXAM: BP 132/75   Pulse 79   Resp 16   Wt 152 lb 3.2 oz (69 kg)   SpO2 97%   BMI 25.33 kg/m   Physical Exam   General appearance - alert, well appearing, and in no  distress Mental status - normal mood, behavior, speech, dress, motor activity, and thought processes Neck - supple, no significant adenopathy.  He has a 4 cm slightly raised, soft movable mass on the right side of the posterior neck.  Patient states he has had this for many years and it has not changed in size. Chest - clear to auscultation, no wheezes, rales or rhonchi, symmetric air entry Heart - normal rate, regular rhythm, normal S1, S2, no murmurs, rubs, clicks or gallops Extremities - peripheral pulses normal, no pedal edema, no clubbing or cyanosis  CMP Latest Ref Rng & Units 04/01/2020 12/24/2018 11/12/2018  Glucose 65 - 99 mg/dL 106(H) -  107(H)  BUN 8 - 27 mg/dL 9 - 13  Creatinine 0.76 - 1.27 mg/dL 0.69(L) - 0.71  Sodium 134 - 144 mmol/L 141 - 140  Potassium 3.5 - 5.2 mmol/L 4.2 - 4.2  Chloride 96 - 106 mmol/L 102 - 108  CO2 20 - 29 mmol/L 26 - 23  Calcium 8.6 - 10.2 mg/dL 9.5 - 8.9  Total Protein 6.0 - 8.5 g/dL 6.7 6.1 -  Total Bilirubin 0.0 - 1.2 mg/dL 0.4 0.3 -  Alkaline Phos 44 - 121 IU/L 67 54 -  AST 0 - 40 IU/L 24 19 -  ALT 0 - 44 IU/L 26 12 -   Lipid Panel     Component Value Date/Time   CHOL 142 04/01/2020 1032   TRIG 137 04/01/2020 1032   HDL 56 04/01/2020 1032   CHOLHDL 2.5 04/01/2020 1032   CHOLHDL 3.5 03/10/2016 1412   VLDL 25 03/10/2016 1412   LDLCALC 62 04/01/2020 1032    CBC    Component Value Date/Time   WBC 7.6 04/01/2020 1032   WBC 5.6 11/12/2018 1227   RBC 4.91 04/01/2020 1032   RBC 4.77 11/12/2018 1227   HGB 15.9 04/01/2020 1032   HCT 48.5 04/01/2020 1032   PLT 217 04/01/2020 1032   MCV 99 (H) 04/01/2020 1032   MCH 32.4 04/01/2020 1032   MCH 32.9 11/12/2018 1227   MCHC 32.8 04/01/2020 1032   MCHC 34.2 11/12/2018 1227   RDW 12.2 04/01/2020 1032   LYMPHSABS 2.9 11/12/2018 1227   MONOABS 0.5 11/12/2018 1227   EOSABS 0.1 11/12/2018 1227   BASOSABS 0.0 11/12/2018 1227    ASSESSMENT AND PLAN: 1. Centrilobular emphysema ([facility]) Controlled on  Spiriva.  Refill given. - tiotropium (SPIRIVA) 18 MCG inhalation capsule; Place 1 capsule (18 mcg total) into inhaler and inhale daily.  Dispense: 30 capsule; Refill: 12  2. Former smoker Commended him on quitting.  Encouraged him to remain tobacco free.  3. Hyperlipidemia, unspecified hyperlipidemia type Continue atorvastatin.  He will be due for lipid profile and LFTs on next visit.  4. Chronic left-sided low back pain without sciatica Patient reports good relief of pain with tramadol.  No significant side effects from the medication.  He is functional.  Review of [place] controlled substance reporting system seems appropriate.  No aberrant behavior.  Refill sent to his pharmacy on tramadol - traMADol (ULTRAM) 50 MG tablet; Take 1 tablet (50 mg total) by mouth daily as needed. TAKE 1 TABLET(50 MG) BY MOUTH EVERY 8 HOURS AS NEEDED FOR MODERATE PAIN  Dispense: 30 tablet; Refill: 1  5. History of colon polyps - Ambulatory referral to Gastroenterology  6. Lipoma of neck -We will keep an eye on this and observe for any growth  7. Need for vaccination against Streptococcus pneumoniae - Pneumococcal conjugate vaccine 20-valent     Patient was given the opportunity to ask questions.  Patient verbalized understanding of the plan and was able to repeat key elements of the plan.  [employer] Language interpreter used during this encounter. #[id number]  No orders of the defined types were placed in this encounter.    Requested Prescriptions    No prescriptions requested or ordered in this encounter    No follow-ups on file.  Karle Plumber, MD, FACP

## 2021-01-19 DIAGNOSIS — R972 Elevated prostate specific antigen [PSA]: Secondary | ICD-10-CM | POA: Diagnosis not present

## 2021-01-19 DIAGNOSIS — N5201 Erectile dysfunction due to arterial insufficiency: Secondary | ICD-10-CM | POA: Diagnosis not present

## 2021-01-20 ENCOUNTER — Encounter: Payer: Self-pay | Admitting: Internal Medicine

## 2021-01-21 ENCOUNTER — Telehealth: Payer: Self-pay | Admitting: Internal Medicine

## 2021-01-21 DIAGNOSIS — G8929 Other chronic pain: Secondary | ICD-10-CM

## 2021-01-21 DIAGNOSIS — M545 Low back pain, unspecified: Secondary | ICD-10-CM

## 2021-01-21 MED ORDER — TRAMADOL HCL 50 MG PO TABS: 50.0000 mg | ORAL_TABLET | Freq: Every day | ORAL | 1 refills | Status: DC | PRN

## 2021-01-21 NOTE — Telephone Encounter (Signed)
Will forward to provider to send update rx with directions

## 2021-01-21 NOTE — Telephone Encounter (Signed)
Pharmacy needs clarity on Tramadol directions/ RX has 2 sets of directions/ please advise asap

## 2021-01-21 NOTE — Telephone Encounter (Signed)
I have resent the tramadol prescription with the updated directions of 1 tablet daily as needed for pain.

## 2021-01-22 ENCOUNTER — Other Ambulatory Visit: Payer: Self-pay | Admitting: Internal Medicine

## 2021-01-22 DIAGNOSIS — E785 Hyperlipidemia, unspecified: Secondary | ICD-10-CM

## 2021-01-27 ENCOUNTER — Encounter: Payer: Self-pay | Admitting: Internal Medicine

## 2021-01-27 DIAGNOSIS — N529 Male erectile dysfunction, unspecified: Secondary | ICD-10-CM | POA: Insufficient documentation

## 2021-03-11 ENCOUNTER — Telehealth: Payer: Self-pay | Admitting: *Deleted

## 2021-03-11 ENCOUNTER — Ambulatory Visit: Payer: Medicaid Other

## 2021-03-11 ENCOUNTER — Ambulatory Visit (INDEPENDENT_AMBULATORY_CARE_PROVIDER_SITE_OTHER): Payer: Self-pay | Admitting: *Deleted

## 2021-03-11 ENCOUNTER — Other Ambulatory Visit: Payer: Self-pay

## 2021-03-11 VITALS — Ht 65.0 in | Wt 147.7 lb

## 2021-03-11 DIAGNOSIS — Z86010 Personal history of colonic polyps: Secondary | ICD-10-CM

## 2021-03-11 NOTE — Progress Notes (Signed)
Ok to schedule. ASA 2.  

## 2021-03-11 NOTE — Telephone Encounter (Signed)
Called Interpreter line but pt did not answer.  Interpreter left a voice message for pt to call us back.

## 2021-03-11 NOTE — Progress Notes (Addendum)
Gastroenterology Pre-Procedure Review  Request Date: 03/11/2021 Requesting Physician: Dr. Karle Plumber @ [facility], Last TCS done 05/15/2015 by Dr. Oneida Alar, hyperplastic polyps (x11), tubular adenoma (x2)  PATIENT REVIEW QUESTIONS: The patient responded to the following health history questions as indicated:    1. Diabetes Melitis: no 2. Joint replacements in the past 12 months: no 3. Major health problems in the past 3 months: no 4. Has an artificial valve or MVP: no 5. Has a defibrillator: no 6. Has been advised in past to take antibiotics in advance of a procedure like teeth cleaning: no 7. Family history of colon cancer: no 8. Alcohol Use: no 9. Illicit drug Use: no 10. History of sleep apnea: no  11. History of coronary artery or other vascular stents placed within the last 12 months: no 12. History of any prior anesthesia complications: no 13. Body mass index is 24.58 kg/m.    MEDICATIONS & ALLERGIES:    Patient reports the following regarding taking any blood thinners:   Plavix? no Aspirin? no Coumadin? no Brilinta? no Xarelto? no Eliquis? no Pradaxa? no Savaysa? no Effient? no  Patient confirms/reports the following medications:  Current Outpatient Medications  Medication Sig Dispense Refill   albuterol (PROAIR HFA) 108 (90 Base) MCG/ACT inhaler INHALE 2 PUFFS INTO THE LUNGS EVERY 6 HOURS AS NEEDED FOR WHEEZING OR SHORTNESS OF BREATH 8.5 g 2   atorvastatin (LIPITOR) 20 MG tablet TAKE 1 TABLET(20 MG) BY MOUTH DAILY 90 tablet 1   traMADol (ULTRAM) 50 MG tablet Take 1 tablet (50 mg total) by mouth daily as needed. 30 tablet 1   No current facility-administered medications for this visit.    Patient confirms/reports the following allergies:  No Known Allergies  No orders of the defined types were placed in this encounter.   AUTHORIZATION INFORMATION Primary Insurance: [place] [employer] [facility],  [place] #: [id number],  Group #: [facility] Pre-Cert / Auth  required: Yes, approved online 9/62/2297-9/89/2119 Pre-Cert / Auth #: [id number]  SCHEDULE INFORMATION: Procedure has been scheduled as follows:  Date: 03/30/2021, Time: 10:00 Location: [facility] with Dr. Abbey Chatters  This Gastroenterology Pre-Precedure Review Form is being routed to the following provider(s): Neil Crouch, PA-C

## 2021-03-15 ENCOUNTER — Encounter: Payer: Self-pay | Admitting: *Deleted

## 2021-03-15 MED ORDER — NA SULFATE-K SULFATE-MG SULF 17.5-3.13-1.6 GM/177ML PO SOLN
1.0000 | Freq: Once | ORAL | 0 refills | Status: AC
Start: 2021-03-15 — End: 2021-03-15

## 2021-03-15 NOTE — Telephone Encounter (Signed)
Spoke to pt with interpreter.  Mailed and emailed prep instructions to pt.

## 2021-03-15 NOTE — Progress Notes (Signed)
Spoke with pt and interpreter.  Scheduled procedure for 03/30/2021 with arrival at 8:30 at [facility].  Reviewed prep instructions by telephone with the help of interpreter.  Confirmed mailing address and pt requested instructions to be emailed as well to [email or web address].  Pt said that it was ok to send instructions in [place].  He has someone to help him.

## 2021-03-15 NOTE — Addendum Note (Signed)
Addended by: Metro Kung on: 03/15/2021 12:57 PM   Modules accepted: Orders

## 2021-03-18 ENCOUNTER — Other Ambulatory Visit: Payer: Self-pay | Admitting: *Deleted

## 2021-03-30 ENCOUNTER — Other Ambulatory Visit: Payer: Self-pay

## 2021-03-30 ENCOUNTER — Ambulatory Visit (HOSPITAL_COMMUNITY)
Admission: RE | Admit: 2021-03-30 | Discharge: 2021-03-30 | Disposition: A | Discharge status: Home / Self Care | Payer: Medicaid Other | Attending: Internal Medicine | Admitting: Internal Medicine

## 2021-03-30 ENCOUNTER — Encounter (HOSPITAL_COMMUNITY): Payer: Self-pay

## 2021-03-30 ENCOUNTER — Ambulatory Visit (HOSPITAL_COMMUNITY): Payer: Medicaid Other | Admitting: Anesthesiology

## 2021-03-30 ENCOUNTER — Ambulatory Visit (HOSPITAL_BASED_OUTPATIENT_CLINIC_OR_DEPARTMENT_OTHER): Payer: Medicaid Other | Admitting: Anesthesiology

## 2021-03-30 ENCOUNTER — Encounter (HOSPITAL_COMMUNITY)
Admission: RE | Disposition: A | Discharge status: Home / Self Care | Payer: Self-pay | Source: Home / Self Care | Attending: Internal Medicine

## 2021-03-30 DIAGNOSIS — K635 Polyp of colon: Secondary | ICD-10-CM

## 2021-03-30 DIAGNOSIS — Z87891 Personal history of nicotine dependence: Secondary | ICD-10-CM | POA: Insufficient documentation

## 2021-03-30 DIAGNOSIS — K5730 Diverticulosis of large intestine without perforation or abscess without bleeding: Secondary | ICD-10-CM | POA: Insufficient documentation

## 2021-03-30 DIAGNOSIS — Z86010 Personal history of colonic polyps: Secondary | ICD-10-CM

## 2021-03-30 DIAGNOSIS — K648 Other hemorrhoids: Secondary | ICD-10-CM | POA: Diagnosis not present

## 2021-03-30 DIAGNOSIS — Z1211 Encounter for screening for malignant neoplasm of colon: Secondary | ICD-10-CM | POA: Insufficient documentation

## 2021-03-30 DIAGNOSIS — K219 Gastro-esophageal reflux disease without esophagitis: Secondary | ICD-10-CM | POA: Diagnosis not present

## 2021-03-30 DIAGNOSIS — I2510 Atherosclerotic heart disease of native coronary artery without angina pectoris: Secondary | ICD-10-CM | POA: Diagnosis not present

## 2021-03-30 HISTORY — PX: POLYPECTOMY: SHX5525

## 2021-03-30 HISTORY — PX: COLONOSCOPY WITH PROPOFOL: SHX5780

## 2021-03-30 SURGERY — COLONOSCOPY WITH PROPOFOL
Anesthesia: General

## 2021-03-30 MED ORDER — PHENYLEPHRINE 40 MCG/ML (10ML) SYRINGE FOR IV PUSH (FOR BLOOD PRESSURE SUPPORT)
PREFILLED_SYRINGE | INTRAVENOUS | Status: DC | PRN
  Administered 2021-03-30: 40 ug via INTRAVENOUS
  Administered 2021-03-30: 80 ug via INTRAVENOUS

## 2021-03-30 MED ORDER — PROPOFOL 10 MG/ML IV BOLUS
INTRAVENOUS | Status: DC | PRN
  Administered 2021-03-30: 100 mg via INTRAVENOUS
  Administered 2021-03-30 (×2): 50 mg via INTRAVENOUS

## 2021-03-30 MED ORDER — LACTATED RINGERS IV SOLN: INTRAVENOUS | Status: DC

## 2021-03-30 MED ORDER — LIDOCAINE HCL (CARDIAC) PF 100 MG/5ML IV SOSY
PREFILLED_SYRINGE | INTRAVENOUS | Status: DC | PRN
Start: 2021-03-30 — End: 2021-03-30
  Administered 2021-03-30: 50 mg via INTRAVENOUS

## 2021-03-30 MED ORDER — PHENYLEPHRINE 40 MCG/ML (10ML) SYRINGE FOR IV PUSH (FOR BLOOD PRESSURE SUPPORT)
PREFILLED_SYRINGE | INTRAVENOUS | Status: AC
  Filled 2021-03-30: qty 10

## 2021-03-30 MED ORDER — LACTATED RINGERS IV SOLN
INTRAVENOUS | Status: DC | PRN
Start: 2021-03-30 — End: 2021-03-30

## 2021-03-30 NOTE — Transfer of Care (Signed)
Immediate Anesthesia Transfer of Care Note  Patient: Nathaniel Robinson  Procedure(s) Performed: COLONOSCOPY WITH PROPOFOL POLYPECTOMY  Patient Location: Endoscopy Unit  Anesthesia Type:General  Level of Consciousness: awake  Airway & Oxygen Therapy: Patient Spontanous Breathing  Post-op Assessment: Report given to RN and Post -op Vital signs reviewed and stable  Post vital signs: Reviewed and stable  Last Vitals:  Vitals Value Taken Time  BP    Temp    Pulse    Resp    SpO2      Last Pain:  Vitals:   03/30/21 0949  TempSrc:   PainSc: 0-No pain      Patients Stated Pain Goal: 3 (11/65/79 0383)  Complications: No notable events documented.

## 2021-03-30 NOTE — H&P (Signed)
Primary Care Physician:  Ladell Pier, MD Primary Gastroenterologist:  Dr. Abbey Chatters  Pre-Procedure History & Physical: HPI:  Nathaniel Robinson is a 65 y.o. male is here for a colonoscopy to be performed for surveillance purposes. Last TCS done 05/15/2015 by Dr. Oneida Alar, hyperplastic polyps (x11), tubular adenoma (x2)  Past Medical History:  Diagnosis Date   Chronic low back pain 2010    GERD (gastroesophageal reflux disease)     Past Surgical History:  Procedure Laterality Date   COLONOSCOPY N/A 05/15/2015   Procedure: COLONOSCOPY;  Surgeon: Danie Binder, MD;  Location: [facility];  Service: Endoscopy;  Laterality: N/A;  145 - moved to 1:30 - office to notify    Prior to Admission medications   Medication Sig Start Date End Date Taking? Authorizing Provider  atorvastatin (LIPITOR) 20 MG tablet TAKE 1 TABLET(20 MG) BY MOUTH DAILY 01/22/21  Yes Ladell Pier, MD  tiotropium (SPIRIVA) 18 MCG inhalation capsule Place 18 mcg into inhaler and inhale daily.   Yes [provider]  albuterol (PROAIR HFA) 108 (90 Base) MCG/ACT inhaler INHALE 2 PUFFS INTO THE LUNGS EVERY 6 HOURS AS NEEDED FOR WHEEZING OR SHORTNESS OF BREATH Patient not taking: Reported on 03/25/2021 07/29/20   Ladell Pier, MD  traMADol (ULTRAM) 50 MG tablet Take 1 tablet (50 mg total) by mouth daily as needed. Patient not taking: Reported on 03/25/2021 01/21/21   Ladell Pier, MD    Allergies as of 03/15/2021   (No Known Allergies)    Family History  Problem Relation Age of Onset   Ovarian cancer Mother    Other Father        unsure of medical history   Heart disease Neg Hx    Hypertension Neg Hx    Diabetes Neg Hx    Colon cancer Neg Hx     Social History   Socioeconomic History   Marital status: Married    Spouse name: Not on file   Number of children: 4   Years of education: come college   Highest education level: Not on file  Occupational History   Occupation: disability    Comment:  [place]   Tobacco Use   Smoking status: Former    Types: Cigarettes    Quit date: 05/15/2020    Years since quitting: 0.8   Smokeless tobacco: Never  Vaping Use   Vaping Use: Never used  Substance and Sexual Activity   Alcohol use: Yes    Alcohol/week: 0.0 standard drinks    Comment: social    Drug use: No   Sexual activity: Never  Other Topics Concern   Not on file  Social History Narrative   Form [place]   Moved to [place] in 01/2014    Lives with roommate.   Right-handed.   No caffeine use.   Wife and 4 children in [place].    Social Determinants of Health   Financial Resource Strain: Not on file  Food Insecurity: Not on file  Transportation Needs: Not on file  Physical Activity: Not on file  Stress: Not on file  Social Connections: Not on file  Intimate Partner Violence: Not on file    Review of Systems: See HPI, otherwise negative ROS  Physical Exam: Vital signs in last 24 hours: Temp:  [97.8 F (36.6 C)] 97.8 F (36.6 C) (02/14 0852) Pulse Rate:  [84] 84 (02/14 0852) Resp:  [18] 18 (02/14 0852) BP: (126)/(73) 126/73 (02/14 0852) SpO2:  [99 %] 99 % (  02/14 1572)   General:   Alert,  Well-developed, well-nourished, pleasant and cooperative in NAD Head:  Normocephalic and atraumatic. Eyes:  Sclera clear, no icterus.   Conjunctiva pink. Ears:  Normal auditory acuity. Nose:  No deformity, discharge,  or lesions. Mouth:  No deformity or lesions, dentition normal. Neck:  Supple; no masses or thyromegaly. Lungs:  Clear throughout to auscultation.   No wheezes, crackles, or rhonchi. No acute distress. Heart:  Regular rate and rhythm; no murmurs, clicks, rubs,  or gallops. Abdomen:  Soft, nontender and nondistended. No masses, hepatosplenomegaly or hernias noted. Normal bowel sounds, without guarding, and without rebound.   Msk:  Symmetrical without gross deformities. Normal posture. Extremities:  Without clubbing or edema. Neurologic:  Alert and  oriented x4;   grossly normal neurologically. Skin:  Intact without significant lesions or rashes. Cervical Nodes:  No significant cervical adenopathy. Psych:  Alert and cooperative. Normal mood and affect.  Impression/Plan: Nathaniel Robinson is here for a colonoscopy to be performed for surveillance purposes. Last TCS done 05/15/2015 by Dr. Oneida Alar, hyperplastic polyps (x11), tubular adenoma (x2)  The risks of the procedure including infection, bleed, or perforation as well as benefits, limitations, alternatives and imponderables have been reviewed with the patient. Questions have been answered. All parties agreeable.  IPAD interpreter used during pre op while explaining risks and benefits of procedure.

## 2021-03-30 NOTE — Anesthesia Procedure Notes (Signed)
Date/Time: 03/30/2021 9:54 AM Performed by: Orlie Dakin, CRNA Pre-anesthesia Checklist: Patient identified, Emergency Drugs available, Suction available and Patient being monitored Patient Re-evaluated:Patient Re-evaluated prior to induction Oxygen Delivery Method: Nasal cannula Induction Type: IV induction Placement Confirmation: positive ETCO2

## 2021-03-30 NOTE — OR Nursing (Incomplete)
Interpreter [phone] yodi used for assessment

## 2021-03-30 NOTE — Op Note (Signed)
[facility] Patient Name: Nathaniel Robinson Procedure Date: 03/30/2021 9:46 AM MRN: [id number] Date of Birth: April 30, 1955 Attending MD: Elon Alas. Abbey Chatters DO CSN: [id number] Age: 66 Admit Type: Outpatient Procedure:                Colonoscopy Indications:              Surveillance: Personal history of adenomatous                            polyps on last colonoscopy 5 years ago Providers:                Elon Alas. Abbey Chatters, DO, Caprice Kluver, Casimer Bilis, Technician Referring MD:              Medicines:                See the Anesthesia note for documentation of the                            administered medications Complications:            No immediate complications. Estimated Blood Loss:     Estimated blood loss was minimal. Procedure:                Pre-Anesthesia Assessment:                           - The anesthesia plan was to use monitored                            anesthesia care (MAC).                           After obtaining informed consent, the colonoscope                            was passed under direct vision. Throughout the                            procedure, the patient's blood pressure, pulse, and                            oxygen saturations were monitored continuously. The                            [id number]-HQ190L ([id number]) scope was introduced through                            the anus and advanced to the the cecum, identified                            by appendiceal orifice and ileocecal valve. The                            colonoscopy was performed without difficulty.  The                            patient tolerated the procedure well. The quality                            of the bowel preparation was evaluated using the                            BBPS [facility] Bowel Preparation Scale) with scores                            of: Right Colon = 3, Transverse Colon = 3 and Left                            Colon = 3 (entire mucosa  seen well with no residual                            staining, small fragments of stool or opaque                            liquid). The total BBPS score equals 9. Scope In: 9:53:22 AM Scope Out: 10:08:24 AM Scope Withdrawal Time: 0 hours 12 minutes 32 seconds  Total Procedure Duration: 0 hours 15 minutes 2 seconds  Findings:      The perianal and digital rectal examinations were normal.      Non-bleeding internal hemorrhoids were found during endoscopy.      Multiple small and large-mouthed diverticula were found in the sigmoid       colon and descending colon.      A 2 mm polyp was found in the ascending colon. The polyp was sessile.       The polyp was removed with a cold biopsy forceps. Resection and       retrieval were complete.      Two sessile polyps were found in the transverse colon. The polyps were 4       to 6 mm in size. These polyps were removed with a cold snare. Resection       and retrieval were complete.      Seven sessile polyps were found in the sigmoid colon. The polyps were 3       to 6 mm in size. These polyps were removed with a cold snare. Resection       and retrieval were complete.      The exam was otherwise without abnormality. Impression:               - Non-bleeding internal hemorrhoids.                           - Diverticulosis in the sigmoid colon and in the                            descending colon.                           - One 2 mm polyp in the ascending colon, removed  with a cold biopsy forceps. Resected and retrieved.                           - Two 4 to 6 mm polyps in the transverse colon,                            removed with a cold snare. Resected and retrieved.                           - Seven 3 to 6 mm polyps in the sigmoid colon,                            removed with a cold snare. Resected and retrieved.                           - The examination was otherwise normal. Moderate Sedation:      Per  Anesthesia Care Recommendation:           - Patient has a contact number available for                            emergencies. The signs and symptoms of potential                            delayed complications were discussed with the                            patient. Return to normal activities tomorrow.                            Written discharge instructions were provided to the                            patient.                           - Resume previous diet.                           - Continue present medications.                           - Await pathology results.                           - Repeat colonoscopy in 3 - 5 years for                            surveillance.                           - Return to GI clinic PRN. Procedure Code(s):        --- Professional ---                           [phone],  Colonoscopy, flexible; with removal of                            tumor(s), polyp(s), or other lesion(s) by snare                            technique                           45380, 59, Colonoscopy, flexible; with biopsy,                            single or multiple Diagnosis Code(s):        --- Professional ---                           K63.5, Polyp of colon                           Z86.010, Personal history of colonic polyps                           K64.8, Other hemorrhoids                           K57.30, Diverticulosis of large intestine without                            perforation or abscess without bleeding CPT copyright 2019 American Medical Association. All rights reserved. The codes documented in this report are preliminary and upon coder review may  be revised to meet current compliance requirements. Elon Alas. Abbey Chatters, DO [place] Abbey Chatters, DO 03/30/2021 10:10:58 AM This report has been signed electronically. Number of Addenda: 0

## 2021-03-30 NOTE — Discharge Instructions (Addendum)
°  Colonoscopy Discharge Instructions  Read the instructions outlined below and refer to this sheet in the next few weeks. These discharge instructions provide you with general information on caring for yourself after you leave the hospital. Your doctor may also give you specific instructions. While your treatment has been planned according to the most current medical practices available, unavoidable complications occasionally occur.   ACTIVITY You may resume your regular activity, but move at a slower pace for the next 24 hours.  Take frequent rest periods for the next 24 hours.  Walking will help get rid of the air and reduce the bloated feeling in your belly (abdomen).  No driving for 24 hours (because of the medicine (anesthesia) used during the test).   Do not sign any important legal documents or operate any machinery for 24 hours (because of the anesthesia used during the test).  NUTRITION Drink plenty of fluids.  You may resume your normal diet as instructed by your doctor.  Begin with a light meal and progress to your normal diet. Heavy or fried foods are harder to digest and may make you feel sick to your stomach (nauseated).  Avoid alcoholic beverages for 24 hours or as instructed.  MEDICATIONS You may resume your normal medications unless your doctor tells you otherwise.  WHAT YOU CAN EXPECT TODAY Some feelings of bloating in the abdomen.  Passage of more gas than usual.  Spotting of blood in your stool or on the toilet paper.  IF YOU HAD POLYPS REMOVED DURING THE COLONOSCOPY: No aspirin products for 7 days or as instructed.  No alcohol for 7 days or as instructed.  Eat a soft diet for the next 24 hours.  FINDING OUT THE RESULTS OF YOUR TEST Not all test results are available during your visit. If your test results are not back during the visit, make an appointment with your caregiver to find out the results. Do not assume everything is normal if you have not heard from your  caregiver or the medical facility. It is important for you to follow up on all of your test results.  SEEK IMMEDIATE MEDICAL ATTENTION IF: You have more than a spotting of blood in your stool.  Your belly is swollen (abdominal distention).  You are nauseated or vomiting.  You have a temperature over 101.  You have abdominal pain or discomfort that is severe or gets worse throughout the day.   Your colonoscopy revealed 10 polyp(s) which I removed successfully. The majority of these looked benign, hyperplastic. Await pathology results, my office will contact you. I recommend repeating colonoscopy in 5 years for surveillance purposes. Otherwise follow up with GI as needed.    I hope you have a great rest of your week!  Elon Alas. Abbey Chatters, D.O. Gastroenterology and Hepatology [facility]

## 2021-03-30 NOTE — Anesthesia Preprocedure Evaluation (Signed)
Anesthesia Evaluation  Patient identified by MRN, date of birth, ID band Patient awake    Reviewed: Allergy & Precautions, H&P , NPO status , Patient's Chart, lab work & pertinent test results, reviewed documented beta blocker date and time   Airway Mallampati: II  TM Distance: >3 FB Neck ROM: full    Dental no notable dental hx.    Pulmonary neg pulmonary ROS, former smoker,    Pulmonary exam normal breath sounds clear to auscultation       Cardiovascular Exercise Tolerance: Good + CAD   Rhythm:regular Rate:Normal     Neuro/Psych  Neuromuscular disease negative psych ROS   GI/Hepatic Neg liver ROS, GERD  Medicated,  Endo/Other  negative endocrine ROS  Renal/GU negative Renal ROS  negative genitourinary   Musculoskeletal   Abdominal   Peds  Hematology negative hematology ROS (+)   Anesthesia Other Findings   Reproductive/Obstetrics negative OB ROS                             Anesthesia Physical Anesthesia Plan  ASA: 3  Anesthesia Plan: General   Post-op Pain Management:    Induction:   PONV Risk Score and Plan: Propofol infusion  Airway Management Planned:   Additional Equipment:   Intra-op Plan:   Post-operative Plan:   Informed Consent: I have reviewed the patients History and Physical, chart, labs and discussed the procedure including the risks, benefits and alternatives for the proposed anesthesia with the patient or authorized representative who has indicated his/her understanding and acceptance.     Dental Advisory Given  Plan Discussed with: CRNA  Anesthesia Plan Comments:         Anesthesia Quick Evaluation

## 2021-03-30 NOTE — Anesthesia Postprocedure Evaluation (Signed)
Anesthesia Post Note  Patient: Nathaniel Robinson  Procedure(s) Performed: COLONOSCOPY WITH PROPOFOL POLYPECTOMY  Patient location during evaluation: Phase II Anesthesia Type: General Level of consciousness: awake Pain management: pain level controlled Vital Signs Assessment: post-procedure vital signs reviewed and stable Respiratory status: spontaneous breathing and respiratory function stable Cardiovascular status: blood pressure returned to baseline and stable Postop Assessment: no headache and no apparent nausea or vomiting Anesthetic complications: no Comments: Late entry   No notable events documented.   Last Vitals:  Vitals:   03/30/21 1010 03/30/21 1020  BP: (!) 91/52 99/61  Pulse: 74 73  Resp: (!) 21 18  Temp: 36.5 C   SpO2: 98% 98%    Last Pain:  Vitals:   03/30/21 1020  TempSrc:   PainSc: 0-No pain                 Louann Sjogren

## 2021-04-01 LAB — SURGICAL PATHOLOGY

## 2021-04-02 ENCOUNTER — Encounter (HOSPITAL_COMMUNITY): Payer: Self-pay | Admitting: Internal Medicine

## 2021-04-13 ENCOUNTER — Other Ambulatory Visit: Payer: Self-pay

## 2021-04-13 ENCOUNTER — Ambulatory Visit (HOSPITAL_COMMUNITY)
Admission: EM | Admit: 2021-04-13 | Discharge: 2021-04-13 | Disposition: A | Discharge status: Home / Self Care | Payer: Medicaid Other | Attending: Emergency Medicine | Admitting: Emergency Medicine

## 2021-04-13 ENCOUNTER — Encounter (HOSPITAL_COMMUNITY): Payer: Self-pay | Admitting: Emergency Medicine

## 2021-04-13 DIAGNOSIS — M545 Low back pain, unspecified: Secondary | ICD-10-CM | POA: Diagnosis not present

## 2021-04-13 DIAGNOSIS — G8929 Other chronic pain: Secondary | ICD-10-CM

## 2021-04-13 MED ORDER — MELOXICAM 15 MG PO TABS: 15.0000 mg | ORAL_TABLET | Freq: Every day | ORAL | 0 refills | Status: DC

## 2021-04-13 MED ORDER — KETOROLAC TROMETHAMINE 30 MG/ML IJ SOLN
INTRAMUSCULAR | Status: AC
  Filled 2021-04-13: qty 1

## 2021-04-13 MED ORDER — KETOROLAC TROMETHAMINE 30 MG/ML IJ SOLN
30.0000 mg | Freq: Once | INTRAMUSCULAR | Status: AC
  Administered 2021-04-13: 30 mg via INTRAMUSCULAR

## 2021-04-13 MED ORDER — CYCLOBENZAPRINE HCL 10 MG PO TABS: 10.0000 mg | ORAL_TABLET | Freq: Two times a day (BID) | ORAL | 0 refills | Status: DC | PRN

## 2021-04-13 NOTE — ED Triage Notes (Signed)
Pt reports lower back pain and muscle strain x 1 week. Pt states he has difficulty bending and pain when lying down.

## 2021-04-13 NOTE — Discharge Instructions (Signed)
Your pain is most likely caused by irritation to the muscles or ligaments.   Take meloxicam every morning with food for the next 7 days then you may use as needed  You may use muscle relaxer twice daily as needed for additional comfort, be mindful of this medication make you drowsy  You may use heating pad in 15 minute intervals as needed for additional comfort,or you may find comfort in using ice in 10-15 minutes over affected area  Begin stretching affected area daily for 10 minutes as tolerated to further loosen muscles   When lying down place pillow underneath and between knees for support  Can try sleeping without pillow on firm mattress   Practice good posture: head back, shoulders back, chest forward, pelvis back and weight distributed evenly on both legs  If pain persist after recommended treatment or reoccurs if may be beneficial to follow up with orthopedic specialist for evaluation, this doctor specializes in the bones and can manage your symptoms long-term with options such as but not limited to imaging, medications or physical therapy

## 2021-04-13 NOTE — ED Provider Notes (Signed)
[place]    CSN: [id number] Arrival date & time: 04/13/21  1048      History   Chief Complaint Chief Complaint  Patient presents with   Back Pain    HPI Nathaniel Robinson is a 66 y.o. male.   Presents with bilateral lower back pain for 1 week.  Symptoms are worsening with twisting, turning and bending.  Described as a soreness, radiates a 5 out of 10.  Denies numbness or tingling precipitating event or trauma.  Has not attempted treatment of symptoms.  History of chronic lumbar radiculopathy, spondylosis and sacroiliac joint disease, lumbar degenerative disc disease.  Unable to locate interpreter after several attempts, family member use  Past Medical History:  Diagnosis Date   Chronic low back pain 2010    GERD (gastroesophageal reflux disease)     Patient Active Problem List   Diagnosis Date Noted   ED (erectile dysfunction) 01/27/2021   Controlled substance agreement signed 09/17/2020   Lung nodules 05/18/2020   Atherosclerosis of native coronary artery of native heart without angina pectoris 05/18/2020   Elevated PSA 12/26/2018   Flu vaccine need 10/08/2018   Spondylosis without myelopathy or radiculopathy, lumbar region 09/14/2018   Tobacco dependence 06/01/2018   Paresthesia 01/02/2018   Gastroesophageal reflux disease without esophagitis 09/15/2017   Idiopathic peripheral neuropathy 09/15/2017   Chronic lumbar radiculopathy 02/10/2017   Hyperlipidemia 03/14/2016   Lumbar degenerative disc disease 09/11/2015   Chronic pain syndrome 09/11/2015   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   Chronic low back pain     Past Surgical History:  Procedure Laterality Date   COLONOSCOPY N/A 05/15/2015   Procedure: COLONOSCOPY;  Surgeon: Danie Binder, MD;  [facility];  Service: Endoscopy;  Laterality: N/A;  145 - moved to 1:30 - office to notify   COLONOSCOPY WITH PROPOFOL N/A 03/30/2021   Procedure: COLONOSCOPY WITH  PROPOFOL;  Surgeon: Eloise Harman, DO;  [facility];  Service: Endoscopy;  Laterality: N/A;  10:00 / ASA 2   POLYPECTOMY  03/30/2021   Procedure: POLYPECTOMY;  Surgeon: Eloise Harman, DO;  [facility];  Service: Endoscopy;;       Home Medications    Prior to Admission medications   Medication Sig Start Date End Date Taking? Authorizing Provider  albuterol (PROAIR HFA) 108 (90 Base) MCG/ACT inhaler INHALE 2 PUFFS INTO THE LUNGS EVERY 6 HOURS AS NEEDED FOR WHEEZING OR SHORTNESS OF BREATH Patient not taking: Reported on 03/25/2021 07/29/20   Ladell Pier, MD  atorvastatin (LIPITOR) 20 MG tablet TAKE 1 TABLET(20 MG) BY MOUTH DAILY 01/22/21   Ladell Pier, MD  tiotropium (SPIRIVA) 18 MCG inhalation capsule Place 18 mcg into inhaler and inhale daily.    [provider]  traMADol (ULTRAM) 50 MG tablet Take 1 tablet (50 mg total) by mouth daily as needed. Patient not taking: Reported on 03/25/2021 01/21/21   Ladell Pier, MD    Family History Family History  Problem Relation Age of Onset   Ovarian cancer Mother    Other Father        unsure of medical history   Heart disease Neg Hx    Hypertension Neg Hx    Diabetes Neg Hx    Colon cancer Neg Hx     Social History Social History   Tobacco Use   Smoking status: Former    Types: Cigarettes    Quit date: 05/15/2020  Years since quitting: 0.9   Smokeless tobacco: Never  Vaping Use   Vaping Use: Never used  Substance Use Topics   Alcohol use: Yes    Alcohol/week: 0.0 standard drinks    Comment: social    Drug use: No     Allergies   Patient has no known allergies.   Review of Systems Review of Systems  HENT: Negative.    Respiratory: Negative.    Cardiovascular: Negative.   Musculoskeletal:  Positive for back pain. Negative for arthralgias, gait problem, joint swelling, myalgias, neck pain and neck stiffness.  Skin: Negative.   Neurological: Negative.     Physical  Exam Triage Vital Signs ED Triage Vitals  Enc Vitals Group     BP 04/13/21 1211 105/73     Pulse Rate 04/13/21 1211 80     Resp 04/13/21 1211 18     Temp 04/13/21 1211 98.3 F (36.8 C)     Temp Source 04/13/21 1211 Oral     SpO2 04/13/21 1211 95 %     Weight 04/13/21 1210 147 lb 11.3 oz (67 kg)     Height 04/13/21 1210 5\' 5"  (1.651 m)     Head Circumference --      Peak Flow --      Pain Score --      Pain Loc --      Pain Edu? --      Excl. in [place]? --    No data found.  Updated Vital Signs BP 105/73 (BP Location: Left Arm)    Pulse 80    Temp 98.3 F (36.8 C) (Oral)    Resp 18    Ht 5\' 5"  (1.651 m)    Wt 147 lb 11.3 oz (67 kg)    SpO2 95%    BMI 24.58 kg/m   Visual Acuity Right Eye Distance:   Left Eye Distance:   Bilateral Distance:    Right Eye Near:   Left Eye Near:    Bilateral Near:     Physical Exam Constitutional:      Appearance: Normal appearance.  HENT:     Head: Normocephalic.  Eyes:     Extraocular Movements: Extraocular movements intact.  Pulmonary:     Effort: Pulmonary effort is normal.  Musculoskeletal:     Cervical back: Normal.     Thoracic back: Normal.     Comments: Tenderness across the lower latissimus dorsi, no deformity, ecchymosis, crepitus, swelling noted, range of motion intact  Neurological:     Mental Status: He is alert and oriented to person, place, and time. Mental status is at baseline.  Psychiatric:        Mood and Affect: Mood normal.        Behavior: Behavior normal.     UC Treatments / Results  Labs (all labs ordered are listed, but only abnormal results are displayed) Labs Reviewed - No data to display  EKG   Radiology No results found.  Procedures Procedures (including critical care time)  Medications Ordered in UC Medications - No data to display  Initial Impression / Assessment and Plan / UC Course  I have reviewed the triage vital signs and the nursing notes.  Pertinent labs & imaging results that  were available during my care of the patient were reviewed by me and considered in my medical decision making (see chart for details).  Chronic bilateral low back pain without sciatica  Patient has history of several sources of back pain and  this is a chronic issue currently being managed by his PCP, patient is on a pain contract, discussed and reminded patient of this contract and that no narcotics would be given at this visit, Toradol injection given in office for pain management, meloxicam and Flexeril prescribed for outpatient use, recommended RICE, heat, daily stretching, activity as tolerated, may follow-up with primary care doctor for further management Final Clinical Impressions(s) / UC Diagnoses   Final diagnoses:  None   Discharge Instructions   None    ED Prescriptions   None    PDMP not reviewed this encounter.   Hans Eden, NP 04/13/21 1310

## 2021-05-20 ENCOUNTER — Ambulatory Visit: Payer: Medicaid Other | Attending: Internal Medicine | Admitting: Internal Medicine

## 2021-05-20 ENCOUNTER — Encounter: Payer: Self-pay | Admitting: Internal Medicine

## 2021-05-20 VITALS — BP 125/81 | HR 84 | Resp 16 | Wt 153.2 lb

## 2021-05-20 DIAGNOSIS — R739 Hyperglycemia, unspecified: Secondary | ICD-10-CM | POA: Diagnosis not present

## 2021-05-20 DIAGNOSIS — M545 Low back pain, unspecified: Secondary | ICD-10-CM

## 2021-05-20 DIAGNOSIS — K5909 Other constipation: Secondary | ICD-10-CM

## 2021-05-20 DIAGNOSIS — R918 Other nonspecific abnormal finding of lung field: Secondary | ICD-10-CM | POA: Diagnosis not present

## 2021-05-20 DIAGNOSIS — H5203 Hypermetropia, bilateral: Secondary | ICD-10-CM | POA: Diagnosis not present

## 2021-05-20 DIAGNOSIS — I700 Atherosclerosis of aorta: Secondary | ICD-10-CM | POA: Diagnosis not present

## 2021-05-20 DIAGNOSIS — Z0000 Encounter for general adult medical examination without abnormal findings: Secondary | ICD-10-CM | POA: Diagnosis not present

## 2021-05-20 DIAGNOSIS — E785 Hyperlipidemia, unspecified: Secondary | ICD-10-CM | POA: Diagnosis not present

## 2021-05-20 DIAGNOSIS — Z23 Encounter for immunization: Secondary | ICD-10-CM | POA: Diagnosis not present

## 2021-05-20 DIAGNOSIS — J432 Centrilobular emphysema: Secondary | ICD-10-CM | POA: Diagnosis not present

## 2021-05-20 DIAGNOSIS — G8929 Other chronic pain: Secondary | ICD-10-CM | POA: Diagnosis not present

## 2021-05-20 DIAGNOSIS — Z0001 Encounter for general adult medical examination with abnormal findings: Secondary | ICD-10-CM

## 2021-05-20 MED ORDER — DOCUSATE SODIUM 100 MG PO CAPS: 100.0000 mg | ORAL_CAPSULE | Freq: Every day | ORAL | 3 refills | Status: DC | PRN

## 2021-05-20 MED ORDER — TIOTROPIUM BROMIDE MONOHYDRATE 18 MCG IN CAPS: 18.0000 ug | ORAL_CAPSULE | Freq: Every day | RESPIRATORY_TRACT | 6 refills | Status: DC

## 2021-05-20 MED ORDER — ATORVASTATIN CALCIUM 20 MG PO TABS: ORAL_TABLET | ORAL | 1 refills | Status: DC

## 2021-05-20 MED ORDER — TRAMADOL HCL 50 MG PO TABS: 50.0000 mg | ORAL_TABLET | Freq: Every day | ORAL | 1 refills | Status: DC | PRN

## 2021-05-20 NOTE — Progress Notes (Addendum)
? ? ?Patient ID: Nathaniel Robinson, male    DOB: 06-19-1955  MRN: [id number] ? ?CC: Annual Exam ? ? ?Subjective: ?Nathaniel Robinson is a 66 y.o. male who presents for physical:  ?Pt with hx of HL, chronic LBP, GERD, peripheral neuropathy, former smoker, centrilobular emphysema, lung nodules (due for repeat 03/2021), aortic and coronary Ca+, elev PSA (neg bx 02/2019) ? ?Chronic back pain:  Tramadol RF on last visit.  However, pt states he was dispensed only 5 tabs at that time and has been without it since then.   ?Pain level varies b/w 5-7/10 ?Had inj to back in the past with effects lasting 4-5 mths.  Afraid to get it again because the procedure it self is very painful. ? ?HL:  taking and tolerating Atorvastatin ? ?Tob dep:  he has remained tobacco free ?Due for repeat CT chest for f/u lung nodules.  Incidental finding of aortic atherosclerosis on CT of chest and saw cardiologist Dr. Harrell Gave for this 09/2020.  Since he was asymptomatic, patient was counseled on red flag warnings that would indicate need for medical attention.  He was advised to continue atorvastatin. ?Patient reports that he walks daily for 30 to 40 minutes.  Not able to do more than that because of his back. ? ?Emphysema:  using Spiriva.  Breathing has been stable.  ? ?Patient Active Problem List  ? Diagnosis Date Noted  ? ED (erectile dysfunction) 01/27/2021  ? Controlled substance agreement signed 09/17/2020  ? Lung nodules 05/18/2020  ? Atherosclerosis of native coronary artery of native heart without angina pectoris 05/18/2020  ? Elevated PSA 12/26/2018  ? Flu vaccine need 10/08/2018  ? Spondylosis without myelopathy or radiculopathy, lumbar region 09/14/2018  ? Tobacco dependence 06/01/2018  ? Paresthesia 01/02/2018  ? Gastroesophageal reflux disease without esophagitis 09/15/2017  ? Idiopathic peripheral neuropathy 09/15/2017  ? Chronic lumbar radiculopathy 02/10/2017  ? Hyperlipidemia 03/14/2016  ? Lumbar degenerative disc disease 09/11/2015  ?  Chronic pain syndrome 09/11/2015  ? 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  ? Chronic low back pain   ?  ? ?Current Outpatient Medications on File Prior to Visit  ?Medication Sig Dispense Refill  ? albuterol (PROAIR HFA) 108 (90 Base) MCG/ACT inhaler INHALE 2 PUFFS INTO THE LUNGS EVERY 6 HOURS AS NEEDED FOR WHEEZING OR SHORTNESS OF BREATH (Patient not taking: Reported on 03/25/2021) 8.5 g 2  ? atorvastatin (LIPITOR) 20 MG tablet TAKE 1 TABLET(20 MG) BY MOUTH DAILY 90 tablet 1  ? cyclobenzaprine (FLEXERIL) 10 MG tablet Take 1 tablet (10 mg total) by mouth 2 (two) times daily as needed for muscle spasms. 20 tablet 0  ? meloxicam (MOBIC) 15 MG tablet Take 1 tablet (15 mg total) by mouth daily. 30 tablet 0  ? tiotropium (SPIRIVA) 18 MCG inhalation capsule Place 18 mcg into inhaler and inhale daily.    ? traMADol (ULTRAM) 50 MG tablet Take 1 tablet (50 mg total) by mouth daily as needed. (Patient not taking: Reported on 03/25/2021) 30 tablet 1  ? ?No current facility-administered medications on file prior to visit.  ? ? ?No Known Allergies ? ?Social History  ? ?Socioeconomic History  ? Marital status: Married  ?  Spouse name: Not on file  ? Number of children: 4  ? Years of education: come college  ? Highest education level: Not on file  ?Occupational History  ? Occupation: disability  ?  Comment: [place]   ?Tobacco Use  ? Smoking status: Former  ?  Types: Cigarettes  ?  Quit date: 05/15/2020  ?  Years since quitting: 1.0  ? Smokeless tobacco: Never  ?Vaping Use  ? Vaping Use: Never used  ?Substance and Sexual Activity  ? Alcohol use: Yes  ?  Alcohol/week: 0.0 standard drinks  ?  Comment: social   ? Drug use: No  ? Sexual activity: Never  ?Other Topics Concern  ? Not on file  ?Social History Narrative  ? Form [place]  ? Moved to [place] in 01/2014   ? Lives with roommate.  ? Right-handed.  ? No caffeine use.  ? Wife and 4 children in [place].   ? ?Social Determinants of Health  ? ?Financial Resource  Strain: Not on file  ?Food Insecurity: Not on file  ?Transportation Needs: Not on file  ?Physical Activity: Not on file  ?Stress: Not on file  ?Social Connections: Not on file  ?Intimate Partner Violence: Not on file  ? ? ?Family History  ?Problem Relation Age of Onset  ? Ovarian cancer Mother   ? Other Father   ?     unsure of medical history  ? Heart disease Neg Hx   ? Hypertension Neg Hx   ? Diabetes Neg Hx   ? Colon cancer Neg Hx   ? ? ?Past Surgical History:  ?Procedure Laterality Date  ? COLONOSCOPY N/A 05/15/2015  ? Procedure: COLONOSCOPY;  Surgeon: Danie Binder, MD;  [facility];  Service: Endoscopy;  Laterality: N/A;  145 - moved to 1:30 - office to notify  ? COLONOSCOPY WITH PROPOFOL N/A 03/30/2021  ? Procedure: COLONOSCOPY WITH PROPOFOL;  Surgeon: Eloise Harman, DO;  [facility];  Service: Endoscopy;  Laterality: N/A;  10:00 / ASA 2  ? POLYPECTOMY  03/30/2021  ? Procedure: POLYPECTOMY;  Surgeon: Eloise Harman, DO;  [facility];  Service: Endoscopy;;  ? ? ?ROS: ?Review of Systems ?GI:  no BM yesterday or today.  Usually moves BM once a day.  Had c-scope since last visit.  Due again in 10 yrs ?Eye:  problems reading things close up. Uses OTC readers.  Feels near vision is getting worse.  Request referral for eye exam. ? ?PHYSICAL EXAM: ?BP 125/81   Pulse 84   Resp 16   Wt 153 lb 3.2 oz (69.5 kg)   SpO2 95%   BMI 25.49 kg/m?   ?Physical Exam ? ? ?General appearance - alert, well appearing, older male and in no distress ?Mental status - normal mood, behavior, speech, dress, motor activity, and thought processes ?Eyes - pupils equal and reactive, extraocular eye movements intact.  Pink conjunctiva ?Ears - bilateral TM's and external ear canals normal ?Nose - normal and patent, no erythema, discharge or polyps ?Mouth -he is wearing dentures.  Throat without erythema or exudates. ?Neck - supple, no thyroid enlargement.  No lymphadenopathy.  Again seen is soft  raised movable mass on the right posterior neck line.  This was seen previously and patient reports it has not changed.  Today it measures 2 x 2 cm which is less than on the last visit. ?Lymphatics - no palpable lymphadenopathy, no hepatosplenomegaly ?Chest - clear to auscultation, no wheezes, rales or rhonchi, symmetric air entry ?Heart - normal rate, regular rhythm, normal S1, S2, no murmurs, rubs, clicks or gallops ?Abdomen - soft, nontender, nondistended, no masses or organomegaly ?Musculoskeletal - no joint tenderness, deformity or swelling ?Extremities - peripheral pulses normal, no pedal edema, no clubbing or cyanosis ? ? ?  Latest Ref Rng & Units 04/01/2020  ? 10:32 AM 12/24/2018  ?  4:49 PM 11/12/2018  ? 12:27 PM  ?CMP  ?Glucose 65 - 99 mg/dL 106    107    ?BUN 8 - 27 mg/dL 9    13    ?Creatinine 0.76 - 1.27 mg/dL 0.69    0.71    ?Sodium 134 - 144 mmol/L 141    140    ?Potassium 3.5 - 5.2 mmol/L 4.2    4.2    ?Chloride 96 - 106 mmol/L 102    108    ?CO2 20 - 29 mmol/L 26    23    ?Calcium 8.6 - 10.2 mg/dL 9.5    8.9    ?Total Protein 6.0 - 8.5 g/dL 6.7   6.1     ?Total Bilirubin 0.0 - 1.2 mg/dL 0.4   0.3     ?Alkaline Phos 44 - 121 IU/L 67   54     ?AST 0 - 40 IU/L 24   19     ?ALT 0 - 44 IU/L 26   12     ? ?Lipid Panel  ?   ?Component Value Date/Time  ? CHOL 142 04/01/2020 1032  ? TRIG 137 04/01/2020 1032  ? HDL 56 04/01/2020 1032  ? CHOLHDL 2.5 04/01/2020 1032  ? CHOLHDL 3.5 03/10/2016 1412  ? VLDL 25 03/10/2016 1412  ? [place] 62 04/01/2020 1032  ? ? ?CBC ?   ?Component Value Date/Time  ? WBC 7.6 04/01/2020 1032  ? WBC 5.6 11/12/2018 1227  ? RBC 4.91 04/01/2020 1032  ? RBC 4.77 11/12/2018 1227  ? HGB 15.9 04/01/2020 1032  ? HCT 48.5 04/01/2020 1032  ? PLT 217 04/01/2020 1032  ? MCV 99 (H) 04/01/2020 1032  ? MCH 32.4 04/01/2020 1032  ? MCH 32.9 11/12/2018 1227  ? MCHC 32.8 04/01/2020 1032  ? MCHC 34.2 11/12/2018 1227  ? RDW 12.2 04/01/2020 1032  ? LYMPHSABS 2.9 11/12/2018 1227  ? MONOABS 0.5 11/12/2018 1227   ? EOSABS 0.1 11/12/2018 1227  ? BASOSABS 0.0 11/12/2018 1227  ? ? ?ASSESSMENT AND PLAN: ?1. Annual physical exam ?Encourage patient to continue regular exercise with his daily walks. ?Patient advised to eliminate su

## 2021-05-21 ENCOUNTER — Other Ambulatory Visit: Payer: Self-pay

## 2021-05-21 ENCOUNTER — Telehealth: Payer: Self-pay

## 2021-05-21 LAB — COMPREHENSIVE METABOLIC PANEL
ALT: 17 IU/L (ref 0–44)
AST: 20 IU/L (ref 0–40)
Albumin/Globulin Ratio: 1.6 (ref 1.2–2.2)
Albumin: 4.2 g/dL (ref 3.8–4.8)
Alkaline Phosphatase: 68 IU/L (ref 44–121)
BUN/Creatinine Ratio: 15 (ref 10–24)
BUN: 11 mg/dL (ref 8–27)
Bilirubin Total: 0.4 mg/dL (ref 0.0–1.2)
CO2: 23 mmol/L (ref 20–29)
Calcium: 9.3 mg/dL (ref 8.6–10.2)
Chloride: 105 mmol/L (ref 96–106)
Creatinine, Ser: 0.74 mg/dL — ABNORMAL LOW (ref 0.76–1.27)
Globulin, Total: 2.7 g/dL (ref 1.5–4.5)
Glucose: 119 mg/dL — ABNORMAL HIGH (ref 70–99)
Potassium: 4.6 mmol/L (ref 3.5–5.2)
Sodium: 144 mmol/L (ref 134–144)
Total Protein: 6.9 g/dL (ref 6.0–8.5)
eGFR: 101 mL/min/{1.73_m2} (ref >59)

## 2021-05-21 LAB — LIPID PANEL
Chol/HDL Ratio: 3 ratio (ref 0.0–5.0)
Cholesterol, Total: 136 mg/dL (ref 100–199)
HDL: 45 mg/dL (ref >39)
LDL Chol Calc (NIH): 57 mg/dL (ref 0–99)
Triglycerides: 207 mg/dL — ABNORMAL HIGH (ref 0–149)
VLDL Cholesterol Cal: 34 mg/dL (ref 5–40)

## 2021-05-21 LAB — CBC
Hematocrit: 50.4 % (ref 37.5–51.0)
Hemoglobin: 16.9 g/dL (ref 13.0–17.7)
MCH: 32 pg (ref 26.6–33.0)
MCHC: 33.5 g/dL (ref 31.5–35.7)
MCV: 96 fL (ref 79–97)
Platelets: 226 10*3/uL (ref 150–450)
RBC: 5.28 x10E6/uL (ref 4.14–5.80)
RDW: 12.8 % (ref 11.6–15.4)
WBC: 6.7 10*3/uL (ref 3.4–10.8)

## 2021-05-21 NOTE — Addendum Note (Signed)
Addended by: Karle Plumber B on: 05/21/2021 10:09 AM ? ? Modules accepted: Orders ? ?

## 2021-05-21 NOTE — Progress Notes (Signed)
Kidney and liver function tests are normal.  Overall cholesterol levels are good except for mild elevation in triglyceride levels.  Healthy eating habits and regular exercise will help to lower triglycerides.  Blood cell counts are normal.  Blood sugar level mildly elevated.  We will do additional test to screen for diabetes.

## 2021-05-21 NOTE — Telephone Encounter (Signed)
PA for Tramadol has been approved until 11/20/2021 ?

## 2021-05-24 NOTE — Addendum Note (Signed)
Addended by: Jackelyn Knife on: 05/24/2021 09:21 AM ? ? Modules accepted: Orders ? ?

## 2021-05-25 ENCOUNTER — Telehealth: Payer: Self-pay | Admitting: Internal Medicine

## 2021-05-25 NOTE — Addendum Note (Signed)
Addended by: Karle Plumber B on: 05/25/2021 05:55 PM ? ? Modules accepted: Orders ? ?

## 2021-05-25 NOTE — Telephone Encounter (Signed)
Noted.  CT changed to non-contrast CT chest ?

## 2021-05-25 NOTE — Telephone Encounter (Signed)
Copied from [place] [phone]. Topic: General - Other ?>> May 24, 2021  2:52 PM Erick Blinks wrote: ?Reason for CRM: Mariam called from [facility]. She says his orders need to be changed to [place] since the patient has never had a lung cancer screening. Please advise  ?Best contact: [phone] option 1 then option 4 ?

## 2021-05-25 NOTE — Telephone Encounter (Signed)
Will forward to provider  

## 2021-05-26 ENCOUNTER — Telehealth: Payer: Self-pay

## 2021-05-26 LAB — HEMOGLOBIN A1C
Est. average glucose Bld gHb Est-mCnc: 128 mg/dL
Hgb A1c MFr Bld: 6.1 % — ABNORMAL HIGH (ref 4.8–5.6)

## 2021-05-26 LAB — SPECIMEN STATUS REPORT

## 2021-05-26 NOTE — Telephone Encounter (Signed)
Amn Language: [place] ?Id: [id number] ? ?Contacted pt to go over lab results pt is aware and doesn't have any questions or concerns  ?

## 2021-06-14 ENCOUNTER — Ambulatory Visit
Admission: RE | Admit: 2021-06-14 | Discharge: 2021-06-14 | Disposition: A | Discharge status: Home / Self Care | Payer: Medicaid Other | Source: Ambulatory Visit | Attending: Internal Medicine | Admitting: Internal Medicine

## 2021-06-14 DIAGNOSIS — R911 Solitary pulmonary nodule: Secondary | ICD-10-CM | POA: Diagnosis not present

## 2021-06-14 DIAGNOSIS — R918 Other nonspecific abnormal finding of lung field: Secondary | ICD-10-CM | POA: Diagnosis not present

## 2021-06-14 DIAGNOSIS — J439 Emphysema, unspecified: Secondary | ICD-10-CM | POA: Diagnosis not present

## 2021-07-20 DIAGNOSIS — R972 Elevated prostate specific antigen [PSA]: Secondary | ICD-10-CM | POA: Diagnosis not present

## 2021-07-20 DIAGNOSIS — N5201 Erectile dysfunction due to arterial insufficiency: Secondary | ICD-10-CM | POA: Diagnosis not present

## 2021-07-26 ENCOUNTER — Encounter: Payer: Self-pay | Admitting: Internal Medicine

## 2021-07-26 ENCOUNTER — Other Ambulatory Visit: Payer: Self-pay | Admitting: Internal Medicine

## 2021-07-26 DIAGNOSIS — M545 Low back pain, unspecified: Secondary | ICD-10-CM

## 2021-07-26 NOTE — Progress Notes (Signed)
Patient seen by [facility] Dr. Rexene Alberts on 07/20/2021. PSA was 9.16 up from 7.07 in October of last year. Reviewed with patient his increase in PSA.  On digital rectal exam nodule was palpated in the left prostate.  Reviewed MRI findings showing a category 1 lesion with very low likelihood of clinically significant prostate cancer. He recommended repeat TRUS biopsy given evidence of nodule on exam today with elevated PSA he declines at this time.  Patient preferred to follow-up in 6 months with repeat PSA.  Patient understood that he may be risking missing a high-grade prostate cancer. ED: Dispense sildenafil 100 mg.

## 2021-09-14 DIAGNOSIS — H11042 Peripheral pterygium, stationary, left eye: Secondary | ICD-10-CM | POA: Diagnosis not present

## 2021-09-14 DIAGNOSIS — H2513 Age-related nuclear cataract, bilateral: Secondary | ICD-10-CM | POA: Diagnosis not present

## 2021-09-14 DIAGNOSIS — H179 Unspecified corneal scar and opacity: Secondary | ICD-10-CM | POA: Diagnosis not present

## 2021-09-14 DIAGNOSIS — Q142 Congenital malformation of optic disc: Secondary | ICD-10-CM | POA: Diagnosis not present

## 2021-09-14 DIAGNOSIS — H43812 Vitreous degeneration, left eye: Secondary | ICD-10-CM | POA: Diagnosis not present

## 2021-09-14 DIAGNOSIS — H5213 Myopia, bilateral: Secondary | ICD-10-CM | POA: Diagnosis not present

## 2021-09-21 ENCOUNTER — Encounter: Payer: Self-pay | Admitting: Internal Medicine

## 2021-09-21 ENCOUNTER — Ambulatory Visit: Payer: Medicaid Other | Attending: Internal Medicine | Admitting: Internal Medicine

## 2021-09-21 ENCOUNTER — Ambulatory Visit
Admission: RE | Admit: 2021-09-21 | Discharge: 2021-09-21 | Disposition: A | Discharge status: Home / Self Care | Payer: Medicaid Other | Source: Ambulatory Visit | Attending: Internal Medicine | Admitting: Internal Medicine

## 2021-09-21 VITALS — BP 124/80 | HR 84 | Temp 98.4°F | Ht 65.0 in | Wt 156.8 lb

## 2021-09-21 DIAGNOSIS — R7303 Prediabetes: Secondary | ICD-10-CM

## 2021-09-21 DIAGNOSIS — M25511 Pain in right shoulder: Secondary | ICD-10-CM | POA: Diagnosis not present

## 2021-09-21 DIAGNOSIS — J432 Centrilobular emphysema: Secondary | ICD-10-CM | POA: Diagnosis not present

## 2021-09-21 DIAGNOSIS — E785 Hyperlipidemia, unspecified: Secondary | ICD-10-CM | POA: Diagnosis not present

## 2021-09-21 DIAGNOSIS — G8929 Other chronic pain: Secondary | ICD-10-CM

## 2021-09-21 DIAGNOSIS — R972 Elevated prostate specific antigen [PSA]: Secondary | ICD-10-CM

## 2021-09-21 DIAGNOSIS — Z79899 Other long term (current) drug therapy: Secondary | ICD-10-CM

## 2021-09-21 DIAGNOSIS — M545 Low back pain, unspecified: Secondary | ICD-10-CM | POA: Diagnosis not present

## 2021-09-21 MED ORDER — MELOXICAM 15 MG PO TABS: 15.0000 mg | ORAL_TABLET | Freq: Every day | ORAL | 1 refills | Status: DC

## 2021-09-21 NOTE — Progress Notes (Signed)
Patient ID: Nathaniel Robinson, male    DOB: 1956/01/01  MRN: [id number]  CC: Hyperlipidemia   Subjective: Nathaniel Robinson is a 66 y.o. male who presents for chronic ds management His concerns today include:  Pt with hx of HL, chronic LBP, GERD, peripheral neuropathy, former smoker, centrilobular emphysema, lung nodules (due for repeat 03/2021), aortic and coronary Ca+, elev PSA (neg bx 02/2019)  C/o pain RT upper arm muscles with movements x 3 mths No numbness/tingling No pain in the neck No injury or initiating factors Worse with lifting arm Better with limiting movement. Rates pain as 5/10  Chronic LBP:  takes Tramadol every a.m with good results.  No drowsiness or constipation with the medication  [facility] Dr. Rexene Alberts on 07/20/2021. PSA was 9.16 up from 7.07 in October of last year. Nodule palpated in the left prostate on DRE.  MRI findings showing a category 1 lesion with very low likelihood of clinically significant prostate cancer. He recommended repeat TRUS biopsy given evidence of nodule on exam with elevated PSA.  Patient preferred to follow-up in 6 months with repeat PSA.    HL:  still taking and tolerating Lipitor 20 mg daily.  Last LDL was 57.  COPD:  doing well on Spiriva once a day.  He has not had to use Albuterol.  No recent flair  Diagnosed with prediabetes based on labs from last visit with A1C 6.1.  Walks for 20-30  mins daily. Tries to eat healthy.    Patient Active Problem List   Diagnosis Date Noted   Aortic atherosclerosis ([place]) 05/20/2021   ED (erectile dysfunction) 01/27/2021   Controlled substance agreement signed 09/17/2020   Lung nodules 05/18/2020   Atherosclerosis of native coronary artery of native heart without angina pectoris 05/18/2020   Elevated PSA 12/26/2018   Flu vaccine need 10/08/2018   Spondylosis without myelopathy or radiculopathy, lumbar region 09/14/2018   Tobacco dependence 06/01/2018   Paresthesia  01/02/2018   Gastroesophageal reflux disease without esophagitis 09/15/2017   Idiopathic peripheral neuropathy 09/15/2017   Chronic lumbar radiculopathy 02/10/2017   Hyperlipidemia 03/14/2016   Lumbar degenerative disc disease 09/11/2015   Chronic pain syndrome 09/11/2015   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   Chronic low back pain      Current Outpatient Medications on File Prior to Visit  Medication Sig Dispense Refill   atorvastatin (LIPITOR) 20 MG tablet TAKE 1 TABLET(20 MG) BY MOUTH DAILY 90 tablet 1   tiotropium (SPIRIVA) 18 MCG inhalation capsule Place 1 capsule (18 mcg total) into inhaler and inhale daily. 30 capsule 6   traMADol (ULTRAM) 50 MG tablet TAKE 1 TABLET(50 MG) BY MOUTH DAILY AS NEEDED 30 tablet 2   albuterol (PROAIR HFA) 108 (90 Base) MCG/ACT inhaler INHALE 2 PUFFS INTO THE LUNGS EVERY 6 HOURS AS NEEDED FOR WHEEZING OR SHORTNESS OF BREATH (Patient not taking: Reported on 09/21/2021) 8.5 g 2   No current facility-administered medications on file prior to visit.    No Known Allergies  Social History   Socioeconomic History   Marital status: Married    Spouse name: Not on file   Number of children: 4   Years of education: come college   Highest education level: Not on file  Occupational History   Occupation: disability    Comment: [place]   Tobacco Use   Smoking status: Former    Types: Cigarettes    Quit date: 05/15/2020  Years since quitting: 1.3   Smokeless tobacco: Never  Vaping Use   Vaping Use: Never used  Substance and Sexual Activity   Alcohol use: Yes    Alcohol/week: 0.0 standard drinks of alcohol    Comment: social    Drug use: No   Sexual activity: Never  Other Topics Concern   Not on file  Social History Narrative   Form [place]   Moved to [place] in 01/2014    Lives with roommate.   Right-handed.   No caffeine use.   Wife and 4 children in [place].    Social Determinants of Health   Financial  Resource Strain: Not on file  Food Insecurity: Not on file  Transportation Needs: Not on file  Physical Activity: Not on file  Stress: Not on file  Social Connections: Not on file  Intimate Partner Violence: Not on file    Family History  Problem Relation Age of Onset   Ovarian cancer Mother    Other Father        unsure of medical history   Heart disease Neg Hx    Hypertension Neg Hx    Diabetes Neg Hx    Colon cancer Neg Hx     Past Surgical History:  Procedure Laterality Date   COLONOSCOPY N/A 05/15/2015   Procedure: COLONOSCOPY;  Surgeon: Danie Binder, MD;  [facility];  Service: Endoscopy;  Laterality: N/A;  145 - moved to 1:30 - office to notify   COLONOSCOPY WITH PROPOFOL N/A 03/30/2021   Procedure: COLONOSCOPY WITH PROPOFOL;  Surgeon: Eloise Harman, DO;  [facility];  Service: Endoscopy;  Laterality: N/A;  10:00 / ASA 2   POLYPECTOMY  03/30/2021   Procedure: POLYPECTOMY;  Surgeon: Eloise Harman, DO;  [facility];  Service: Endoscopy;;    ROS: Review of Systems Negative except as stated above  PHYSICAL EXAM: BP 124/80   Pulse 84   Temp 98.4 F (36.9 C) (Oral)   Ht '5\' 5"'$  (1.651 m)   Wt 156 lb 12.8 oz (71.1 kg)   SpO2 96%   BMI 26.09 kg/m   Physical Exam  General appearance - alert, well appearing, and in no distress Mental status - normal mood, behavior, speech, dress, motor activity, and thought processes Neck - supple, no significant adenopathy Chest - clear to auscultation, no wheezes, rales or rhonchi, symmetric air entry Heart - normal rate, regular rhythm, normal S1, S2, no murmurs, rubs, clicks or gallops Musculoskeletal -right shoulder: No tenderness on palpation.  He has moderate discomfort with passive range of motion of the joint in all directions.  Drop arm test negative but it appears that it is uncomfortable for him. Extremities - peripheral pulses normal, no pedal edema, no clubbing or cyanosis      Latest Ref Rng & Units 05/20/2021   12:16 PM 04/01/2020   10:32 AM 12/24/2018    4:49 PM  CMP  Glucose 70 - 99 mg/dL 119  106    BUN 8 - 27 mg/dL 11  9    Creatinine 0.76 - 1.27 mg/dL 0.74  0.69    Sodium 134 - 144 mmol/L 144  141    Potassium 3.5 - 5.2 mmol/L 4.6  4.2    Chloride 96 - 106 mmol/L 105  102    CO2 20 - 29 mmol/L 23  26    Calcium 8.6 - 10.2 mg/dL 9.3  9.5    Total Protein 6.0 - 8.5  g/dL 6.9  6.7  6.1   Total Bilirubin 0.0 - 1.2 mg/dL 0.4  0.4  0.3   Alkaline Phos 44 - 121 IU/L 68  67  54   AST 0 - 40 IU/L '20  24  19   '$ ALT 0 - 44 IU/L '17  26  12    '$ Lipid Panel     Component Value Date/Time   CHOL 136 05/20/2021 1216   TRIG 207 (H) 05/20/2021 1216   HDL 45 05/20/2021 1216   CHOLHDL 3.0 05/20/2021 1216   CHOLHDL 3.5 03/10/2016 1412   VLDL 25 03/10/2016 1412   LDLCALC 57 05/20/2021 1216    CBC    Component Value Date/Time   WBC 6.7 05/20/2021 1216   WBC 5.6 11/12/2018 1227   RBC 5.28 05/20/2021 1216   RBC 4.77 11/12/2018 1227   HGB 16.9 05/20/2021 1216   HCT 50.4 05/20/2021 1216   PLT 226 05/20/2021 1216   MCV 96 05/20/2021 1216   MCH 32.0 05/20/2021 1216   MCH 32.9 11/12/2018 1227   MCHC 33.5 05/20/2021 1216   MCHC 34.2 11/12/2018 1227   RDW 12.8 05/20/2021 1216   LYMPHSABS 2.9 11/12/2018 1227   MONOABS 0.5 11/12/2018 1227   EOSABS 0.1 11/12/2018 1227   BASOSABS 0.0 11/12/2018 1227    ASSESSMENT AND PLAN: 1. 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 - DG Shoulder Right; Future - meloxicam (MOBIC) 15 MG tablet; Take 1 tablet (15 mg total) by mouth daily.  Dispense: 30 tablet; Refill: 1  2. Hyperlipidemia, unspecified hyperlipidemia type Continue atorvastatin 20 mg daily.  3. Centrilobular emphysema (HCC) Stable on Spiriva.  4. Chronic left-sided low back pain without sciatica 5. Controlled substance agreement signed Doing well on tramadol with improved function and no significant adverse effects.  No  aberrant behaviors. Due for updated controlled substance prescribing agreement.  The agreement is in English so I went over and read it to him line by line and got his agreement on each line.  We then had him sign the agreement. - [id number] 11+Oxyco+Alc+Crt-Bund  6. Elevated PSA Followed by urology.  7. Prediabetes Advised to continue regular exercise. Patient advised to eliminate sugary drinks from the diet, cut back on portion sizes especially of white carbohydrates, eat more white lean meat like chicken [place] and seafood instead of beef or pork and incorporate fresh fruits and vegetables into the diet daily.    [employer] Language interpreter used during this encounter. #[id number] Asme[place]  Patient was given the opportunity to ask questions.  Patient verbalized understanding of the plan and was able to repeat key elements of the plan.   This documentation was completed using Radio producer.  Any transcriptional errors are unintentional.  Orders Placed This Encounter  Procedures   DG Shoulder Right   [id number] 11+Oxyco+Alc+Crt-Bund     Requested Prescriptions   Signed Prescriptions Disp Refills   meloxicam (MOBIC) 15 MG tablet 30 tablet 1    Sig: Take 1 tablet (15 mg total) by mouth daily.    Return in about 4 months (around 01/21/2022).  Karle Plumber, MD, FACP

## 2021-09-22 ENCOUNTER — Other Ambulatory Visit: Payer: Self-pay | Admitting: Internal Medicine

## 2021-09-22 DIAGNOSIS — G8929 Other chronic pain: Secondary | ICD-10-CM

## 2021-09-26 LAB — DRUG SCREEN 764883 11+OXYCO+ALC+CRT-BUND
Amphetamines, Urine: NEGATIVE ng/mL
BENZODIAZ UR QL: NEGATIVE ng/mL
Barbiturate: NEGATIVE ng/mL
Cannabinoid Quant, Ur: NEGATIVE ng/mL
Cocaine (Metabolite): NEGATIVE ng/mL
Creatinine: 81.3 mg/dL (ref 20.0–300.0)
Ethanol: NEGATIVE %
Meperidine: NEGATIVE ng/mL
Methadone Screen, Urine: NEGATIVE ng/mL
OPIATE SCREEN URINE: NEGATIVE ng/mL
Oxycodone/Oxymorphone, Urine: NEGATIVE ng/mL
Phencyclidine: NEGATIVE ng/mL
Propoxyphene: NEGATIVE ng/mL
pH, Urine: 5.3 (ref 4.5–8.9)

## 2021-09-26 LAB — TRAMADOL GC/MS, URINE
Tramadol gc/ms Conf: >10000 ng/mL
Tramadol: POSITIVE — AB

## 2021-09-29 ENCOUNTER — Ambulatory Visit (INDEPENDENT_AMBULATORY_CARE_PROVIDER_SITE_OTHER): Payer: Medicaid Other | Admitting: Physician Assistant

## 2021-09-29 DIAGNOSIS — G8929 Other chronic pain: Secondary | ICD-10-CM | POA: Diagnosis not present

## 2021-09-29 DIAGNOSIS — M25511 Pain in right shoulder: Secondary | ICD-10-CM

## 2021-09-29 MED ORDER — METHYLPREDNISOLONE ACETATE 40 MG/ML IJ SUSP
40.0000 mg | INTRAMUSCULAR | Status: AC | PRN
  Administered 2021-09-29: 40 mg via INTRA_ARTICULAR

## 2021-09-29 MED ORDER — LIDOCAINE HCL 2 % IJ SOLN
2.0000 mL | INTRAMUSCULAR | Status: AC | PRN
  Administered 2021-09-29: 2 mL

## 2021-09-29 MED ORDER — BUPIVACAINE HCL 0.25 % IJ SOLN
2.0000 mL | INTRAMUSCULAR | Status: AC | PRN
  Administered 2021-09-29: 2 mL via INTRA_ARTICULAR

## 2021-09-29 NOTE — Progress Notes (Signed)
Office Visit Note   Patient: Nathaniel Robinson           Date of Birth: July 19, 1955           MRN: [id number] Visit Date: 09/29/2021              Requested by: Ladell Pier, MD [address] [place],  [place] [id number] PCP: Ladell Pier, MD   Assessment & Plan: Visit Diagnoses:  1. Chronic right shoulder pain     Plan: Impression is right shoulder pain likely from subacromial bursitis/rotator cuff tendinitis.  We discussed proceeding with subacromial cortisone injection today for which she is agreeable to.  If his symptoms do not improve over the next month or so he will call and let us know.  Call with concerns or questions in the meantime.  Follow-Up Instructions: Return if symptoms worsen or fail to improve.   Orders:  Orders Placed This Encounter  Procedures   Large Joint Inj   No orders of the defined types were placed in this encounter.     Procedures: Large Joint Inj: R subacromial bursa on 09/29/2021 10:34 AM Indications: pain Details: 22 G needle Medications: 2 mL lidocaine 2 %; 2 mL bupivacaine 0.25 %; 40 mg methylPREDNISolone acetate 40 MG/ML Outcome: tolerated well, no immediate complications Patient was prepped and draped in the usual sterile fashion.       Clinical Data: No additional findings.   Subjective: Chief Complaint  Patient presents with   Right Shoulder - Pain    HPI patient is a pleasant 66 year old gentleman here today with a [place] and interpreter.  He is here with chronic right shoulder pain for the past 4 months.  He denies any injury or change in activity but notes his symptoms have progressively worsened.  The symptoms are primarily to the deltoid and really only occur with forward flexion, abduction, external rotation as well as sleeping on the right side.  He has been taking meloxicam and tramadol without significant relief.  No previous cortisone injection to the right shoulder.  Review of Systems as detailed  in HPI.  All others reviewed and are negative.   Objective: Vital Signs: There were no vitals taken for this visit.  Physical Exam well-developed well-nourished gentleman in no acute distress.  Alert and oriented x3.  Ortho Exam right shoulder exam forward flexion to approximately 170 degrees.  Internal X rotation to 45 degrees, abduction to approximately 120 degrees.  He has a positive empty can test.  Pain with cross body abduction.  5 out of 5 strength throughout.  Neurovascular tact distally.  Specialty Comments:  No specialty comments available.  Imaging: X-rays of the right shoulder reviewed by me in canopy show mild to moderate degenerative changes of the before meals and glenohumeral joints   PMFS History: Patient Active Problem List   Diagnosis Date Noted   Aortic atherosclerosis ([place]) 05/20/2021   ED (erectile dysfunction) 01/27/2021   Controlled substance agreement signed 09/17/2020   Lung nodules 05/18/2020   Atherosclerosis of native coronary artery of native heart without angina pectoris 05/18/2020   Elevated PSA 12/26/2018   Flu vaccine need 10/08/2018   Spondylosis without myelopathy or radiculopathy, lumbar region 09/14/2018   Tobacco dependence 06/01/2018   Paresthesia 01/02/2018   Gastroesophageal reflux disease without esophagitis 09/15/2017   Idiopathic peripheral neuropathy 09/15/2017   Chronic lumbar radiculopathy 02/10/2017   Hyperlipidemia 03/14/2016   Lumbar degenerative disc disease 09/11/2015   Chronic pain syndrome  09/11/2015   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   Chronic low back pain    Past Medical History:  Diagnosis Date   Chronic low back pain 2010    GERD (gastroesophageal reflux disease)     Family History  Problem Relation Age of Onset   Ovarian cancer Mother    Other Father        unsure of medical history   Heart disease Neg Hx    Hypertension Neg Hx    Diabetes Neg Hx    Colon cancer Neg Hx      Past Surgical History:  Procedure Laterality Date   COLONOSCOPY N/A 05/15/2015   Procedure: COLONOSCOPY;  Surgeon: Danie Binder, MD;  [facility];  Service: Endoscopy;  Laterality: N/A;  145 - moved to 1:30 - office to notify   COLONOSCOPY WITH PROPOFOL N/A 03/30/2021   Procedure: COLONOSCOPY WITH PROPOFOL;  Surgeon: Eloise Harman, DO;  [facility];  Service: Endoscopy;  Laterality: N/A;  10:00 / ASA 2   POLYPECTOMY  03/30/2021   Procedure: POLYPECTOMY;  Surgeon: Eloise Harman, DO;  [facility];  Service: Endoscopy;;   Social History   Occupational History   Occupation: disability    Comment: [place]   Tobacco Use   Smoking status: Former    Types: Cigarettes    Quit date: 05/15/2020    Years since quitting: 1.3   Smokeless tobacco: Never  Vaping Use   Vaping Use: Never used  Substance and Sexual Activity   Alcohol use: Yes    Alcohol/week: 0.0 standard drinks of alcohol    Comment: social    Drug use: No   Sexual activity: Never

## 2021-09-30 DIAGNOSIS — H524 Presbyopia: Secondary | ICD-10-CM | POA: Diagnosis not present

## 2021-10-25 ENCOUNTER — Other Ambulatory Visit: Payer: Self-pay | Admitting: Internal Medicine

## 2021-10-25 DIAGNOSIS — M545 Low back pain, unspecified: Secondary | ICD-10-CM

## 2021-11-18 ENCOUNTER — Other Ambulatory Visit: Payer: Self-pay | Admitting: Internal Medicine

## 2021-11-18 DIAGNOSIS — E785 Hyperlipidemia, unspecified: Secondary | ICD-10-CM

## 2021-11-18 NOTE — Telephone Encounter (Signed)
Requested Prescriptions  Pending Prescriptions Disp Refills  . atorvastatin (LIPITOR) 20 MG tablet [Pharmacy Med Name: ATORVASTATIN '20MG'$  TABLETS] 90 tablet 1    Sig: TAKE 1 TABLET(20 MG) BY MOUTH DAILY     Cardiovascular:  Antilipid - Statins Failed - 11/18/2021  1:22 PM      Failed - Lipid Panel in normal range within the last 12 months    Cholesterol, Total  Date Value Ref Range Status  05/20/2021 136 100 - 199 mg/dL Final   LDL Chol Calc (NIH)  Date Value Ref Range Status  05/20/2021 57 0 - 99 mg/dL Final   HDL  Date Value Ref Range Status  05/20/2021 45 >39 mg/dL Final   Triglycerides  Date Value Ref Range Status  05/20/2021 207 (H) 0 - 149 mg/dL Final         Passed - Patient is not pregnant      Passed - Valid encounter within last 12 months    Recent Outpatient Visits          1 month ago Chronic right shoulder pain   [place], MD   6 months ago Annual physical exam   [place] Ladell Pier, MD   10 months ago Centrilobular emphysema [facility])   [place], Deborah B, MD   1 year ago Centrilobular emphysema [facility])   [place], Deborah B, MD   1 year ago Centrilobular emphysema [facility])   [place], Deborah B, MD      Future Appointments            In 2 months Wynetta Emery Dalbert Batman, MD [place]

## 2021-12-01 ENCOUNTER — Ambulatory Visit (INDEPENDENT_AMBULATORY_CARE_PROVIDER_SITE_OTHER): Payer: Medicaid Other | Admitting: Orthopaedic Surgery

## 2021-12-01 DIAGNOSIS — G8929 Other chronic pain: Secondary | ICD-10-CM | POA: Diagnosis not present

## 2021-12-01 DIAGNOSIS — M25511 Pain in right shoulder: Secondary | ICD-10-CM

## 2021-12-01 NOTE — Progress Notes (Signed)
Office Visit Note   Patient: Nathaniel Robinson           Date of Birth: 05/16/1955           MRN: [id number] Visit Date: 12/01/2021              Requested by: Ladell Pier, MD [address] [place],  [place] [id number] PCP: Ladell Pier, MD   Assessment & Plan: Visit Diagnoses:  1. Chronic right shoulder pain     Plan: Impression is chronic right shoulder pain concerning for rotator cuff pathology.  At this point, the patient has not had longstanding relief from physical therapy or cortisone injection.  I would like to go ahead and get an MRI to assess for structural abnormalities.  We will follow-up with [place] once this is been completed.  Follow-Up Instructions: Return for f/u after mri.   Orders:  Orders Placed This Encounter  Procedures   MR SHOULDER RIGHT WO CONTRAST   No orders of the defined types were placed in this encounter.     Procedures: No procedures performed   Clinical Data: No additional findings.   Subjective: Chief Complaint  Patient presents with   Right Shoulder - Pain    HPI patient is a pleasant 66 year old gentleman who comes in today with recurrent right shoulder pain.  He was seen in our office a few months ago for this where cortisone injection was performed.  He had significant relief but unfortunately as this only lasted for about a month.  He has also been working on a home exercise program since his last visit without relief.  His symptoms have returned.  Pain is worse with lifting his arm as well as bringing his arm across his chest to wash his back.  He has been taking tramadol without relief.  Review of Systems as detailed in HPI.  All others reviewed and are negative.   Objective: Vital Signs: There were no vitals taken for this visit.  Physical Exam well-developed well-nourished gentleman in no acute distress.  Alert and oriented x3.  Ortho Exam unchanged right shoulder exam  Specialty Comments:  No specialty  comments available.  Imaging: No new imaging   PMFS History: Patient Active Problem List   Diagnosis Date Noted   Aortic atherosclerosis ([place]) 05/20/2021   ED (erectile dysfunction) 01/27/2021   Controlled substance agreement signed 09/17/2020   Lung nodules 05/18/2020   Atherosclerosis of native coronary artery of native heart without angina pectoris 05/18/2020   Elevated PSA 12/26/2018   Flu vaccine need 10/08/2018   Spondylosis without myelopathy or radiculopathy, lumbar region 09/14/2018   Tobacco dependence 06/01/2018   Paresthesia 01/02/2018   Gastroesophageal reflux disease without esophagitis 09/15/2017   Idiopathic peripheral neuropathy 09/15/2017   Chronic lumbar radiculopathy 02/10/2017   Hyperlipidemia 03/14/2016   Lumbar degenerative disc disease 09/11/2015   Chronic pain syndrome 09/11/2015   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   Chronic low back pain    Past Medical History:  Diagnosis Date   Chronic low back pain 2010    GERD (gastroesophageal reflux disease)     Family History  Problem Relation Age of Onset   Ovarian cancer Mother    Other Father        unsure of medical history   Heart disease Neg Hx    Hypertension Neg Hx    Diabetes Neg Hx    Colon cancer Neg Hx  Past Surgical History:  Procedure Laterality Date   COLONOSCOPY N/A 05/15/2015   Procedure: COLONOSCOPY;  Surgeon: Danie Binder, MD;  [facility];  Service: Endoscopy;  Laterality: N/A;  145 - moved to 1:30 - office to notify   COLONOSCOPY WITH PROPOFOL N/A 03/30/2021   Procedure: COLONOSCOPY WITH PROPOFOL;  Surgeon: Eloise Harman, DO;  [facility];  Service: Endoscopy;  Laterality: N/A;  10:00 / ASA 2   POLYPECTOMY  03/30/2021   Procedure: POLYPECTOMY;  Surgeon: Eloise Harman, DO;  [facility];  Service: Endoscopy;;   Social History   Occupational History   Occupation: disability    Comment: [place]    Tobacco Use   Smoking status: Former    Types: Cigarettes    Quit date: 05/15/2020    Years since quitting: 1.5   Smokeless tobacco: Never  Vaping Use   Vaping Use: Never used  Substance and Sexual Activity   Alcohol use: Yes    Alcohol/week: 0.0 standard drinks of alcohol    Comment: social    Drug use: No   Sexual activity: Never

## 2021-12-22 ENCOUNTER — Ambulatory Visit
Admission: RE | Admit: 2021-12-22 | Discharge: 2021-12-22 | Disposition: A | Discharge status: Home / Self Care | Payer: Medicaid Other | Source: Ambulatory Visit | Attending: Orthopaedic Surgery | Admitting: Orthopaedic Surgery

## 2021-12-22 DIAGNOSIS — G8929 Other chronic pain: Secondary | ICD-10-CM

## 2021-12-22 DIAGNOSIS — M25511 Pain in right shoulder: Secondary | ICD-10-CM | POA: Diagnosis not present

## 2021-12-24 ENCOUNTER — Ambulatory Visit (INDEPENDENT_AMBULATORY_CARE_PROVIDER_SITE_OTHER): Payer: Medicaid Other | Admitting: Orthopaedic Surgery

## 2021-12-24 ENCOUNTER — Encounter: Payer: Self-pay | Admitting: Orthopaedic Surgery

## 2021-12-24 DIAGNOSIS — M25511 Pain in right shoulder: Secondary | ICD-10-CM | POA: Diagnosis not present

## 2021-12-24 DIAGNOSIS — G8929 Other chronic pain: Secondary | ICD-10-CM | POA: Diagnosis not present

## 2021-12-24 NOTE — Progress Notes (Signed)
Office Visit Note   Patient: Nathaniel Robinson           Date of Birth: October 04, 1955           MRN: [id number] Visit Date: 12/24/2021              Requested by: Ladell Pier, MD [address] [place],  [place] [address] PCP: Ladell Pier, MD   Assessment & Plan: Visit Diagnoses:  1. Chronic right shoulder pain     Plan: MRI findings reviewed with the patient which mainly shows mild tendinosis of the rotator cuff and biceps tendon.  Nothing overtly torn or surgical.  Moderate AC joint arthritis.  Based on these findings treatment options were reviewed.  He has had a subacromial injection already but I recommended a glenohumeral injection based on these recent findings.  I will order 1 to be done with Dr. Rolena Infante.  We will see the patient back as needed.  Follow-Up Instructions: No follow-ups on file.   Orders:  Orders Placed This Encounter  Procedures   AMB referral to sports medicine   No orders of the defined types were placed in this encounter.     Procedures: No procedures performed   Clinical Data: No additional findings.   Subjective: Chief Complaint  Patient presents with   Right Shoulder - Follow-up    MRI review    HPI Patient returns today to discuss right shoulder MRI scan.  Interpreter present today.  He did receive a subacromial injection which gave temporary and partial relief.  Review of Systems  Constitutional: Negative.   All other systems reviewed and are negative.    Objective: Vital Signs: There were no vitals taken for this visit.  Physical Exam Vitals and nursing note reviewed.  Constitutional:      Appearance: He is well-developed.  HENT:     Head: Normocephalic and atraumatic.  Eyes:     Pupils: Pupils are equal, round, and reactive to light.  Pulmonary:     Effort: Pulmonary effort is normal.  Abdominal:     Palpations: Abdomen is soft.  Musculoskeletal:        General: Normal range of motion.     Cervical  back: Neck supple.  Skin:    General: Skin is warm.  Neurological:     Mental Status: He is alert and oriented to person, place, and time.  Psychiatric:        Behavior: Behavior normal.        Thought Content: Thought content normal.        Judgment: Judgment normal.     Ortho Exam Examination of the right shoulder is unchanged. Specialty Comments:  No specialty comments available.  Imaging: No results found.   PMFS History: Patient Active Problem List   Diagnosis Date Noted   Aortic atherosclerosis ([place]) 05/20/2021   ED (erectile dysfunction) 01/27/2021   Controlled substance agreement signed 09/17/2020   Lung nodules 05/18/2020   Atherosclerosis of native coronary artery of native heart without angina pectoris 05/18/2020   Elevated PSA 12/26/2018   Flu vaccine need 10/08/2018   Spondylosis without myelopathy or radiculopathy, lumbar region 09/14/2018   Tobacco dependence 06/01/2018   Paresthesia 01/02/2018   Gastroesophageal reflux disease without esophagitis 09/15/2017   Idiopathic peripheral neuropathy 09/15/2017   Chronic lumbar radiculopathy 02/10/2017   Hyperlipidemia 03/14/2016   Lumbar degenerative disc disease 09/11/2015   Chronic pain syndrome 09/11/2015   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   Positive  TB test 06/16/2014   Chronic low back pain    Past Medical History:  Diagnosis Date   Chronic low back pain 2010    GERD (gastroesophageal reflux disease)     Family History  Problem Relation Age of Onset   Ovarian cancer Mother    Other Father        unsure of medical history   Heart disease Neg Hx    Hypertension Neg Hx    Diabetes Neg Hx    Colon cancer Neg Hx     Past Surgical History:  Procedure Laterality Date   COLONOSCOPY N/A 05/15/2015   Procedure: COLONOSCOPY;  Surgeon: Danie Binder, MD;  [facility];  Service: Endoscopy;  Laterality: N/A;  145 - moved to 1:30 - office to notify   COLONOSCOPY WITH PROPOFOL N/A 03/30/2021    Procedure: COLONOSCOPY WITH PROPOFOL;  Surgeon: Eloise Harman, DO;  [facility];  Service: Endoscopy;  Laterality: N/A;  10:00 / ASA 2   POLYPECTOMY  03/30/2021   Procedure: POLYPECTOMY;  Surgeon: Eloise Harman, DO;  [facility];  Service: Endoscopy;;   Social History   Occupational History   Occupation: disability    Comment: [place]   Tobacco Use   Smoking status: Former    Types: Cigarettes    Quit date: 05/15/2020    Years since quitting: 1.6   Smokeless tobacco: Never  Vaping Use   Vaping Use: Never used  Substance and Sexual Activity   Alcohol use: Yes    Alcohol/week: 0.0 standard drinks of alcohol    Comment: social    Drug use: No   Sexual activity: Never

## 2021-12-29 ENCOUNTER — Encounter: Payer: Self-pay | Admitting: Sports Medicine

## 2021-12-29 ENCOUNTER — Ambulatory Visit: Payer: Self-pay

## 2021-12-29 ENCOUNTER — Ambulatory Visit (INDEPENDENT_AMBULATORY_CARE_PROVIDER_SITE_OTHER): Payer: Medicaid Other | Admitting: Sports Medicine

## 2021-12-29 DIAGNOSIS — M25511 Pain in right shoulder: Secondary | ICD-10-CM

## 2021-12-29 DIAGNOSIS — G8929 Other chronic pain: Secondary | ICD-10-CM | POA: Diagnosis not present

## 2021-12-29 MED ORDER — LIDOCAINE HCL 1 % IJ SOLN
2.0000 mL | INTRAMUSCULAR | Status: AC | PRN
  Administered 2021-12-29: 2 mL

## 2021-12-29 MED ORDER — BUPIVACAINE HCL 0.25 % IJ SOLN
2.0000 mL | INTRAMUSCULAR | Status: AC | PRN
  Administered 2021-12-29: 2 mL via INTRA_ARTICULAR

## 2021-12-29 MED ORDER — METHYLPREDNISOLONE ACETATE 40 MG/ML IJ SUSP
80.0000 mg | INTRAMUSCULAR | Status: AC | PRN
  Administered 2021-12-29: 80 mg via INTRA_ARTICULAR

## 2021-12-29 NOTE — Progress Notes (Signed)
    Procedure Note  Patient: Nathaniel Robinson             Date of Birth: [birth year]           MRN: [id number]             Visit Date: 12/29/2021  The use of an in-person [place] interpreter was used throughout the entirety of the visit today.  Procedures: Visit Diagnoses:  1. Chronic right shoulder pain    Large Joint Inj: R glenohumeral on 12/29/2021 9:33 AM Indications: pain Details: 22 G 3.5 in needle, ultrasound-guided posterior approach Medications: 2 mL lidocaine 1 %; 2 mL bupivacaine 0.25 %; 80 mg methylPREDNISolone acetate 40 MG/ML Outcome: tolerated well, no immediate complications  US-guided glenohumeral joint injection, right shoulder After discussion on risks/benefits/indications, informed verbal consent was obtained. A timeout was then performed. The patient was positioned lying lateral recumbent on examination table. The patient's shoulder was prepped with betadine and multiple alcohol swabs and utilizing ultrasound guidance, the patient's glenohumeral joint was identified on ultrasound. Using ultrasound guidance a 22-gauge, 3.5 inch needle with a mixture of 2:2:2 cc's lidocaine:bupivicaine:depomedrol was directed from a lateral to medial direction via in-plane technique into the glenohumeral joint with visualization of appropriate spread of injectate into the joint. Patient tolerated the procedure well without immediate complications.      Procedure, treatment alternatives, risks and benefits explained, specific risks discussed. Consent was given by the patient. Immediately prior to procedure a time out was called to verify the correct patient, procedure, equipment, support staff and site/side marked as required. Patient was prepped and draped in the usual sterile fashion.     - I evaluated the patient about 10 minutes post-injection and he had improvement in pain and range of motion - follow-up with Dr. Erlinda Hong as indicated; I am happy to see them as needed  Elba Barman,  DO [place]  This note was dictated using Dragon naturally speaking software and may contain errors in syntax, spelling, or content which have not been identified prior to signing this note.

## 2022-01-19 DIAGNOSIS — R972 Elevated prostate specific antigen [PSA]: Secondary | ICD-10-CM | POA: Diagnosis not present

## 2022-01-25 ENCOUNTER — Other Ambulatory Visit: Payer: Self-pay

## 2022-01-25 ENCOUNTER — Ambulatory Visit: Payer: Medicaid Other | Attending: Internal Medicine | Admitting: Internal Medicine

## 2022-01-25 VITALS — BP 135/83 | HR 84 | Temp 98.1°F | Ht 65.0 in | Wt 154.0 lb

## 2022-01-25 DIAGNOSIS — Z2911 Encounter for prophylactic immunotherapy for respiratory syncytial virus (RSV): Secondary | ICD-10-CM | POA: Diagnosis not present

## 2022-01-25 DIAGNOSIS — J432 Centrilobular emphysema: Secondary | ICD-10-CM | POA: Diagnosis not present

## 2022-01-25 DIAGNOSIS — R972 Elevated prostate specific antigen [PSA]: Secondary | ICD-10-CM

## 2022-01-25 DIAGNOSIS — M545 Low back pain, unspecified: Secondary | ICD-10-CM

## 2022-01-25 DIAGNOSIS — G8929 Other chronic pain: Secondary | ICD-10-CM | POA: Diagnosis not present

## 2022-01-25 DIAGNOSIS — E785 Hyperlipidemia, unspecified: Secondary | ICD-10-CM

## 2022-01-25 DIAGNOSIS — Z23 Encounter for immunization: Secondary | ICD-10-CM | POA: Diagnosis not present

## 2022-01-25 DIAGNOSIS — R7303 Prediabetes: Secondary | ICD-10-CM | POA: Diagnosis not present

## 2022-01-25 LAB — POCT GLYCOSYLATED HEMOGLOBIN (HGB A1C): HbA1c, POC (controlled diabetic range): 6 % (ref 0.0–7.0)

## 2022-01-25 LAB — GLUCOSE, POCT (MANUAL RESULT ENTRY): POC Glucose: 90 mg/dl (ref 70–99)

## 2022-01-25 MED ORDER — TIOTROPIUM BROMIDE MONOHYDRATE 18 MCG IN CAPS: 18.0000 ug | ORAL_CAPSULE | Freq: Every day | RESPIRATORY_TRACT | 6 refills | Status: DC

## 2022-01-25 MED ORDER — RSVPREF3 VAC RECOMB ADJUVANTED 120 MCG/0.5ML IM SUSR
0.5000 mL | Freq: Once | INTRAMUSCULAR | 0 refills | Status: AC
  Filled 2022-01-25: qty 0.5, 1d supply, fill #0

## 2022-01-25 NOTE — Progress Notes (Signed)
Patient ID: Nathaniel Robinson, male    DOB: 11-14-1955  MRN: [id number]  CC: Follow-up (Already received flu vax this season)   Subjective: Nathaniel Robinson is a 66 y.o. male who presents for chronic ds management His concerns today include:  Pt with hx of HL, chronic LBP, GERD, peripheral neuropathy, former smoker, centrilobular emphysema, lung nodules (due for repeat 03/2021), aortic and coronary Ca+, elev PSA (neg bx 02/2019), PreDM  RT shoulder pain:  x-ray revealed minimal OA changes in [place] jt.  Saw ortho.  Had MRI that showed some tindinitis: IMPRESSION: 1. Moderate tendinosis of the supraspinatus tendon with a small partial-thickness bursal surface tear anteriorly. 2. Mild tendinosis of the infraspinatus tendon. 3. Mild tendinosis of the subscapularis tendon. 4. Mild tendinosis of the intra-articular portion of the long head of the biceps tendon.  Deem not to need surgery.  Give inj with good results  LBP:  doing well on Tramadol taking it once a day.  Denies any significant side effects from the tramadol.  He is up-to-date with his controlled substance prescribing agreement and urine drug screen.  Elev PSA:  saw urologist 2 wks ago.  Reports PSA was 8.  Has f/u again in 1 mth.  COPD: using Spiriva every day.  Very helpful in decreasing coughing and wheezing  HL:  taking and tolerating Lipitor   PreDM: Results for orders placed or performed in visit on 01/25/22  POCT glucose (manual entry)  Result Value Ref Range   POC Glucose 90 70 - 99 mg/dl  POCT glycosylated hemoglobin (Hb A1C)  Result Value Ref Range   Hemoglobin A1C     HbA1c POC (<> result, manual entry)     HbA1c, POC (prediabetic range)     HbA1c, POC (controlled diabetic range) 6.0 0.0 - 7.0 %  He feels he does okay with his eating habits. He tries to walk daily as much as his back would allow.   HM: Had flu shot at [facility] 1 month ago.  Due for second shingles vaccine which she is agreeable to receiving today.   Due for COVID booster and RSV vaccine. Patient Active Problem List   Diagnosis Date Noted   Aortic atherosclerosis ([place]) 05/20/2021   ED (erectile dysfunction) 01/27/2021   Controlled substance agreement signed 09/17/2020   Lung nodules 05/18/2020   Atherosclerosis of native coronary artery of native heart without angina pectoris 05/18/2020   Elevated PSA 12/26/2018   Flu vaccine need 10/08/2018   Spondylosis without myelopathy or radiculopathy, lumbar region 09/14/2018   Tobacco dependence 06/01/2018   Paresthesia 01/02/2018   Gastroesophageal reflux disease without esophagitis 09/15/2017   Idiopathic peripheral neuropathy 09/15/2017   Chronic lumbar radiculopathy 02/10/2017   Hyperlipidemia 03/14/2016   Lumbar degenerative disc disease 09/11/2015   Chronic pain syndrome 09/11/2015   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   Chronic low back pain      Current Outpatient Medications on File Prior to Visit  Medication Sig Dispense Refill   albuterol (PROAIR HFA) 108 (90 Base) MCG/ACT inhaler INHALE 2 PUFFS INTO THE LUNGS EVERY 6 HOURS AS NEEDED FOR WHEEZING OR SHORTNESS OF BREATH 8.5 g 2   atorvastatin (LIPITOR) 20 MG tablet TAKE 1 TABLET(20 MG) BY MOUTH DAILY 90 tablet 1   tiotropium (SPIRIVA) 18 MCG inhalation capsule Place 1 capsule (18 mcg total) into inhaler and inhale daily. 30 capsule 6   traMADol (ULTRAM) 50 MG tablet Take 1 tablet (50 mg total) by mouth  daily as needed. 30 tablet 2   meloxicam (MOBIC) 15 MG tablet Take 1 tablet (15 mg total) by mouth daily. (Patient not taking: Reported on 01/25/2022) 30 tablet 1   No current facility-administered medications on file prior to visit.    No Known Allergies  Social History   Socioeconomic History   Marital status: Married    Spouse name: Not on file   Number of children: 4   Years of education: come college   Highest education level: Not on file  Occupational History   Occupation:  disability    Comment: [place]   Tobacco Use   Smoking status: Former    Types: Cigarettes    Quit date: 05/15/2020    Years since quitting: 1.6   Smokeless tobacco: Never  Vaping Use   Vaping Use: Never used  Substance and Sexual Activity   Alcohol use: Yes    Alcohol/week: 0.0 standard drinks of alcohol    Comment: social    Drug use: No   Sexual activity: Never  Other Topics Concern   Not on file  Social History Narrative   Form [place]   Moved to [place] in 01/2014    Lives with roommate.   Right-handed.   No caffeine use.   Wife and 4 children in [place].    Social Determinants of Health   Financial Resource Strain: Not on file  Food Insecurity: Not on file  Transportation Needs: Not on file  Physical Activity: Not on file  Stress: Not on file  Social Connections: Not on file  Intimate Partner Violence: Not on file    Family History  Problem Relation Age of Onset   Ovarian cancer Mother    Other Father        unsure of medical history   Heart disease Neg Hx    Hypertension Neg Hx    Diabetes Neg Hx    Colon cancer Neg Hx     Past Surgical History:  Procedure Laterality Date   COLONOSCOPY N/A 05/15/2015   Procedure: COLONOSCOPY;  Surgeon: Danie Binder, MD;  [facility];  Service: Endoscopy;  Laterality: N/A;  145 - moved to 1:30 - office to notify   COLONOSCOPY WITH PROPOFOL N/A 03/30/2021   Procedure: COLONOSCOPY WITH PROPOFOL;  Surgeon: Eloise Harman, DO;  [facility];  Service: Endoscopy;  Laterality: N/A;  10:00 / ASA 2   POLYPECTOMY  03/30/2021   Procedure: POLYPECTOMY;  Surgeon: Eloise Harman, DO;  [facility];  Service: Endoscopy;;    ROS: Review of Systems Negative except as stated above  PHYSICAL EXAM: BP 135/83 (BP Location: Left Arm, Patient Position: Sitting, Cuff Size: Normal)   Pulse 84   Temp 98.1 F (36.7 C) (Oral)   Ht '5\' 5"'$  (1.651 m)   Wt 154 lb (69.9 kg)   SpO2 96%   BMI 25.63 kg/m    Physical Exam   General appearance - alert, well appearing, and in no distress Mental status - normal mood, behavior, speech, dress, motor activity, and thought processes Neck - supple, no significant adenopathy Chest -mild scattered wheezes.  No crackles or rhonchi. Heart - normal rate, regular rhythm, normal S1, S2, no murmurs, rubs, clicks or gallops Extremities - peripheral pulses normal, no pedal edema, no clubbing or cyanosis     Latest Ref Rng & Units 05/20/2021   12:16 PM 04/01/2020   10:32 AM 12/24/2018    4:49 PM  CMP  Glucose  70 - 99 mg/dL 119  106    BUN 8 - 27 mg/dL 11  9    Creatinine 0.76 - 1.27 mg/dL 0.74  0.69    Sodium 134 - 144 mmol/L 144  141    Potassium 3.5 - 5.2 mmol/L 4.6  4.2    Chloride 96 - 106 mmol/L 105  102    CO2 20 - 29 mmol/L 23  26    Calcium 8.6 - 10.2 mg/dL 9.3  9.5    Total Protein 6.0 - 8.5 g/dL 6.9  6.7  6.1   Total Bilirubin 0.0 - 1.2 mg/dL 0.4  0.4  0.3   Alkaline Phos 44 - 121 IU/L 68  67  54   AST 0 - 40 IU/L '20  24  19   '$ ALT 0 - 44 IU/L '17  26  12    '$ Lipid Panel     Component Value Date/Time   CHOL 136 05/20/2021 1216   TRIG 207 (H) 05/20/2021 1216   HDL 45 05/20/2021 1216   CHOLHDL 3.0 05/20/2021 1216   CHOLHDL 3.5 03/10/2016 1412   VLDL 25 03/10/2016 1412   LDLCALC 57 05/20/2021 1216    CBC    Component Value Date/Time   WBC 6.7 05/20/2021 1216   WBC 5.6 11/12/2018 1227   RBC 5.28 05/20/2021 1216   RBC 4.77 11/12/2018 1227   HGB 16.9 05/20/2021 1216   HCT 50.4 05/20/2021 1216   PLT 226 05/20/2021 1216   MCV 96 05/20/2021 1216   MCH 32.0 05/20/2021 1216   MCH 32.9 11/12/2018 1227   MCHC 33.5 05/20/2021 1216   MCHC 34.2 11/12/2018 1227   RDW 12.8 05/20/2021 1216   LYMPHSABS 2.9 11/12/2018 1227   MONOABS 0.5 11/12/2018 1227   EOSABS 0.1 11/12/2018 1227   BASOSABS 0.0 11/12/2018 1227    ASSESSMENT AND PLAN: 1. Centrilobular emphysema (HCC) Stable on Spiriva.  He has remained tobacco free. - tiotropium (SPIRIVA)  18 MCG inhalation capsule; Place 1 capsule (18 mcg total) into inhaler and inhale daily.  Dispense: 30 capsule; Refill: 6  2. Elevated PSA Followed by urology.  3. Chronic left-sided low back pain without sciatica Stable and functional on tramadol once a day.  No aberrant behaviors displayed.  [place] controlled substance reporting system reviewed.  Last refill was done 01/18/2022.  Will send refill with start date of 02/18/2022.  4. Prediabetes Patient advised to eliminate sugary drinks from the diet, cut back on portion sizes especially of white carbohydrates, eat more white lean meat like chicken [place] and seafood instead of beef or pork and incorporate fresh fruits and vegetables into the diet daily.  - POCT glucose (manual entry) - POCT glycosylated hemoglobin (Hb A1C)  5. Hyperlipidemia, unspecified hyperlipidemia type Continue atorvastatin 20 mg daily.  He will be due for lipid profile and LFTs on next visit.  6. Need for shingles vaccine Given today.  7. Need for RSV vaccination Discussed with him what his RSV infection and recommendation for RSV vaccine for patients over the age of 21.  He is agreeable to getting the vaccine.  I have printed a prescription and given it to him to take downstairs to our pharmacy to receive the vaccine. - RSV vaccine recomb adjuvanted (AREXVY) 120 MCG/0.5ML injection; Inject 0.5 mLs into the muscle once for 1 dose.  Dispense: 0.5 mL; Refill: 0    [employer] Language interpreter used during this encounter. #[id number] Quarry  Patient was given the opportunity to ask  questions.  Patient verbalized understanding of the plan and was able to repeat key elements of the plan.   This documentation was completed using Radio producer.  Any transcriptional errors are unintentional.  Orders Placed This Encounter  Procedures   POCT glucose (manual entry)   POCT glycosylated hemoglobin (Hb A1C)     Requested Prescriptions    No  prescriptions requested or ordered in this encounter    No follow-ups on file.  Karle Plumber, MD, FACP

## 2022-02-16 ENCOUNTER — Ambulatory Visit: Payer: Medicaid Other | Admitting: Sports Medicine

## 2022-02-16 ENCOUNTER — Encounter: Payer: Self-pay | Admitting: Sports Medicine

## 2022-02-16 DIAGNOSIS — G8929 Other chronic pain: Secondary | ICD-10-CM

## 2022-02-16 DIAGNOSIS — M25511 Pain in right shoulder: Secondary | ICD-10-CM

## 2022-02-16 DIAGNOSIS — M12811 Other specific arthropathies, not elsewhere classified, right shoulder: Secondary | ICD-10-CM | POA: Diagnosis not present

## 2022-02-16 MED ORDER — CELECOXIB 100 MG PO CAPS: 100.0000 mg | ORAL_CAPSULE | Freq: Two times a day (BID) | ORAL | 1 refills | Status: AC

## 2022-02-16 NOTE — Progress Notes (Signed)
Patient states the injection lasted for about 3-4 weeks and then pain returned Denies any OTC

## 2022-02-16 NOTE — Progress Notes (Signed)
Nathaniel Robinson - 67 y.o. male MRN [id number]  Date of birth: 1955/09/16  Office Visit Note: Visit Date: 02/16/2022 PCP: Ladell Pier, MD Referred by: Ladell Pier, MD  Subjective: Chief Complaint  Patient presents with   Right Shoulder - Follow-up   HPI: Nathaniel Robinson is a pleasant 67 y.o. male who presents today for follow-up of chronic right shoulder pain.  The use of an [place] interpreter was used throughout the entirety of the visit.  Underwent ultrasound-guided glenohumeral joint injection on 12/29/2021.  Patient states he got about 3 weeks of good relief from this, however over the last week or so his pain has returned and has been exacerbated with lying on that side as well as activities such as reaching.  He previously saw Dr. Erlinda Hong and previously had a subacromial joint injection.  He states both injections helped, although the subacromial joint injection he feels like helped a little better and slightly longer.  He has not done any therapy nor taking any medications for this.  Pain will radiate down the lateral arm and deltoid, although no numbness or tingling or radicular symptoms.  Pertinent ROS were reviewed with the patient and found to be negative unless otherwise specified above in HPI.   Assessment & Plan: Visit Diagnoses:  1. Rotator cuff arthropathy of right shoulder   2. Chronic right shoulder pain    *Independent interpretation of right shoulder MRI, right shoulder x-ray *Prescription management  Plan: Discussed with Marquan the etiology of his chronic right shoulder pain.  Believe this is more so due to rotator cuff arthropathy and dysfunction.  He has gotten good relief from shoulder injections in the past, however these wear off after about 1 month.  I do think he would benefit from formalized physical therapy, referral sent today.  Did review his MRI with him as well which showed rotator cuff tendinosis and some fluid within the subacromial  space/bursitis.  Will start him on Celebrex 100 mg twice daily as needed help with pain and calming down inflammation.  I would like to see how he does with 6 weeks of formal therapy in the oral medication, he will then present for reevaluation.  Can always consider repeat subacromial joint injection in the future no sooner than 66-monthintervals.  Follow-up: Return in about 6 weeks (around 03/30/2022) for f/u after starting physical therapy .   Meds & Orders:  Meds ordered this encounter  Medications   celecoxib (CELEBREX) 100 MG capsule    Sig: Take 1 capsule (100 mg total) by mouth 2 (two) times daily.    Dispense:  60 capsule    Refill:  1    Orders Placed This Encounter  Procedures   Ambulatory referral to Physical Therapy     Procedures: No procedures performed      Clinical History: No specialty comments available.  He reports that he quit smoking about 21 months ago. His smoking use included cigarettes. He has never used smokeless tobacco.  Recent Labs    05/20/21 1216 01/25/22 1120  HGBA1C 6.1* 6.0    Objective:    Physical Exam  Gen: Well-appearing, in no acute distress; non-toxic CV: Regular Rate. Well-perfused. Warm.  Resp: Breathing unlabored on room air; no wheezing. Psych: Fluid speech in conversation; appropriate affect; normal thought process Neuro: Sensation intact throughout. No gross coordination deficits.   Ortho Exam - Right shoulder: No significant bony tenderness to palpation, mild TTP in the proximal deltoid region, + Codman's sign.  Full active and passive range of motion in all directions.  There is a positive painful drop arm test, positive empty can and resisted external rotation.  Equivocal speeds testing.  No mechanical blocks to internal or external rotation.  Strength 5/5 throughout, but then secondary to pain with resisted abduction. NVI.  Imaging:  *Independent review and interpretation of MRI of the right shoulder from 12/22/2021 was  interpreted by myself.  MRI demonstrates chronic tendinosis changes of the long head of the biceps, supraspinatus, infraspinatus, and insertional subscapularis tendon.  There is no full-thickness tearing.  There is some edema of the greater tuberosity of the humeral head, indicative of rotator cuff arthropathy versus possible impingement.   MR SHOULDER RIGHT WO CONTRAST CLINICAL DATA:  Right shoulder pain radiating to the upper arm for 3 months.  EXAM: MRI OF THE RIGHT SHOULDER WITHOUT CONTRAST  TECHNIQUE: Multiplanar, multisequence MR imaging of the shoulder was performed. No intravenous contrast was administered.  COMPARISON:  None Available.  FINDINGS: Rotator cuff: Moderate tendinosis of the supraspinatus tendon with a small partial-thickness bursal surface tear anteriorly. Mild tendinosis of the infraspinatus tendon. Teres minor tendon is intact. Mild tendinosis of the subscapularis tendon.  Muscles: No muscle atrophy or edema. No intramuscular fluid collection or hematoma.  Biceps Long Head: Mild tendinosis of the intra-articular portion of the long head of the biceps tendon.  Acromioclavicular Joint: Moderate arthropathy of the acromioclavicular joint. Trace subacromial/subdeltoid bursal fluid.  Glenohumeral Joint: No joint effusion. No chondral defect.  Labrum: Grossly intact, but evaluation is limited by lack of intraarticular fluid/contrast.  Bones: No fracture or dislocation. No aggressive osseous lesion.  Other: No fluid collection or hematoma.  IMPRESSION: 1. Moderate tendinosis of the supraspinatus tendon with a small partial-thickness bursal surface tear anteriorly. 2. Mild tendinosis of the infraspinatus tendon. 3. Mild tendinosis of the subscapularis tendon. 4. Mild tendinosis of the intra-articular portion of the long head of the biceps tendon.  Electronically Signed   By: Kathreen Devoid M.D.   On: 12/24/2021 08:49  *Independent review of three-view  right shoulder x-ray from 09/21/2020 including AP, scapular Y and axillary view were reviewed and independently interpreted by myself.  Mild glenohumeral joint arthritis, moderate AC joint arthropathy.  Otherwise no acute fracture or malalignment.  Narrative & Impression  CLINICAL DATA:  Right shoulder pain with movement.  No injury.   EXAM: RIGHT SHOULDER - 2+ VIEW   COMPARISON:  None Available.   FINDINGS: Minimal inferior glenoid degenerative osteophytosis. Mild acromioclavicular joint space narrowing and peripheral osteophytosis. No acute fracture or dislocation. Mild right lower lung linear subsegmental atelectasis versus scarring.   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     Electronically Signed   By: Yvonne Kendall M.D.   On: 09/21/2021 16:18    Past Medical/Family/Surgical/Social History: Medications & Allergies reviewed per EMR, new medications updated. Patient Active Problem List   Diagnosis Date Noted   Aortic atherosclerosis ([place]) 05/20/2021   ED (erectile dysfunction) 01/27/2021   Controlled substance agreement signed 09/17/2020   Lung nodules 05/18/2020   Atherosclerosis of native coronary artery of native heart without angina pectoris 05/18/2020   Elevated PSA 12/26/2018   Flu vaccine need 10/08/2018   Spondylosis without myelopathy or radiculopathy, lumbar region 09/14/2018   Tobacco dependence 06/01/2018   Paresthesia 01/02/2018   Gastroesophageal reflux disease without esophagitis 09/15/2017   Idiopathic peripheral neuropathy 09/15/2017   Chronic lumbar radiculopathy 02/10/2017   Hyperlipidemia 03/14/2016   Lumbar degenerative disc disease 09/11/2015  Chronic pain syndrome 09/11/2015   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   Chronic low back pain    Past Medical History:  Diagnosis Date   Chronic low back pain 2010    GERD (gastroesophageal reflux disease)    Family History   Problem Relation Age of Onset   Ovarian cancer Mother    Other Father        unsure of medical history   Heart disease Neg Hx    Hypertension Neg Hx    Diabetes Neg Hx    Colon cancer Neg Hx    Past Surgical History:  Procedure Laterality Date   COLONOSCOPY N/A 05/15/2015   Procedure: COLONOSCOPY;  Surgeon: Danie Binder, MD;  [facility];  Service: Endoscopy;  Laterality: N/A;  145 - moved to 1:30 - office to notify   COLONOSCOPY WITH PROPOFOL N/A 03/30/2021   Procedure: COLONOSCOPY WITH PROPOFOL;  Surgeon: Eloise Harman, DO;  [facility];  Service: Endoscopy;  Laterality: N/A;  10:00 / ASA 2   POLYPECTOMY  03/30/2021   Procedure: POLYPECTOMY;  Surgeon: Eloise Harman, DO;  [facility];  Service: Endoscopy;;   Social History   Occupational History   Occupation: disability    Comment: [place]   Tobacco Use   Smoking status: Former    Types: Cigarettes    Quit date: 05/15/2020    Years since quitting: 1.7   Smokeless tobacco: Never  Vaping Use   Vaping Use: Never used  Substance and Sexual Activity   Alcohol use: Yes    Alcohol/week: 0.0 standard drinks of alcohol    Comment: social    Drug use: No   Sexual activity: Never

## 2022-02-24 ENCOUNTER — Other Ambulatory Visit: Payer: Self-pay | Admitting: Internal Medicine

## 2022-02-24 DIAGNOSIS — M545 Low back pain, unspecified: Secondary | ICD-10-CM

## 2022-02-25 NOTE — Telephone Encounter (Signed)
Requested medication (s) are due for refill today -yes  Requested medication (s) are on the active medication list -yes  Future visit scheduled -yes  Last refill: 10/27/21 #30 2RF  Notes to clinic: non delegated Rx  Requested Prescriptions  Pending Prescriptions Disp Refills   traMADol (ULTRAM) 50 MG tablet [Pharmacy Med Name: TRAMADOL '50MG'$  TABLETS] 30 tablet     Sig: TAKE 1 TABLET(50 MG) BY MOUTH DAILY AS NEEDED     Not Delegated - Analgesics:  Opioid Agonists Failed - 02/24/2022  5:35 PM      Failed - This refill cannot be delegated      Passed - Urine Drug Screen completed in last 360 days      Passed - Valid encounter within last 3 months    Recent Outpatient Visits           1 month ago Prediabetes   [place] Karle Plumber B, MD   5 months ago Chronic right shoulder pain   [place], MD   9 months ago Annual physical exam   [place] Ladell Pier, MD   1 year ago Centrilobular emphysema [facility])   [place], Deborah B, MD   1 year ago Centrilobular emphysema [facility])   [place], MD       Future Appointments             In 2 weeks Elba Barman, DO [place]   In 3 months Wynetta Emery, Dalbert Batman, MD [place]               Requested Prescriptions  Pending Prescriptions Disp Refills   traMADol (ULTRAM) 50 MG tablet [Pharmacy Med Name: TRAMADOL '50MG'$  TABLETS] 30 tablet     Sig: TAKE 1 TABLET(50 MG) BY MOUTH DAILY AS NEEDED     Not Delegated - Analgesics:  Opioid Agonists Failed - 02/24/2022  5:35 PM      Failed - This refill cannot be delegated      Passed - Urine Drug Screen completed in last 360 days      Passed - Valid encounter within last 3 months    Recent Outpatient Visits           1 month ago  Prediabetes   [place], MD   5 months ago Chronic right shoulder pain   [place], MD   9 months ago Annual physical exam   [place] Ladell Pier, MD   1 year ago Centrilobular emphysema [facility])   [place], MD   1 year ago Centrilobular emphysema [facility])   [place], Deborah B, MD       Future Appointments             In 2 weeks Elba Barman, [place]   In 3 months Wynetta Emery, Dalbert Batman, MD [place]

## 2022-03-09 ENCOUNTER — Emergency Department (HOSPITAL_COMMUNITY): Payer: Medicaid Other

## 2022-03-09 ENCOUNTER — Encounter (HOSPITAL_COMMUNITY): Payer: Self-pay

## 2022-03-09 ENCOUNTER — Other Ambulatory Visit: Payer: Self-pay

## 2022-03-09 ENCOUNTER — Emergency Department (HOSPITAL_COMMUNITY)
Admission: EM | Admit: 2022-03-09 | Discharge: 2022-03-09 | Disposition: A | Discharge status: Home / Self Care | Payer: Medicaid Other | Attending: Emergency Medicine | Admitting: Emergency Medicine

## 2022-03-09 DIAGNOSIS — U071 COVID-19: Secondary | ICD-10-CM | POA: Insufficient documentation

## 2022-03-09 DIAGNOSIS — G4489 Other headache syndrome: Secondary | ICD-10-CM | POA: Diagnosis not present

## 2022-03-09 DIAGNOSIS — R509 Fever, unspecified: Secondary | ICD-10-CM | POA: Diagnosis present

## 2022-03-09 LAB — RESP PANEL BY RT-PCR (RSV, FLU A&B, COVID)  RVPGX2
Influenza A by PCR: NEGATIVE
Influenza B by PCR: NEGATIVE
Resp Syncytial Virus by PCR: NEGATIVE
SARS Coronavirus 2 by RT PCR: POSITIVE — AB

## 2022-03-09 LAB — CBG MONITORING, ED: Glucose-Capillary: 108 mg/dL — ABNORMAL HIGH (ref 70–99)

## 2022-03-09 NOTE — ED Provider Triage Note (Signed)
Emergency Medicine Provider Triage Evaluation Note  Nathaniel Robinson , a 67 y.o. male  was evaluated in triage.  Pt complains of 7 days of upper respiratory symptoms including fevers, chills, sweats, and congestion.  No N/V/D.  Hx of positive TB test, tobacco use, and aortic atherosclerosis.  Review of Systems  Positive:  Negative: See above  Physical Exam  BP 123/83   Pulse 84   Temp 98.1 F (36.7 C)   Resp 16   SpO2 96%  Gen:   Awake, no distress   Resp:  Normal effort, equal chest rise, without increased respiratory effort MSK:   Moves extremities without difficulty  Other:  Chest nonTTP.  Sitting comfortably.    Medical Decision Making  Medically screening exam initiated at 12:23 PM.  Appropriate orders placed.  Doctor Sheahan was informed that the remainder of the evaluation will be completed by another provider, this initial triage assessment does not replace that evaluation, and the importance of remaining in the ED until their evaluation is complete.     Nathaniel Rome, PA-C 03/75/43 1224

## 2022-03-09 NOTE — ED Notes (Signed)
Patient at this time expresses wishes to leave. Patient states that he does not want to wait any longer and would like to go home. Patient informed of the importance of staying to have remaining blood work collected but patient refuses and states "I'm not waiting anymore, I'm leaving now" Patient ambulatory with steady gait to ED lobby.

## 2022-03-09 NOTE — ED Provider Notes (Signed)
[facility] Provider Note   CSN: [id number] Arrival date & time: 03/09/22  1153     History  No chief complaint on file.   Nathaniel Robinson is a 67 y.o. male.  Patient with history of hyperlipidemia presents today with complaints of fevers and congestion.  He states that symptoms have been ongoing for the past 1 week. Denies any cough, shortness of breath, chest pain, nausea, vomiting, or diarrhea. No known sick contacts. He has not checked his temperature at home but states he has felt warm. No meds PTA.  The history is provided by the patient. A language interpreter was used.       Home Medications Prior to Admission medications   Medication Sig Start Date End Date Taking? Authorizing Provider  albuterol (PROAIR HFA) 108 (90 Base) MCG/ACT inhaler INHALE 2 PUFFS INTO THE LUNGS EVERY 6 HOURS AS NEEDED FOR WHEEZING OR SHORTNESS OF BREATH 07/29/20   Ladell Pier, MD  atorvastatin (LIPITOR) 20 MG tablet TAKE 1 TABLET(20 MG) BY MOUTH DAILY 11/18/21   Ladell Pier, MD  celecoxib (CELEBREX) 100 MG capsule Take 1 capsule (100 mg total) by mouth 2 (two) times daily. 02/16/22 04/17/22  Elba Barman, DO  tiotropium (SPIRIVA) 18 MCG inhalation capsule Place 1 capsule (18 mcg total) into inhaler and inhale daily. 01/25/22   Ladell Pier, MD  traMADol (ULTRAM) 50 MG tablet TAKE 1 TABLET(50 MG) BY MOUTH DAILY AS NEEDED 02/27/22   Ladell Pier, MD      Allergies    Patient has no known allergies.    Review of Systems   Review of Systems  Constitutional:  Positive for fever.  All other systems reviewed and are negative.   Physical Exam Updated Vital Signs BP 134/76   Pulse 72   Temp 97.8 F (36.6 C)   Resp 18   SpO2 95%  Physical Exam Vitals and nursing note reviewed.  Constitutional:      General: He is not in acute distress.    Appearance: Normal appearance. He is normal weight. He is not ill-appearing, toxic-appearing  or diaphoretic.  HENT:     Head: Normocephalic and atraumatic.  Cardiovascular:     Rate and Rhythm: Normal rate and regular rhythm.     Heart sounds: Normal heart sounds.  Pulmonary:     Effort: Pulmonary effort is normal. No respiratory distress.     Breath sounds: Normal breath sounds.  Abdominal:     General: Abdomen is flat.     Palpations: Abdomen is soft.     Tenderness: There is no abdominal tenderness.  Musculoskeletal:        General: Normal range of motion.     Cervical back: Normal range of motion.  Skin:    General: Skin is warm and dry.  Neurological:     General: No focal deficit present.     Mental Status: He is alert.  Psychiatric:        Mood and Affect: Mood normal.        Behavior: Behavior normal.     ED Results / Procedures / Treatments   Labs (all labs ordered are listed, but only abnormal results are displayed) Labs Reviewed  RESP PANEL BY RT-PCR (RSV, FLU A&B, COVID)  RVPGX2 - Abnormal; Notable for the following components:      Result Value   SARS Coronavirus 2 by RT PCR POSITIVE (*)    All other components within normal  limits  CBG MONITORING, ED - Abnormal; Notable for the following components:   Glucose-Capillary 108 (*)    All other components within normal limits  I-STAT CREATININE, ED    EKG None  Radiology DG Chest 2 View  Result Date: 03/09/2022 CLINICAL DATA:  URI with cough, 7+ days EXAM: CHEST - 2 VIEW COMPARISON:  07/07/2020 FINDINGS: The heart size and mediastinal contours are within normal limits. There is bibasilar pulmonary interstitial prominence which could be seen with atypical infection, edema or interstitial lung disease. There is no focal consolidation. The visualized skeletal structures are unremarkable. IMPRESSION: Nonspecific bibasilar interstitial prominence consistent with atypical infection, edema or interstitial lung disease. No focal consolidation. Electronically Signed   By: Sammie Bench M.D.   On: 03/09/2022  12:58    Procedures Procedures    Medications Ordered in ED Medications - No data to display  ED Course/ Medical Decision Making/ A&P                             Medical Decision Making  Patient presents today with complaints of congestion, fevers, and chills x 1 week. He is afebrile, non-toxic appearing, and in no acute distress with reassuring vital signs. Physical exam reveals lung sounds clear to auscultation in all fields. CXR reveals    Nonspecific bibasilar interstitial prominence consistent with atypical infection, edema or interstitial lung disease. No focal consolidation.  I have personally reviewed and interpreted this imaging and agree with radiology interpretation.  Patient is COVID positive. Symptoms consistent with same. Plan for Paxlovid, however patient does not have a recent creatinine in his chart. Discussed I-stat with patient who does not want to wait. Therefore will discharge with symptomatic treatment and close primary care follow-up for recheck chest x-ray.  Patient is understanding and amenable with plan, educated on red flag symptoms that would prompt immediate return.  Patient discharged in stable condition.   Final Clinical Impression(s) / ED Diagnoses Final diagnoses:  COVID    Rx / DC Orders ED Discharge Orders     None     An After Visit Summary was printed and given to the patient.     Nestor Lewandowsky 03/09/22 2203    Lacretia Leigh, MD 03/10/22 2137

## 2022-03-09 NOTE — Discharge Instructions (Addendum)
As we discussed, you tested positive for COVID today.  As this is a viral illness, no antibiotics are indicated.  I recommend that you get plenty of rest and focus on symptomatic relief which includes Cepacol throat lozenges for sore throat, Mucinex D (orange box) which you can get from behind the counter at your local pharmacy for congestion, and tylenol/ibuprofen as needed for fevers and bodyaches. I also recommend:  Increased fluid intake. Sports drinks offer valuable electrolytes, sugars, and fluids.  Breathing heated mist or steam (vaporizer or shower).  Eating chicken soup or other clear broths, and maintaining good nutrition.   Increasing usage of your inhaler if you have asthma.  Return to work when your temperature has returned to normal.  Gargle warm salt water and spit it out for sore throat. Take benadryl or Zyrtec to decrease sinus secretions.  Follow Up: Follow up with your primary care doctor in 5-7 days for recheck of ongoing symptoms.  Return to emergency department for emergent changing or worsening of symptoms.   ??? ????? ?? ???? ??? ??????? ?? ????? ??? ?????? ??? ???? ??-???? ???????? ??? ???? ?????? ?? ????? ???? ?????? ??? ?? ?? ???? ??? ???? ???? ??? ????? ?????? ?? ????? ?? ???? ???? ?? ??? ??? ?????? ????? ????? ? (???? ????)? ???'?? ?? ?????? ?????/?????? ????? ??? ???? ??? ????? ???'?? ????? ????   ???? ????? ???? ????? ????? ??? ?????????? ???? ???? ????  ???? ?? ?? ??? ????? (?????? ?? ???)?  ??? ??? ?? ??? ??? ??? ????? ???'?? ??? ????? ?????  ??? ??????? ????? ??? ????? ????? ?????  ???? ?? ??? ?? ???? ?? ???? ?????  ??? ?? ??? ?? ???? ??? ???? ??? ???? ???? ???? ????? benadryl ?? Zyrtec ?????  ?????? ??? ????? ???? ????? ??? 5-7 ????? ?? ?? ????? ???? ???? ?????? ???? ???? ?? ???? ????? ?? ??? ??? ???? ?????

## 2022-03-09 NOTE — ED Triage Notes (Signed)
Pt bib ems from home; flu like sympotoms x 7 says, HA, chills, sweats; denies fevers; denies N/V/D; endorses sick contacts; VSS; 128/74, 98% RA, cbg 124, 90 HR

## 2022-03-14 ENCOUNTER — Ambulatory Visit: Payer: Medicaid Other

## 2022-03-16 ENCOUNTER — Ambulatory Visit: Payer: Medicaid Other | Admitting: Sports Medicine

## 2022-03-24 NOTE — Therapy (Signed)
OUTPATIENT PHYSICAL THERAPY SHOULDER EVALUATION   Patient Name: Nathaniel Robinson MRN: [id number] DOB:Jun 07, 1955, 67 y.o., male Today's Date: 03/28/2022  END OF SESSION:  PT End of Session - 03/28/22 1016     Visit Number 1    Number of Visits 13    Date for PT Re-Evaluation 05/23/22    Authorization Type [employer] MCD    Authorization Time Period no auth    Authorization - Visit Number 1    Authorization - Number of Visits 27    PT Start Time 1017    PT Stop Time 1058    PT Time Calculation (min) 41 min    Activity Tolerance Patient tolerated treatment well;No increased pain    Behavior During Therapy WFL for tasks assessed/performed             Past Medical History:  Diagnosis Date   Chronic low back pain 2010    GERD (gastroesophageal reflux disease)    Past Surgical History:  Procedure Laterality Date   COLONOSCOPY N/A 05/15/2015   Procedure: COLONOSCOPY;  Surgeon: Danie Binder, MD;  Location: [facility];  Service: Endoscopy;  Laterality: N/A;  145 - moved to 1:30 - office to notify   COLONOSCOPY WITH PROPOFOL N/A 03/30/2021   Procedure: COLONOSCOPY WITH PROPOFOL;  Surgeon: Eloise Harman, DO;  Location: [facility];  Service: Endoscopy;  Laterality: N/A;  10:00 / ASA 2   POLYPECTOMY  03/30/2021   Procedure: POLYPECTOMY;  Surgeon: Eloise Harman, DO;  Location: [facility];  Service: Endoscopy;;   Patient Active Problem List   Diagnosis Date Noted   Aortic atherosclerosis ([place]) 05/20/2021   ED (erectile dysfunction) 01/27/2021   Controlled substance agreement signed 09/17/2020   Lung nodules 05/18/2020   Atherosclerosis of native coronary artery of native heart without angina pectoris 05/18/2020   Elevated PSA 12/26/2018   Flu vaccine need 10/08/2018   Spondylosis without myelopathy or radiculopathy, lumbar region 09/14/2018   Tobacco dependence 06/01/2018   Paresthesia 01/02/2018   Gastroesophageal reflux disease without esophagitis 09/15/2017    Idiopathic peripheral neuropathy 09/15/2017   Chronic lumbar radiculopathy 02/10/2017   Hyperlipidemia 03/14/2016   Lumbar degenerative disc disease 09/11/2015   Chronic pain syndrome 09/11/2015   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   Chronic low back pain     PCP: Ladell Pier, MD  REFERRING PROVIDER: Elba Barman, DO  REFERRING DIAG: [id number] (ICD-10-CM) - Chronic right shoulder pain  THERAPY DIAG:  Right shoulder pain, unspecified chronicity  Stiffness of right shoulder, not elsewhere classified  Muscle weakness (generalized)  Rationale for Evaluation and Treatment: Rehabilitation  ONSET DATE: 5-6 months ago   SUBJECTIVE:  SUBJECTIVE STATEMENT: Appreciate assistance of video interpreter for session.  Pt states symptoms started 5-6 months ago, no mechanism of injury, gradually came on without any changes in activity. Pt states that five months ago he received an injection for his shoulder that seemed helpful. Continues to have difficulty w/ ADLs and housework. Pt is R hand dominant typically but is having to modify activities due to pain Symptoms sometimes refer to elbow but nothing distally. No numbness/tingling, no fevers, no weight changes. Reports getting an MRI in November.   PERTINENT HISTORY: hx TB, multiple MSK issues, neuropathy Denies cardiac hx  PAIN:  Are you having pain: no Location/description: R shoulder, lateral/superior sometimes referring into upper arm Best-worst over past week: 0-7/10, gradually eases Per eval -  - aggravating factors: reaching, lifting/carrying, housework - Easing factors: injection/medication, rest  PRECAUTIONS: None  WEIGHT BEARING RESTRICTIONS: No  FALLS:  Has patient fallen in last 6 months? No  LIVING  ENVIRONMENT: Lives alone in home, performs housework/ADLs without assist   OCCUPATION: Not working currently  PLOF: Independent  PATIENT GOALS: less pain, be able to do more activity  NEXT MD VISIT: TBD  OBJECTIVE:   DIAGNOSTIC FINDINGS:  MRI 12/24/21 R shoulder IMPRESSION: 1. Moderate tendinosis of the supraspinatus tendon with a small partial-thickness bursal surface tear anteriorly. 2. Mild tendinosis of the infraspinatus tendon. 3. Mild tendinosis of the subscapularis tendon. 4. Mild tendinosis of the intra-articular portion of the long head of the biceps tendon.  PATIENT SURVEYS:  Quick DASH deferred given time constraints and interpreter services  COGNITION: Overall cognitive status: Within functional limits for tasks assessed     SENSATION: Light touch intact B UE  POSTURE: Forward head posture, rounded shoulders w/ R UT elevation  UPPER EXTREMITY ROM:  A/PROM Right eval Left eval  Shoulder flexion 142 * 156  Shoulder abduction 118 * 160  Shoulder internal rotation    Shoulder external rotation    Elbow flexion    Elbow extension    Wrist flexion    Wrist extension     (Blank rows = not tested) (Key: WFL = within functional limits not formally assessed, * = concordant pain, s = stiffness/stretching sensation, NT = not tested)  Comments: cervical rotation/flexion/extension grossly symmetrical, WNL and painless   UPPER EXTREMITY MMT:  MMT Right eval Left eval  Shoulder flexion 4 * 5  Shoulder abduction 4 * 5  Shoulder extension    Shoulder internal rotation 4 5  Shoulder external rotation 4 5  Elbow flexion 4 4  Elbow extension 4 4  Grip strength    (Blank rows = not tested)  (Key: WFL = within functional limits not formally assessed, * = concordant pain, s = stiffness/stretching sensation, NT = not tested)  Comments:   PALPATION:  Concordant tenderness R infraspinatus/teres, deltoid, biceps. Generalized tightness throughout periscapular  musculature but nonpainful   TODAY'S TREATMENT:  [facility] Adult PT Treatment:                                                DATE: 03/28/22 Therapeutic Exercise: Cross body horizontal adduction 30sec hold cues for appropriate posture and HEP Shoulder flexion counter walkbacks x10  Verbal HEP review, teach back   PATIENT EDUCATION: Education details: Pt education on PT impairments, prognosis, and POC. Informed consent. Rationale for interventions, safe/appropriate HEP performance Person educated: Patient Education method: Explanation, Demonstration, Tactile cues, Verbal cues Education comprehension: verbalized understanding, returned demonstration, verbal cues required, tactile cues required, and needs further education    HOME EXERCISE PROGRAM: Horizontal add 3x30sec Shoulder flexion walk back at counter 3x10 Pt verbalizes good understanding and tolerates performance well   ASSESSMENT:  CLINICAL IMPRESSION: Patient is a 67 y.o. gentleman who was seen today for physical therapy evaluation and treatment for R shoulder pain ongoing for past few months which is affecting his tolerance to typical daily activities, R hand dominant. On exam pt demos concordant tenderness of GH musculature, limitations in Riley mobility and strength as described above. Teach back method with HEP, pt tolerates well without adverse event. Recommend skilled PT to address these deficits and maximize functional independence. Pt departs today's session in no acute distress, all voiced questions/concerns addressed appropriately from PT perspective.    OBJECTIVE IMPAIRMENTS: decreased activity tolerance, decreased endurance, decreased mobility, decreased ROM, decreased strength, hypomobility, increased muscle spasms, impaired UE functional use, improper body mechanics, postural dysfunction, and  pain.   ACTIVITY LIMITATIONS: carrying, lifting, and reach over head  PARTICIPATION LIMITATIONS: meal prep, cleaning, and laundry  PERSONAL FACTORS: Age are also affecting patient's functional outcome.   REHAB POTENTIAL: Good  CLINICAL DECISION MAKING: Stable/uncomplicated  EVALUATION COMPLEXITY: Low   GOALS: Goals reviewed with patient? No given time constraints and language barrier  SHORT TERM GOALS: Target date: 04/18/2022 Pt will demonstrate appropriate understanding and performance of initially prescribed HEP in order to facilitate improved independence with management of symptoms.  Baseline: HEP provided on eval Goal status: INITIAL    LONG TERM GOALS: Target date: 05/09/2022 Pt will report 0-4/10 pain on average w/ daily activities in order to facilitate improved tolerance to ADLs.  Baseline: 0-7/10 pain with typical daily activities Goal status: INITIAL  2.  Pt will demonstrate at least 145 degrees of active shoulder elevation on RUE in order to demonstrate improved tolerance to upper body dressing and housework. Baseline: see ROM chart above Goal status: INITIAL  3.  Pt will demonstrate at least 4+/5 shoulder ER/IR MMT for improved symmetry of UE strength and improved tolerance to functional movements.  Baseline: see MMT chart above Goal status: INITIAL  4. Pt will report/demonstrate ability to perform upper body with less than 2 point increase in pain on NPS in order to indicative improved tolerance/independence with ADLs.  Baseline: up to 7/10 pain with daily activities  Goal status: INITIAL   5. Pt will demonstrate independence w/ final prescribed HEP in order to facilitate improved pt self efficacy with management of symptoms.   Baseline: HEP prescribed on eval  Goal status: INITIAL  PLAN:  PT FREQUENCY: 2x/week  PT DURATION: 6 weeks  PLANNED INTERVENTIONS: Therapeutic exercises, Therapeutic activity, Neuromuscular re-education, Balance training, Gait  training, Patient/Family education, Self Care, Joint mobilization, DME instructions, Dry Needling, Electrical stimulation, Spinal mobilization, Cryotherapy, Moist heat, Taping,  Manual therapy, and Re-evaluation  PLAN FOR NEXT SESSION: Progress ROM/strengthening exercises as able/appropriate, review HEP.    Leeroy Cha PT, DPT 03/28/2022 1:12 PM

## 2022-03-28 ENCOUNTER — Other Ambulatory Visit: Payer: Self-pay

## 2022-03-28 ENCOUNTER — Encounter: Payer: Self-pay | Admitting: Physical Therapy

## 2022-03-28 ENCOUNTER — Ambulatory Visit: Payer: Medicaid Other | Attending: Sports Medicine | Admitting: Physical Therapy

## 2022-03-28 DIAGNOSIS — M25611 Stiffness of right shoulder, not elsewhere classified: Secondary | ICD-10-CM | POA: Insufficient documentation

## 2022-03-28 DIAGNOSIS — G8929 Other chronic pain: Secondary | ICD-10-CM | POA: Diagnosis not present

## 2022-03-28 DIAGNOSIS — M25511 Pain in right shoulder: Secondary | ICD-10-CM | POA: Insufficient documentation

## 2022-03-28 DIAGNOSIS — M6281 Muscle weakness (generalized): Secondary | ICD-10-CM | POA: Diagnosis present

## 2022-04-04 ENCOUNTER — Ambulatory Visit: Payer: Medicaid Other | Admitting: Physical Therapy

## 2022-04-05 ENCOUNTER — Other Ambulatory Visit: Payer: Self-pay | Admitting: Sports Medicine

## 2022-04-06 NOTE — Therapy (Signed)
OUTPATIENT PHYSICAL THERAPY TREATMENT NOTE   Patient Name: Nathaniel Robinson MRN: [id number] DOB:1955/03/15, 67 y.o., male Today's Date: 04/07/2022  PCP: Ladell Pier, MD  REFERRING PROVIDER: Elba Barman, DO   END OF SESSION:   PT End of Session - 04/07/22 0933     Visit Number 2    Number of Visits 13    Date for PT Re-Evaluation 05/23/22    Authorization Type [employer] MCD    Authorization Time Period no auth    Authorization - Visit Number 2    Authorization - Number of Visits 27    PT Start Time 0933    PT Stop Time 1015    PT Time Calculation (min) 42 min    Activity Tolerance Patient tolerated treatment well;No increased pain    Behavior During Therapy WFL for tasks assessed/performed             Past Medical History:  Diagnosis Date   Chronic low back pain 2010    GERD (gastroesophageal reflux disease)    Past Surgical History:  Procedure Laterality Date   COLONOSCOPY N/A 05/15/2015   Procedure: COLONOSCOPY;  Surgeon: Danie Binder, MD;  [facility]: [facility];  Service: Endoscopy;  Laterality: N/A;  145 - moved to 1:30 - office to notify   COLONOSCOPY WITH PROPOFOL N/A 03/30/2021   Procedure: COLONOSCOPY WITH PROPOFOL;  Surgeon: Eloise Harman, DO;  Location: [facility];  Service: Endoscopy;  Laterality: N/A;  10:00 / ASA 2   POLYPECTOMY  03/30/2021   Procedure: POLYPECTOMY;  Surgeon: Eloise Harman, DO;  Location: [facility];  Service: Endoscopy;;   Patient Active Problem List   Diagnosis Date Noted   Aortic atherosclerosis ([place]) 05/20/2021   ED (erectile dysfunction) 01/27/2021   Controlled substance agreement signed 09/17/2020   Lung nodules 05/18/2020   Atherosclerosis of native coronary artery of native heart without angina pectoris 05/18/2020   Elevated PSA 12/26/2018   Flu vaccine need 10/08/2018   Spondylosis without myelopathy or radiculopathy, lumbar region 09/14/2018   Tobacco dependence 06/01/2018   Paresthesia 01/02/2018    Gastroesophageal reflux disease without esophagitis 09/15/2017   Idiopathic peripheral neuropathy 09/15/2017   Chronic lumbar radiculopathy 02/10/2017   Hyperlipidemia 03/14/2016   Lumbar degenerative disc disease 09/11/2015   Chronic pain syndrome 09/11/2015   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   Chronic low back pain     REFERRING DIAG: M25.511,G89.29 (ICD-10-CM) - Chronic right shoulder pain   THERAPY DIAG:  Right shoulder pain, unspecified chronicity  Stiffness of right shoulder, not elsewhere classified  Muscle weakness (generalized)  Rationale for Evaluation and Treatment Rehabilitation  PERTINENT HISTORY: hx TB, multiple MSK issues, neuropathy Denies cardiac hx  PRECAUTIONS: none  SUBJECTIVE:  SUBJECTIVE STATEMENT:  Pain today is 4/10   Used to be 7/10   EVAL- Appreciate assistance of video interpreter for session.  Pt states symptoms started 5-6 months ago, no mechanism of injury, gradually came on without any changes in activity. Pt states that five months ago he received an injection for his shoulder that seemed helpful. Continues to have difficulty w/ ADLs and housework. Pt is R hand dominant typically but is having to modify activities due to pain Symptoms sometimes refer to elbow but nothing distally. No numbness/tingling, no fevers, no weight changes. Reports getting an MRI in November.   PAIN: PAIN:  Are you having pain: no Location/description: R shoulder, lateral/superior sometimes referring into upper arm Best-worst over past week: 0-7/10, gradually eases Per eval -  - aggravating factors: reaching, lifting/carrying, housework - Easing factors: injection/medication, rest    OBJECTIVE: (objective measures completed at initial evaluation unless  otherwise dated)   DIAGNOSTIC FINDINGS:  MRI 12/24/21 R shoulder IMPRESSION: 1. Moderate tendinosis of the supraspinatus tendon with a small partial-thickness bursal surface tear anteriorly. 2. Mild tendinosis of the infraspinatus tendon. 3. Mild tendinosis of the subscapularis tendon. 4. Mild tendinosis of the intra-articular portion of the long head of the biceps tendon.   PATIENT SURVEYS:  Quick DASH deferred given time constraints and interpreter services   COGNITION: Overall cognitive status: Within functional limits for tasks assessed                                  SENSATION: Light touch intact B UE   POSTURE: Forward head posture, rounded shoulders w/ R UT elevation   UPPER EXTREMITY ROM:   A/PROM Right eval Left eval  Shoulder flexion 142 * 156  Shoulder abduction 118 * 160  Shoulder internal rotation      Shoulder external rotation      Elbow flexion      Elbow extension      Wrist flexion      Wrist extension       (Blank rows = not tested) (Key: WFL = within functional limits not formally assessed, * = concordant pain, s = stiffness/stretching sensation, NT = not tested)  Comments: cervical rotation/flexion/extension grossly symmetrical, WNL and painless    UPPER EXTREMITY MMT:   MMT Right eval Left eval  Shoulder flexion 4 * 5  Shoulder abduction 4 * 5  Shoulder extension      Shoulder internal rotation 4 5  Shoulder external rotation 4 5  Elbow flexion 4 4  Elbow extension 4 4  Grip strength      (Blank rows = not tested)  (Key: WFL = within functional limits not formally assessed, * = concordant pain, s = stiffness/stretching sensation, NT = not tested)  Comments:    PALPATION:  Concordant tenderness R infraspinatus/teres, deltoid, biceps. Generalized tightness throughout periscapular musculature but nonpainful             TODAY'S TREATMENT:       [facility] Adult PT Treatment:                                                DATE:  04-07-22 Therapeutic Exercise: Passive Shoulder Flexion Walk-Back with Table  3 sets - 10 reps required VC through interpreter and TC for all exercises  Standing Shoulder Posterior Capsule Stretch 3 reps - 30 sec hold Shoulder External Rotation and Scapular Retraction with Resistance  3 sets - 10 reps Standing Shoulder Diagonal Horizontal Abduction 60/120 Degrees with Resistance  3 sets - 10 reps Shoulder Abduction - Thumbs Up  3 sets - 10 reps Standing Bicep Curls with Resistance  Manual Therapy: STW to shld girdle Shoulder mobs for  IR and inf glide with distraction Trigger Point Dry Needling Treatment: Pre-treatment instruction: Patient instructed on dry needling rationale, procedures, and possible side effects including pain during treatment (achy,cramping feeling), bruising, drop of blood, lightheadedness, nausea, sweating. Patient Consent Given: Yes  through interpreter Education handout provided: Yes Muscles treated: RT supraspinatus and infraspinatus, and biceps  Needle size and number: .30x74m x 3 Electrical stimulation performed: No Parameters: N/A Treatment response/outcome: Twitch response elicited and Palpable decrease in muscle tension Post-treatment instructions: Patient instructed to expect possible mild to moderate muscle soreness later today and/or tomorrow. Patient instructed in methods to reduce muscle soreness and to continue prescribed HEP. If patient was dry needled over the lung field, patient was instructed on signs and symptoms of pneumothorax and, however unlikely, to see immediate medical attention should they occur. Patient was also educated on signs and symptoms of infection and to seek medical attention should they occur. Patient verbalized understanding of these instructions and education.   Time/Interpreter needed to explain TPDN, benefits and how to correctly perform exercises                                      [facility]                                                 [facility]: [facility] Therapeutic Exercise: Cross body horizontal adduction 30sec hold cues for appropriate posture and HEP Shoulder flexion counter walkbacks x10  Verbal HEP review, teach back     PATIENT EDUCATION: Education details: Pt education on PT impairments, prognosis, and POC. Informed consent. Rationale for interventions, safe/appropriate HEP performance Person educated: Patient Education method: Explanation, Demonstration, Tactile cues, Verbal cues Education comprehension: verbalized understanding, returned demonstration, verbal cues required, tactile cues required, and needs further education     HOME EXERCISE PROGRAM: Eval-Horizontal add 3x30sec Shoulder flexion walk back at counter 3x10 Pt verbalizes good understanding and tolerates performance well Access Code: [email or web address] URL: [email or web address] Date: 04/07/2022 Prepared by: LVoncille Lo Exercises - Passive Shoulder Flexion Walk-Back with Table  - 1 x daily - 7 x weekly - 3 sets - 10 reps - Standing Shoulder Posterior Capsule Stretch  - 1 x daily - 7 x weekly - 1 sets - 3 reps - 30 sec hold - Shoulder External Rotation and Scapular Retraction with Resistance  - 1 x daily - 7 x weekly - 3 sets - 10 reps - Standing Shoulder Diagonal Horizontal Abduction 60/120 Degrees with Resistance  - 1 x daily - 7 x weekly - 3 sets - 10 reps - Shoulder Abduction - Thumbs Up  - 1 x daily - 7 x weekly - 3 sets - 10 reps - Standing Bicep Curls with Resistance  - 1 x daily - 7 x weekly - 3 sets - 10 reps   ASSESSMENT:   CLINICAL IMPRESSION: Pt enters clinic with  4/10 pain and states through interpreter that his meds help. He consents to [facility] through interpreter and states after TPDN he has 5/10 pain.  Pt was performing exercise given at eval improperly and  PT spent time reinforcing proper execution.  Pt  was able to do  the exercises with interpreter and return demo. Will continue toward completion of  all goals.  Pt departs today's session in no acute distress, all voiced questions/concerns addressed appropriately from PT perspective.   EVAL- Patient is a 67 y.o. gentleman who was seen today for physical therapy evaluation and treatment for R shoulder pain ongoing for past few months which is affecting his tolerance to typical daily activities, R hand dominant. On exam pt demos concordant tenderness of GH musculature, limitations in [place] mobility and strength as described above. Teach back method with HEP, pt tolerates well without adverse event. Recommend skilled PT to address these deficits and maximize functional independence. Pt departs today's session in no acute distress, all voiced questions/concerns addressed appropriately from PT perspective.     OBJECTIVE IMPAIRMENTS: decreased activity tolerance, decreased endurance, decreased mobility, decreased ROM, decreased strength, hypomobility, increased muscle spasms, impaired UE functional use, improper body mechanics, postural dysfunction, and pain.    ACTIVITY LIMITATIONS: carrying, lifting, and reach over head   PARTICIPATION LIMITATIONS: meal prep, cleaning, and laundry   PERSONAL FACTORS: Age are also affecting patient's functional outcome.    REHAB POTENTIAL: Good   CLINICAL DECISION MAKING: Stable/uncomplicated   EVALUATION COMPLEXITY: Low     GOALS: Goals reviewed with patient? No given time constraints and language barrier   SHORT TERM GOALS: Target date: 04/18/2022 Pt will demonstrate appropriate understanding and performance of initially prescribed HEP in order to facilitate improved independence with management of symptoms.  Baseline: HEP provided on eval Goal status: INITIAL      LONG TERM GOALS: Target date: 05/09/2022 Pt will report 0-4/10 pain on average w/ daily activities in order to facilitate improved tolerance to ADLs.  Baseline: 0-7/10 pain with typical daily activities Goal status: INITIAL   2.  Pt will  demonstrate at least 145 degrees of active shoulder elevation on RUE in order to demonstrate improved tolerance to upper body dressing and housework. Baseline: see ROM chart above Goal status: INITIAL   3.  Pt will demonstrate at least 4+/5 shoulder ER/IR MMT for improved symmetry of UE strength and improved tolerance to functional movements.  Baseline: see MMT chart above Goal status: INITIAL   4. Pt will report/demonstrate ability to perform upper body with less than 2 point increase in pain on NPS in order to indicative improved tolerance/independence with ADLs.            Baseline: up to 7/10 pain with daily activities            Goal status: INITIAL    5. Pt will demonstrate independence w/ final prescribed HEP in order to facilitate improved pt self efficacy with management of symptoms.             Baseline: HEP prescribed on eval            Goal status: INITIAL   PLAN:   PT FREQUENCY: 2x/week   PT DURATION: 6 weeks   PLANNED INTERVENTIONS: Therapeutic exercises, Therapeutic activity, Neuromuscular re-education, Balance training, Gait training, Patient/Family education, Self Care, Joint mobilization, DME instructions, Dry Needling, Electrical stimulation, Spinal mobilization, Cryotherapy, Moist heat, Taping, Manual therapy, and Re-evaluation   PLAN FOR NEXT SESSION: Progress ROM/strengthening exercises as  able/appropriate, review HEP.   Voncille Lo, PT, [place] Certified Exercise Expert for the Aging Adult  04/07/22 1:15 PM Phone: [phone] Fax: [phone]

## 2022-04-07 ENCOUNTER — Encounter: Payer: Self-pay | Admitting: Physical Therapy

## 2022-04-07 ENCOUNTER — Ambulatory Visit: Payer: Medicaid Other | Admitting: Physical Therapy

## 2022-04-07 DIAGNOSIS — M25511 Pain in right shoulder: Secondary | ICD-10-CM | POA: Diagnosis not present

## 2022-04-07 DIAGNOSIS — M25611 Stiffness of right shoulder, not elsewhere classified: Secondary | ICD-10-CM

## 2022-04-07 DIAGNOSIS — M6281 Muscle weakness (generalized): Secondary | ICD-10-CM

## 2022-04-07 NOTE — Patient Instructions (Signed)
  Interpreter helped with translation to pt in [place] Dry Needling  What is Trigger Point Dry Needling (DN)? DN is a physical therapy technique used to treat muscle pain and dysfunction. Specifically, DN helps deactivate muscle trigger points (muscle knots).  A thin filiform needle is used to penetrate the skin and stimulate the underlying trigger point. The goal is for a local twitch response (LTR) to occur and for the trigger point to relax. No medication of any kind is injected during the procedure.   What Does Trigger Point Dry Needling Feel Like?  The procedure feels different for each individual patient. Some patients report that they do not actually feel the needle enter the skin and overall the process is not painful. Very mild bleeding may occur. However, many patients feel a deep cramping in the muscle in which the needle was inserted. This is the local twitch response.   How Will I feel after the treatment? Soreness is normal, and the onset of soreness may not occur for a few hours. Typically this soreness does not last longer than two days.  Bruising is uncommon, however; ice can be used to decrease any possible bruising.  In rare cases feeling tired or nauseous after the treatment is normal. In addition, your symptoms may get worse before they get better, this period will typically not last longer than 24 hours.   What Can I do After My Treatment? Increase your hydration by drinking more water for the next 24 hours. You may place ice or heat on the areas treated that have become sore, however, do not use heat on inflamed or bruised areas. Heat often brings more relief post needling. You can continue your regular activities, but vigorous activity is not recommended initially after the treatment for 24 hours. DN is best combined with other physical therapy such as strengthening, stretching, and other therapies.     Voncille Lo, PT, [place] Certified Exercise Expert  for the Aging Adult  04/07/22 9:42 AM Phone: [phone] Fax: [phone]

## 2022-04-12 ENCOUNTER — Encounter: Payer: Self-pay | Admitting: Physical Therapy

## 2022-04-12 ENCOUNTER — Ambulatory Visit: Payer: Medicaid Other | Admitting: Physical Therapy

## 2022-04-12 DIAGNOSIS — M25611 Stiffness of right shoulder, not elsewhere classified: Secondary | ICD-10-CM

## 2022-04-12 DIAGNOSIS — M25511 Pain in right shoulder: Secondary | ICD-10-CM

## 2022-04-12 DIAGNOSIS — M6281 Muscle weakness (generalized): Secondary | ICD-10-CM

## 2022-04-12 NOTE — Therapy (Signed)
OUTPATIENT PHYSICAL THERAPY TREATMENT NOTE   Patient Name: Nathaniel Robinson MRN: [id number] DOB:10/20/1955, 67 y.o., male Today's Date: 04/14/2022  PCP: Ladell Pier, MD  REFERRING PROVIDER: Elba Barman, DO   END OF SESSION:   PT End of Session - 04/14/22 0942     Visit Number 4    Number of Visits 13    Date for PT Re-Evaluation 05/23/22    Authorization Type [employer] MCD    Authorization Time Period no auth    PT Start Time [phone]    PT Stop Time 1015    PT Time Calculation (min) 38 min    Activity Tolerance Patient tolerated treatment well;No increased pain    Behavior During Therapy WFL for tasks assessed/performed               Past Medical History:  Diagnosis Date   Chronic low back pain 2010    GERD (gastroesophageal reflux disease)    Past Surgical History:  Procedure Laterality Date   COLONOSCOPY N/A 05/15/2015   Procedure: COLONOSCOPY;  Surgeon: Danie Binder, MD;  [facility]: [facility];  Service: Endoscopy;  Laterality: N/A;  145 - moved to 1:30 - office to notify   COLONOSCOPY WITH PROPOFOL N/A 03/30/2021   Procedure: COLONOSCOPY WITH PROPOFOL;  Surgeon: Eloise Harman, DO;  Location: [facility];  Service: Endoscopy;  Laterality: N/A;  10:00 / ASA 2   POLYPECTOMY  03/30/2021   Procedure: POLYPECTOMY;  Surgeon: Eloise Harman, DO;  Location: [facility];  Service: Endoscopy;;   Patient Active Problem List   Diagnosis Date Noted   Aortic atherosclerosis ([place]) 05/20/2021   ED (erectile dysfunction) 01/27/2021   Controlled substance agreement signed 09/17/2020   Lung nodules 05/18/2020   Atherosclerosis of native coronary artery of native heart without angina pectoris 05/18/2020   Elevated PSA 12/26/2018   Flu vaccine need 10/08/2018   Spondylosis without myelopathy or radiculopathy, lumbar region 09/14/2018   Tobacco dependence 06/01/2018   Paresthesia 01/02/2018   Gastroesophageal reflux disease without esophagitis 09/15/2017   Idiopathic  peripheral neuropathy 09/15/2017   Chronic lumbar radiculopathy 02/10/2017   Hyperlipidemia 03/14/2016   Lumbar degenerative disc disease 09/11/2015   Chronic pain syndrome 09/11/2015   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   Chronic low back pain     REFERRING DIAG: M25.511,G89.29 (ICD-10-CM) - Chronic right shoulder pain   THERAPY DIAG:  Right shoulder pain, unspecified chronicity  Muscle weakness (generalized)  Stiffness of right shoulder, not elsewhere classified  Rationale for Evaluation and Treatment Rehabilitation  PERTINENT HISTORY: hx TB, multiple MSK issues, neuropathy Denies cardiac hx  PRECAUTIONS: none  SUBJECTIVE:  SUBJECTIVE STATEMENT:  Pt reports 4/10 pain and he is taking medication( celbrex) that helps. If I move my arm, it does not hurt   If I use it it hurts  EVAL- Appreciate assistance of video interpreter for session.  Pt states symptoms started 5-6 months ago, no mechanism of injury, gradually came on without any changes in activity. Pt states that five months ago he received an injection for his shoulder that seemed helpful. Continues to have difficulty w/ ADLs and housework. Pt is R hand dominant typically but is having to modify activities due to pain Symptoms sometimes refer to elbow but nothing distally. No numbness/tingling, no fevers, no weight changes. Reports getting an MRI in November.   PAIN: PAIN:  Are you having pain: no Location/description: R shoulder, lateral/superior sometimes referring into upper arm Best-worst over past week: 0-7/10, gradually eases Per eval -  - aggravating factors: reaching, lifting/carrying, housework - Easing factors: injection/medication, rest    OBJECTIVE: (objective measures completed at initial  evaluation unless otherwise dated)   DIAGNOSTIC FINDINGS:  MRI 12/24/21 R shoulder IMPRESSION: 1. Moderate tendinosis of the supraspinatus tendon with a small partial-thickness bursal surface tear anteriorly. 2. Mild tendinosis of the infraspinatus tendon. 3. Mild tendinosis of the subscapularis tendon. 4. Mild tendinosis of the intra-articular portion of the long head of the biceps tendon.   PATIENT SURVEYS:  Quick DASH deferred given time constraints and interpreter services   COGNITION: Overall cognitive status: Within functional limits for tasks assessed                                  SENSATION: Light touch intact B UE   POSTURE: Forward head posture, rounded shoulders w/ R UT elevation   UPPER EXTREMITY ROM:   A/PROM Right eval Left eval LT 04-12-22  Shoulder flexion 142 * 156 150  Shoulder abduction 118 * 160 123  Shoulder internal rotation     t_10  Shoulder external rotation       Elbow flexion       Elbow extension       Wrist flexion       Wrist extension        (Blank rows = not tested) (Key: WFL = within functional limits not formally assessed, * = concordant pain, s = stiffness/stretching sensation, NT = not tested)  Comments: cervical rotation/flexion/extension grossly symmetrical, WNL and painless    UPPER EXTREMITY MMT:   MMT Right eval Left eval  Shoulder flexion 4 * 5  Shoulder abduction 4 * 5  Shoulder extension      Shoulder internal rotation 4 5  Shoulder external rotation 4 5  Elbow flexion 4 4  Elbow extension 4 4  Grip strength      (Blank rows = not tested)  (Key: WFL = within functional limits not formally assessed, * = concordant pain, s = stiffness/stretching sensation, NT = not tested)  Comments:    PALPATION:  Concordant tenderness R infraspinatus/teres, deltoid, biceps. Generalized tightness throughout periscapular musculature but nonpainful             TODAY'S TREATMENT:   [facility] Adult PT Treatment:                                                 DATE:  04-14-22 Therapeutic Exercise: Horizontal Abduction GTB 30 x LT Sidelying  RT ER with towel roll using 6 # DB LT sidelying RT abduction with 8 # DB LT sidelying RT flexion with 8 # DB RT OH press with 10 # DB Shoulder extension with -BTB 3 sets - 10 reps Standing Bicep Curls with Resistance  3 sets - 10 reps 10 # Seated bend over row with 10 # 3 x 10 Manual Therapy: STW to shld girdle over  RT rib mob PA 5,6,7 1st rib Rt mob with concordant pain to shld but improved  Shoulder mobs for  IR and inf glide with distraction   [facility] Adult PT Treatment:                                                DATE: 04-12-22 Therapeutic Exercise: Horizontal Abduction GTB 30 x Passive Shoulder Flexion Walk-Back with Table  1 x 10 Standing Shoulder Posterior Capsule Stretch  2  reps - 30 sec hold Shoulder extension with -BTB 3 sets - 10 reps Shoulder External Rotation and Scapular Retraction GTB 3 sets - 10 reps Standing Shoulder Diagonal Horizontal Abduction 60/120 Degrees with BTB - 1 x daily - 7 x weekly - 3 sets - 10 reps Standing Shoulder Horizontal Abduction with BTB 3 sets - 10 reps Standing Bicep Curls with Resistance  3 sets - 10 reps Seated Serratus Press with Anchored GTB  3 sets - 10 reps MANUAL- STW to shld girdle Shoulder mobs for  IR and inf glide with distraction Trigger Point Dry Needling Treatment: Pre-treatment instruction: Patient instructed on dry needling rationale, procedures, and possible side effects including pain during treatment (achy,cramping feeling), bruising, drop of blood, lightheadedness, nausea, sweating. Patient Consent Given: Yes  through interpreter Education handout provided: Yes Muscles treated: RT levator and triceps infraspinatus and biceps  Needle size and number: .30x24m x 3 Electrical stimulation performed: No Parameters: N/A Treatment response/outcome: Twitch response elicited and Palpable decrease in muscle  tension Post-treatment instructions: Patient instructed to expect possible mild to moderate muscle soreness later today and/or tomorrow. Patient instructed in methods to reduce muscle soreness and to continue prescribed HEP. If patient was dry needled over the lung field, patient was instructed on signs and symptoms of pneumothorax and, however unlikely, to see immediate medical attention should they occur. Patient was also educated on signs and symptoms of infection and to seek medical attention should they occur. Patient verbalized understanding of these instructions and education.   Time/Interpreter needed to explain TPDN, benefits and how to correctly perform exercises     [facility]                                                [facility]: 04[facility]22 Therapeutic Exercise: Passive Shoulder Flexion Walk-Back with Table  3 sets - 10 reps required VC through interpreter and TC for all exercises Standing Shoulder Posterior Capsule Stretch 3 reps - 30 sec hold Shoulder External Rotation and Scapular Retraction with Resistance  3 sets - 10 reps Standing Shoulder Diagonal Horizontal Abduction 60/120 Degrees with Resistance  3 sets - 10 reps Shoulder Abduction - Thumbs Up  3 sets - 10 reps Standing Bicep Curls with Resistance  Manual Therapy: STW to shld girdle Shoulder mobs for  IR and inf glide with distraction Trigger Point Dry Needling Treatment: Pre-treatment instruction: Patient instructed on dry needling rationale, procedures, and possible side effects including pain during treatment (achy,cramping feeling), bruising, drop of blood, lightheadedness, nausea, sweating. Patient Consent Given: Yes  through interpreter Education handout provided: Yes Muscles treated: RT supraspinatus and infraspinatus, and biceps  Needle size and number: .30x80m x 3 Electrical stimulation performed: No Parameters: N/A Treatment response/outcome: Twitch response elicited and Palpable decrease in muscle  tension Post-treatment instructions: Patient instructed to expect possible mild to moderate muscle soreness later today and/or tomorrow. Patient instructed in methods to reduce muscle soreness and to continue prescribed HEP. If patient was dry needled over the lung field, patient was instructed on signs and symptoms of pneumothorax and, however unlikely, to see immediate medical attention should they occur. Patient was also educated on signs and symptoms of infection and to seek medical attention should they occur. Patient verbalized understanding of these instructions and education.   Time/Interpreter needed to explain TPDN, benefits and how to correctly perform exercises                                      [facility]                                                [facility]: 03/28/22 Therapeutic Exercise: Cross body horizontal adduction 30sec hold cues for appropriate posture and HEP Shoulder flexion counter walkbacks x10  Verbal HEP review, teach back     PATIENT EDUCATION: Education details: Pt education on PT impairments, prognosis, and POC. Informed consent. Rationale for interventions, safe/appropriate HEP performance Person educated: Patient Education method: Explanation, Demonstration, Tactile cues, Verbal cues Education comprehension: verbalized understanding, returned demonstration, verbal cues required, tactile cues required, and needs further education     HOME EXERCISE PROGRAM:  Access Code: [email or web address] URL: [email or web address] Date: 04/14/2022 Prepared by: LVoncille Lo Exercises - Passive Shoulder Flexion Walk-Back with Table  - 1 x daily - 7 x weekly - 3 sets - 10 reps - Standing Shoulder Posterior Capsule Stretch  - 1 x daily - 7 x weekly - 1 sets - 3 reps - 30 sec hold - Shoulder extension with resistance - Neutral  - 1 x daily - 7 x weekly - 3 sets - 10 reps - Shoulder External Rotation and Scapular Retraction with Resistance  - 1 x daily - 7 x  weekly - 3 sets - 10 reps - Standing Shoulder Diagonal Horizontal Abduction 60/120 Degrees with Resistance  - 1 x daily - 7 x weekly - 3 sets - 10 reps - Standing Shoulder Horizontal Abduction with Resistance  - 1 x daily - 7 x weekly - 3 sets - 10 reps - Standing Bicep Curls with Resistance  - 1 x daily - 7 x weekly - 3 sets - 10 reps - Seated Serratus Press with Anchored Resistance  - 1 x daily - 7 x weekly - 3 sets - 10 reps - Seated single arm Bent over shld Row with DB or bag  - 1 x daily - 7 x weekly - 3 sets - 10 reps   ASSESSMENT:   CLINICAL IMPRESSION: [facility]  [facility] and states through interpreter that his meds help and he cannot tell if TPDN helps because he is taking his med. Pt with flat affect.  RX session concentrated on strengthening and manual over RT shoulder, rib mobs especially 1st rib mobs which reproduced  RT Shld pain.  Pt with increased AROM after RX but states he is in 5/10 pain upon leaving session.  Pt  was able to do  the exercises with interpreter and return demo with added exercise to HEP. Will continue toward completion of all goals.  Pt departs today's session in no acute distress, all voiced questions/concerns addressed appropriately from PT perspective through interpreter.  Pt did state he was not going to take medication when he returns Monday to test if exercise helps.   EVAL- Patient is a 67 y.o. gentleman who was seen today for physical therapy evaluation and treatment for R shoulder pain ongoing for past few months which is affecting his tolerance to typical daily activities, R hand dominant. On exam pt demos concordant tenderness of GH musculature, limitations in [place] mobility and strength as described above. Teach back method with HEP, pt tolerates well without adverse event. Recommend skilled PT to address these deficits and maximize functional independence. Pt departs today's session in no acute distress, all voiced questions/concerns  addressed appropriately from PT perspective.     OBJECTIVE IMPAIRMENTS: decreased activity tolerance, decreased endurance, decreased mobility, decreased ROM, decreased strength, hypomobility, increased muscle spasms, impaired UE functional use, improper body mechanics, postural dysfunction, and pain.    ACTIVITY LIMITATIONS: carrying, lifting, and reach over head   PARTICIPATION LIMITATIONS: meal prep, cleaning, and laundry   PERSONAL FACTORS: Age are also affecting patient's functional outcome.    REHAB POTENTIAL: Good   CLINICAL DECISION MAKING: Stable/uncomplicated   EVALUATION COMPLEXITY: Low     GOALS: Goals reviewed with patient? No given time constraints and language barrier   SHORT TERM GOALS: Target date: 04/18/2022 Pt will demonstrate appropriate understanding and performance of initially prescribed HEP in order to facilitate improved independence with management of symptoms.  Baseline: HEP provided on eval Goal status: INITIAL      LONG TERM GOALS: Target date: 05/09/2022 Pt will report 0-4/10 pain on average w/ daily activities in order to facilitate improved tolerance to ADLs.  Baseline: 0-7/10 pain with typical daily activities Goal status: INITIAL   2.  Pt will demonstrate at least 145 degrees of active shoulder elevation on RUE in order to demonstrate improved tolerance to upper body dressing and housework. Baseline: see ROM chart above Goal status: INITIAL   3.  Pt will demonstrate at least 4+/5 shoulder ER/IR MMT for improved symmetry of UE strength and improved tolerance to functional movements.  Baseline: see MMT chart above Goal status: INITIAL   4. Pt will report/demonstrate ability to perform upper body with less than 2 point increase in pain on NPS in order to indicative improved tolerance/independence with ADLs.            Baseline: up to 7/10 pain with daily activities            Goal status: INITIAL    5. Pt will demonstrate independence w/ final  prescribed HEP in order to facilitate improved pt self efficacy with management of symptoms.             Baseline: HEP prescribed on eval            Goal status: INITIAL   PLAN:   PT FREQUENCY:  2x/week   PT DURATION: 6 weeks   PLANNED INTERVENTIONS: Therapeutic exercises, Therapeutic activity, Neuromuscular re-education, Balance training, Gait training, Patient/Family education, Self Care, Joint mobilization, DME instructions, Dry Needling, Electrical stimulation, Spinal mobilization, Cryotherapy, Moist heat, Taping, Manual therapy, and Re-evaluation   PLAN FOR NEXT SESSION: Progress ROM/strengthening exercises as able/appropriate, review HEP.   Voncille Lo, PT, [employer] [employer] Expert for the A[employer] Adult  04/14/22 12:06 PM Phone: [phone] Fax: [phone]

## 2022-04-12 NOTE — Therapy (Signed)
OUTPATIENT PHYSICAL THERAPY TREATMENT NOTE   Patient Name: Nathaniel Robinson MRN: [id number] DOB:1955-11-17, 67 y.o., male Today's Date: 04/12/2022  PCP: Ladell Pier, MD  REFERRING PROVIDER: Elba Barman, DO   END OF SESSION:   PT End of Session - 04/12/22 0936     Visit Number 3    Number of Visits 13    Date for PT Re-Evaluation 05/23/22    Authorization Type [employer] MCD    Authorization Time Period no auth    Authorization - Visit Number 3    Authorization - Number of Visits 27    PT Start Time 0933    PT Stop Time 1013    PT Time Calculation (min) 40 min    Activity Tolerance Patient tolerated treatment well;No increased pain    Behavior During Therapy WFL for tasks assessed/performed              Past Medical History:  Diagnosis Date   Chronic low back pain 2010    GERD (gastroesophageal reflux disease)    Past Surgical History:  Procedure Laterality Date   COLONOSCOPY N/A 05/15/2015   Procedure: COLONOSCOPY;  Surgeon: Danie Binder, MD;  [facility]: [facility];  Service: Endoscopy;  Laterality: N/A;  145 - moved to 1:30 - office to notify   COLONOSCOPY WITH PROPOFOL N/A 03/30/2021   Procedure: COLONOSCOPY WITH PROPOFOL;  Surgeon: Eloise Harman, DO;  Location: [facility];  Service: Endoscopy;  Laterality: N/A;  10:00 / ASA 2   POLYPECTOMY  03/30/2021   Procedure: POLYPECTOMY;  Surgeon: Eloise Harman, DO;  Location: [facility];  Service: Endoscopy;;   Patient Active Problem List   Diagnosis Date Noted   Aortic atherosclerosis ([place]) 05/20/2021   ED (erectile dysfunction) 01/27/2021   Controlled substance agreement signed 09/17/2020   Lung nodules 05/18/2020   Atherosclerosis of native coronary artery of native heart without angina pectoris 05/18/2020   Elevated PSA 12/26/2018   Flu vaccine need 10/08/2018   Spondylosis without myelopathy or radiculopathy, lumbar region 09/14/2018   Tobacco dependence 06/01/2018   Paresthesia 01/02/2018    Gastroesophageal reflux disease without esophagitis 09/15/2017   Idiopathic peripheral neuropathy 09/15/2017   Chronic lumbar radiculopathy 02/10/2017   Hyperlipidemia 03/14/2016   Lumbar degenerative disc disease 09/11/2015   Chronic pain syndrome 09/11/2015   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   Chronic low back pain     REFERRING DIAG: M25.511,G89.29 (ICD-10-CM) - Chronic right shoulder pain   THERAPY DIAG:  Right shoulder pain, unspecified chronicity  Muscle weakness (generalized)  Stiffness of right shoulder, not elsewhere classified  Rationale for Evaluation and Treatment Rehabilitation  PERTINENT HISTORY: hx TB, multiple MSK issues, neuropathy Denies cardiac hx  PRECAUTIONS: none  SUBJECTIVE:  SUBJECTIVE STATEMENT:  Pt reports 4/10 pain and he is taking medication that helps.  I think I am better with the TPDN last time.   EVAL- Appreciate assistance of video interpreter for session.  Pt states symptoms started 5-6 months ago, no mechanism of injury, gradually came on without any changes in activity. Pt states that five months ago he received an injection for his shoulder that seemed helpful. Continues to have difficulty w/ ADLs and housework. Pt is R hand dominant typically but is having to modify activities due to pain Symptoms sometimes refer to elbow but nothing distally. No numbness/tingling, no fevers, no weight changes. Reports getting an MRI in November.   PAIN: PAIN:  Are you having pain: no Location/description: R shoulder, lateral/superior sometimes referring into upper arm Best-worst over past week: 0-7/10, gradually eases Per eval -  - aggravating factors: reaching, lifting/carrying, housework - Easing factors: injection/medication,  rest    OBJECTIVE: (objective measures completed at initial evaluation unless otherwise dated)   DIAGNOSTIC FINDINGS:  MRI 12/24/21 R shoulder IMPRESSION: 1. Moderate tendinosis of the supraspinatus tendon with a small partial-thickness bursal surface tear anteriorly. 2. Mild tendinosis of the infraspinatus tendon. 3. Mild tendinosis of the subscapularis tendon. 4. Mild tendinosis of the intra-articular portion of the long head of the biceps tendon.   PATIENT SURVEYS:  Quick DASH deferred given time constraints and interpreter services   COGNITION: Overall cognitive status: Within functional limits for tasks assessed                                  SENSATION: Light touch intact B UE   POSTURE: Forward head posture, rounded shoulders w/ R UT elevation   UPPER EXTREMITY ROM:   A/PROM Right eval Left eval LT 04-12-22  Shoulder flexion 142 * 156 150  Shoulder abduction 118 * 160 123  Shoulder internal rotation     t_10  Shoulder external rotation       Elbow flexion       Elbow extension       Wrist flexion       Wrist extension        (Blank rows = not tested) (Key: WFL = within functional limits not formally assessed, * = concordant pain, s = stiffness/stretching sensation, NT = not tested)  Comments: cervical rotation/flexion/extension grossly symmetrical, WNL and painless    UPPER EXTREMITY MMT:   MMT Right eval Left eval  Shoulder flexion 4 * 5  Shoulder abduction 4 * 5  Shoulder extension      Shoulder internal rotation 4 5  Shoulder external rotation 4 5  Elbow flexion 4 4  Elbow extension 4 4  Grip strength      (Blank rows = not tested)  (Key: WFL = within functional limits not formally assessed, * = concordant pain, s = stiffness/stretching sensation, NT = not tested)  Comments:    PALPATION:  Concordant tenderness R infraspinatus/teres, deltoid, biceps. Generalized tightness throughout periscapular musculature but nonpainful              TODAY'S TREATMENT:      [facility] Adult PT Treatment:                                                DATE: 04-12-22 Therapeutic Exercise:  Horizontal Abduction GTB 30 x Passive Shoulder Flexion Walk-Back with Table  1 x 10 Standing Shoulder Posterior Capsule Stretch  2  reps - 30 sec hold Shoulder extension with -BTB 3 sets - 10 reps Shoulder External Rotation and Scapular Retraction GTB 3 sets - 10 reps Standing Shoulder Diagonal Horizontal Abduction 60/120 Degrees with BTB - 1 x daily - 7 x weekly - 3 sets - 10 reps Standing Shoulder Horizontal Abduction with BTB 3 sets - 10 reps Standing Bicep Curls with Resistance  3 sets - 10 reps Seated Serratus Press with Anchored GTB  3 sets - 10 reps MANUAL- STW to shld girdle Shoulder mobs for  IR and inf glide with distraction Trigger Point Dry Needling Treatment: Pre-treatment instruction: Patient instructed on dry needling rationale, procedures, and possible side effects including pain during treatment (achy,cramping feeling), bruising, drop of blood, lightheadedness, nausea, sweating. Patient Consent Given: Yes  through interpreter Education handout provided: Yes Muscles treated: RT levator and triceps infraspinatus and biceps  Needle size and number: .30x41m x 3 Electrical stimulation performed: No Parameters: N/A Treatment response/outcome: Twitch response elicited and Palpable decrease in muscle tension Post-treatment instructions: Patient instructed to expect possible mild to moderate muscle soreness later today and/or tomorrow. Patient instructed in methods to reduce muscle soreness and to continue prescribed HEP. If patient was dry needled over the lung field, patient was instructed on signs and symptoms of pneumothorax and, however unlikely, to see immediate medical attention should they occur. Patient was also educated on signs and symptoms of infection and to seek medical attention should they occur. Patient verbalized understanding of  these instructions and education.   Time/Interpreter needed to explain TPDN, benefits and how to correctly perform exercises     [facility]                                                [facility]: [facility] Therapeutic Exercise: Passive Shoulder Flexion Walk-Back with Table  3 sets - 10 reps required VC through interpreter and TC for all exercises Standing Shoulder Posterior Capsule Stretch 3 reps - 30 sec hold Shoulder External Rotation and Scapular Retraction with Resistance  3 sets - 10 reps Standing Shoulder Diagonal Horizontal Abduction 60/120 Degrees with Resistance  3 sets - 10 reps Shoulder Abduction - Thumbs Up  3 sets - 10 reps Standing Bicep Curls with Resistance  Manual Therapy: STW to shld girdle Shoulder mobs for  IR and inf glide with distraction Trigger Point Dry Needling Treatment: Pre-treatment instruction: Patient instructed on dry needling rationale, procedures, and possible side effects including pain during treatment (achy,cramping feeling), bruising, drop of blood, lightheadedness, nausea, sweating. Patient Consent Given: Yes  through interpreter Education handout provided: Yes Muscles treated: RT supraspinatus and infraspinatus, and biceps  Needle size and number: .30x523mx 3 Electrical stimulation performed: No Parameters: N/A Treatment response/outcome: Twitch response elicited and Palpable decrease in muscle tension Post-treatment instructions: Patient instructed to expect possible mild to moderate muscle soreness later today and/or tomorrow. Patient instructed in methods to reduce muscle soreness and to continue prescribed HEP. If patient was dry needled over the lung field, patient was instructed on signs and symptoms of pneumothorax and, however unlikely, to see immediate medical attention should they occur. Patient was also educated on signs and symptoms of infection and to seek medical attention should they occur.  Patient verbalized understanding of  these instructions and education.   Time/Interpreter needed to explain TPDN, benefits and how to correctly perform exercises                                      [facility] Adult PT Treatment:                                                DATE: 03/28/22 Therapeutic Exercise: Cross body horizontal adduction 30sec hold cues for appropriate posture and HEP Shoulder flexion counter walkbacks x10  Verbal HEP review, teach back     PATIENT EDUCATION: Education details: Pt education on PT impairments, prognosis, and POC. Informed consent. Rationale for interventions, safe/appropriate HEP performance Person educated: Patient Education method: Explanation, Demonstration, Tactile cues, Verbal cues Education comprehension: verbalized understanding, returned demonstration, verbal cues required, tactile cues required, and needs further education     HOME EXERCISE PROGRAM:   Access Code: [email or web address] URL: [email or web address] Date: 04/12/2022 Prepared by: Voncille Lo  Exercises - Passive Shoulder Flexion Walk-Back with Table  - 1 x daily - 7 x weekly - 3 sets - 10 reps - Standing Shoulder Posterior Capsule Stretch  - 1 x daily - 7 x weekly - 1 sets - 3 reps - 30 sec hold - Shoulder extension with resistance - Neutral  - 1 x daily - 7 x weekly - 3 sets - 10 reps - Shoulder External Rotation and Scapular Retraction with Resistance  - 1 x daily - 7 x weekly - 3 sets - 10 reps - Standing Shoulder Diagonal Horizontal Abduction 60/120 Degrees with Resistance  - 1 x daily - 7 x weekly - 3 sets - 10 reps - Standing Shoulder Horizontal Abduction with Resistance  - 1 x daily - 7 x weekly - 3 sets - 10 reps - Standing Bicep Curls with Resistance  - 1 x daily - 7 x weekly - 3 sets - 10 reps - Seated Serratus Press with Anchored Resistance  - 1 x daily - 7 x weekly - 3 sets - 10 reps ASSESSMENT:   CLINICAL IMPRESSION: Pt enters clinic with 4/10 pain and states through interpreter that his meds help  and TPDN.  He consents to [facility] through interpreter and states after TPDN he has 4/10 after RX. Pt with increased AROM after RX but no change in pain as reported by pt.  Pt with flat affect.  Pt complains of global pain after TPDN and not specifying area.   Pt  was able to do  the exercises with interpreter and return demo with added exercise to HEP. Will continue toward completion of all goals.  Pt departs today's session in no acute distress, all voiced questions/concerns addressed appropriately from PT perspective.   EVAL- Patient is a 67 y.o. gentleman who was seen today for physical therapy evaluation and treatment for R shoulder pain ongoing for past few months which is affecting his tolerance to typical daily activities, R hand dominant. On exam pt demos concordant tenderness of GH musculature, limitations in [place] mobility and strength as described above. Teach back method with HEP, pt tolerates well without adverse event. Recommend skilled PT to address these deficits and maximize functional independence. Pt departs today's session in no acute distress, all voiced questions/concerns  addressed appropriately from PT perspective.     OBJECTIVE IMPAIRMENTS: decreased activity tolerance, decreased endurance, decreased mobility, decreased ROM, decreased strength, hypomobility, increased muscle spasms, impaired UE functional use, improper body mechanics, postural dysfunction, and pain.    ACTIVITY LIMITATIONS: carrying, lifting, and reach over head   PARTICIPATION LIMITATIONS: meal prep, cleaning, and laundry   PERSONAL FACTORS: Age are also affecting patient's functional outcome.    REHAB POTENTIAL: Good   CLINICAL DECISION MAKING: Stable/uncomplicated   EVALUATION COMPLEXITY: Low     GOALS: Goals reviewed with patient? No given time constraints and language barrier   SHORT TERM GOALS: Target date: 04/18/2022 Pt will demonstrate appropriate understanding and performance of initially prescribed  HEP in order to facilitate improved independence with management of symptoms.  Baseline: HEP provided on eval Goal status: INITIAL      LONG TERM GOALS: Target date: 05/09/2022 Pt will report 0-4/10 pain on average w/ daily activities in order to facilitate improved tolerance to ADLs.  Baseline: 0-7/10 pain with typical daily activities Goal status: INITIAL   2.  Pt will demonstrate at least 145 degrees of active shoulder elevation on RUE in order to demonstrate improved tolerance to upper body dressing and housework. Baseline: see ROM chart above Goal status: INITIAL   3.  Pt will demonstrate at least 4+/5 shoulder ER/IR MMT for improved symmetry of UE strength and improved tolerance to functional movements.  Baseline: see MMT chart above Goal status: INITIAL   4. Pt will report/demonstrate ability to perform upper body with less than 2 point increase in pain on NPS in order to indicative improved tolerance/independence with ADLs.            Baseline: up to 7/10 pain with daily activities            Goal status: INITIAL    5. Pt will demonstrate independence w/ final prescribed HEP in order to facilitate improved pt self efficacy with management of symptoms.             Baseline: HEP prescribed on eval            Goal status: INITIAL   PLAN:   PT FREQUENCY: 2x/week   PT DURATION: 6 weeks   PLANNED INTERVENTIONS: Therapeutic exercises, Therapeutic activity, Neuromuscular re-education, Balance training, Gait training, Patient/Family education, Self Care, Joint mobilization, DME instructions, Dry Needling, Electrical stimulation, Spinal mobilization, Cryotherapy, Moist heat, Taping, Manual therapy, and Re-evaluation   PLAN FOR NEXT SESSION: Progress ROM/strengthening exercises as able/appropriate, review HEP.   Voncille Lo, PT, [place] Certified Exercise Expert for the Aging Adult  04/12/22 11:50 AM Phone: [phone] Fax: [phone]

## 2022-04-14 ENCOUNTER — Encounter: Payer: Self-pay | Admitting: Physical Therapy

## 2022-04-14 ENCOUNTER — Ambulatory Visit: Payer: Medicaid Other | Admitting: Physical Therapy

## 2022-04-14 DIAGNOSIS — M25511 Pain in right shoulder: Secondary | ICD-10-CM | POA: Diagnosis not present

## 2022-04-14 DIAGNOSIS — M25611 Stiffness of right shoulder, not elsewhere classified: Secondary | ICD-10-CM

## 2022-04-14 DIAGNOSIS — M6281 Muscle weakness (generalized): Secondary | ICD-10-CM

## 2022-04-18 ENCOUNTER — Ambulatory Visit: Payer: Medicaid Other | Attending: Sports Medicine | Admitting: Physical Therapy

## 2022-04-18 ENCOUNTER — Encounter: Payer: Self-pay | Admitting: Physical Therapy

## 2022-04-18 DIAGNOSIS — M25611 Stiffness of right shoulder, not elsewhere classified: Secondary | ICD-10-CM | POA: Insufficient documentation

## 2022-04-18 DIAGNOSIS — M25511 Pain in right shoulder: Secondary | ICD-10-CM | POA: Diagnosis not present

## 2022-04-18 DIAGNOSIS — M6281 Muscle weakness (generalized): Secondary | ICD-10-CM | POA: Diagnosis present

## 2022-04-18 NOTE — Therapy (Signed)
OUTPATIENT PHYSICAL THERAPY TREATMENT NOTE   Patient Name: Nathaniel Robinson MRN: [id number] DOB:August 06, 1955, 67 y.o., male Today's Date: 04/18/2022  PCP: Ladell Pier, MD  REFERRING PROVIDER: Elba Barman, DO   END OF SESSION:   PT End of Session - 04/18/22 0920     Visit Number 5    Number of Visits 13    Date for PT Re-Evaluation 05/23/22    Authorization Type [employer] MCD    Authorization Time Period no auth    Authorization - Visit Number 5    Authorization - Number of Visits 27    PT Start Time 0920    PT Stop Time [id number]    PT Time Calculation (min) 48 min    Activity Tolerance Patient tolerated treatment well;No increased pain    Behavior During Therapy WFL for tasks assessed/performed                Past Medical History:  Diagnosis Date   Chronic low back pain 2010    GERD (gastroesophageal reflux disease)    Past Surgical History:  Procedure Laterality Date   COLONOSCOPY N/A 05/15/2015   Procedure: COLONOSCOPY;  Surgeon: Danie Binder, MD;  [facility]: [facility];  Service: Endoscopy;  Laterality: N/A;  145 - moved to 1:30 - office to notify   COLONOSCOPY WITH PROPOFOL N/A 03/30/2021   Procedure: COLONOSCOPY WITH PROPOFOL;  Surgeon: Eloise Harman, DO;  Location: [facility];  Service: Endoscopy;  Laterality: N/A;  10:00 / ASA 2   POLYPECTOMY  03/30/2021   Procedure: POLYPECTOMY;  Surgeon: Eloise Harman, DO;  Location: [facility];  Service: Endoscopy;;   Patient Active Problem List   Diagnosis Date Noted   Aortic atherosclerosis ([place]) 05/20/2021   ED (erectile dysfunction) 01/27/2021   Controlled substance agreement signed 09/17/2020   Lung nodules 05/18/2020   Atherosclerosis of native coronary artery of native heart without angina pectoris 05/18/2020   Elevated PSA 12/26/2018   Flu vaccine need 10/08/2018   Spondylosis without myelopathy or radiculopathy, lumbar region 09/14/2018   Tobacco dependence 06/01/2018   Paresthesia 01/02/2018    Gastroesophageal reflux disease without esophagitis 09/15/2017   Idiopathic peripheral neuropathy 09/15/2017   Chronic lumbar radiculopathy 02/10/2017   Hyperlipidemia 03/14/2016   Lumbar degenerative disc disease 09/11/2015   Chronic pain syndrome 09/11/2015   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   Chronic low back pain     REFERRING DIAG: M25.511,G89.29 (ICD-10-CM) - Chronic right shoulder pain   THERAPY DIAG:  Right shoulder pain, unspecified chronicity  Muscle weakness (generalized)  Stiffness of right shoulder, not elsewhere classified  Rationale for Evaluation and Treatment Rehabilitation  PERTINENT HISTORY: hx TB, multiple MSK issues, neuropathy Denies cardiac hx  PRECAUTIONS: none  SUBJECTIVE:  SUBJECTIVE STATEMENT: " The pain is still about the same."  PAIN: PAIN:  Are you having pain: 3-4/10 Location/description: R shoulder, lateral/superior sometimes referring into upper arm Best-worst over past week: 0-7/10, gradually eases Per eval -  - aggravating factors: reaching, lifting/carrying, housework - Easing factors: injection/medication, rest    OBJECTIVE: (objective measures completed at initial evaluation unless otherwise dated)   DIAGNOSTIC FINDINGS:  MRI 12/24/21 R shoulder IMPRESSION: 1. Moderate tendinosis of the supraspinatus tendon with a small partial-thickness bursal surface tear anteriorly. 2. Mild tendinosis of the infraspinatus tendon. 3. Mild tendinosis of the subscapularis tendon. 4. Mild tendinosis of the intra-articular portion of the long head of the biceps tendon.   PATIENT SURVEYS:  Quick DASH deferred given time constraints and interpreter services   COGNITION: Overall cognitive status: Within functional limits for tasks  assessed                                  SENSATION: Light touch intact B UE   POSTURE: Forward head posture, rounded shoulders w/ R UT elevation   UPPER EXTREMITY ROM:   A/PROM Right eval Left eval LT 04-12-22  Shoulder flexion 142 * 156 150  Shoulder abduction 118 * 160 123  Shoulder internal rotation     t_10  Shoulder external rotation       Elbow flexion       Elbow extension       Wrist flexion       Wrist extension        (Blank rows = not tested) (Key: WFL = within functional limits not formally assessed, * = concordant pain, s = stiffness/stretching sensation, NT = not tested)  Comments: cervical rotation/flexion/extension grossly symmetrical, WNL and painless    UPPER EXTREMITY MMT:   MMT Right eval Left eval  Shoulder flexion 4 * 5  Shoulder abduction 4 * 5  Shoulder extension      Shoulder internal rotation 4 5  Shoulder external rotation 4 5  Elbow flexion 4 4  Elbow extension 4 4  Grip strength      (Blank rows = not tested)  (Key: WFL = within functional limits not formally assessed, * = concordant pain, s = stiffness/stretching sensation, NT = not tested)  Comments:    PALPATION:  Concordant tenderness R infraspinatus/teres, deltoid, biceps. Generalized tightness throughout periscapular musculature but nonpainful             TODAY'S TREATMENT:    [facility] Adult PT Treatment:                                                DATE: 04/18/2022 Therapeutic Exercise: R upper trap/ levator scapulae stretch 2 x 30 sec UBE L5 x 4 min (FWD/BWD x 2 min) Lower trap strengthening with bil elbows propped on bolster 2 x 12 with red theraband Scaption 2 x 15 R UE 1# - tactile cues for proper form Scapular retraction and ER 2 x 12 with RTB Rhythmic stabilization  combined sustained  scapular protraction 5 x 30 sec of perturbations at various angles Manual Therapy: MTPR along the middle deltoid, upper trap/ levator scapulae, middle deltoid, R subclavius IASTM along the  R upper trap / levator scapulae, middle deltoid Scapular upward assist combined with active flexion ( reduced  pain slightly) Distal clavicle posterior mobs    [facility] Adult PT Treatment:                                                DATE: 04-14-22 Therapeutic Exercise: Horizontal Abduction GTB 30 x LT Sidelying  RT ER with towel roll using 6 # DB LT sidelying RT abduction with 8 # DB LT sidelying RT flexion with 8 # DB RT OH press with 10 # DB Shoulder extension with -BTB 3 sets - 10 reps Standing Bicep Curls with Resistance  3 sets - 10 reps 10 # Seated bend over row with 10 # 3 x 10 Manual Therapy: STW to shld girdle over  RT rib mob PA 5,6,7 1st rib Rt mob with concordant pain to shld but improved  Shoulder mobs for  IR and inf glide with distraction   [facility] Adult PT Treatment:                                                DATE: 04-12-22 Therapeutic Exercise: Horizontal Abduction GTB 30 x Passive Shoulder Flexion Walk-Back with Table  1 x 10 Standing Shoulder Posterior Capsule Stretch  2  reps - 30 sec hold Shoulder extension with -BTB 3 sets - 10 reps Shoulder External Rotation and Scapular Retraction GTB 3 sets - 10 reps Standing Shoulder Diagonal Horizontal Abduction 60/120 Degrees with BTB - 1 x daily - 7 x weekly - 3 sets - 10 reps Standing Shoulder Horizontal Abduction with BTB 3 sets - 10 reps Standing Bicep Curls with Resistance  3 sets - 10 reps Seated Serratus Press with Anchored GTB  3 sets - 10 reps MANUAL- STW to shld girdle Shoulder mobs for  IR and inf glide with distraction Trigger Point Dry Needling Treatment: Pre-treatment instruction: Patient instructed on dry needling rationale, procedures, and possible side effects including pain during treatment (achy,cramping feeling), bruising, drop of blood, lightheadedness, nausea, sweating. Patient Consent Given: Yes  through interpreter Education handout provided: Yes Muscles treated: RT levator and triceps  infraspinatus and biceps  Needle size and number: .30x12m x 3 Electrical stimulation performed: No Parameters: N/A Treatment response/outcome: Twitch response elicited and Palpable decrease in muscle tension Post-treatment instructions: Patient instructed to expect possible mild to moderate muscle soreness later today and/or tomorrow. Patient instructed in methods to reduce muscle soreness and to continue prescribed HEP. If patient was dry needled over the lung field, patient was instructed on signs and symptoms of pneumothorax and, however unlikely, to see immediate medical attention should they occur. Patient was also educated on signs and symptoms of infection and to seek medical attention should they occur. Patient verbalized understanding of these instructions and education.   Time/Interpreter needed to explain TPDN, benefits and how to correctly perform exercises        PATIENT EDUCATION: Education details: Pt education on PT impairments, prognosis, and POC. Informed consent. Rationale for interventions, safe/appropriate HEP performance Person educated: Patient Education method: Explanation, Demonstration, Tactile cues, Verbal cues Education comprehension: verbalized understanding, returned demonstration, verbal cues required, tactile cues required, and needs further education     HOME EXERCISE PROGRAM:  Access Code: [email or web address] URL: [email or web address] Date: 04/14/2022 Prepared by: LVoncille Lo Exercises -  Passive Shoulder Flexion Walk-Back with Table  - 1 x daily - 7 x weekly - 3 sets - 10 reps - Standing Shoulder Posterior Capsule Stretch  - 1 x daily - 7 x weekly - 1 sets - 3 reps - 30 sec hold - Shoulder extension with resistance - Neutral  - 1 x daily - 7 x weekly - 3 sets - 10 reps - Shoulder External Rotation and Scapular Retraction with Resistance  - 1 x daily - 7 x weekly - 3 sets - 10 reps - Standing Shoulder Diagonal Horizontal Abduction 60/120  Degrees with Resistance  - 1 x daily - 7 x weekly - 3 sets - 10 reps - Standing Shoulder Horizontal Abduction with Resistance  - 1 x daily - 7 x weekly - 3 sets - 10 reps - Standing Bicep Curls with Resistance  - 1 x daily - 7 x weekly - 3 sets - 10 reps - Seated Serratus Press with Anchored Resistance  - 1 x daily - 7 x weekly - 3 sets - 10 reps - Seated single arm Bent over shld Row with DB or bag  - 1 x daily - 7 x weekly - 3 sets - 10 reps   ASSESSMENT:   CLINICAL IMPRESSION: Pt arrives to session reporting pain at 3-4/10 today. He reported minimal benefit with TPDN and declined that treatment today. Opted to perform MTPR and IASTM techniques. Continued working on [employer] strengthening/ stability.  He responded well with scapular mobs with movement but had some challenges with translation as to benefit/ pain. He is able to achieve functional shoulder ROM but notes pain mostly at end range flexion/ abduction pointing at the [facility] joint. End of session he reported continued pain but unable to report intensity.    OBJECTIVE IMPAIRMENTS: decreased activity tolerance, decreased endurance, decreased mobility, decreased ROM, decreased strength, hypomobility, increased muscle spasms, impaired UE functional use, improper body mechanics, postural dysfunction, and pain.    ACTIVITY LIMITATIONS: carrying, lifting, and reach over head   PARTICIPATION LIMITATIONS: meal prep, cleaning, and laundry   PERSONAL FACTORS: Age are also affecting patient's functional outcome.    REHAB POTENTIAL: Good   CLINICAL DECISION MAKING: Stable/uncomplicated   EVALUATION COMPLEXITY: Low     GOALS: Goals reviewed with patient? No given time constraints and language barrier   SHORT TERM GOALS: Target date: 04/18/2022 Pt will demonstrate appropriate understanding and performance of initially prescribed HEP in order to facilitate improved independence with management of symptoms.  Baseline: HEP provided on  eval Goal status: INITIAL      LONG TERM GOALS: Target date: 05/09/2022 Pt will report 0-4/10 pain on average w/ daily activities in order to facilitate improved tolerance to ADLs.  Baseline: 0-7/10 pain with typical daily activities Goal status: INITIAL   2.  Pt will demonstrate at least 145 degrees of active shoulder elevation on RUE in order to demonstrate improved tolerance to upper body dressing and housework. Baseline: see ROM chart above Goal status: INITIAL   3.  Pt will demonstrate at least 4+/5 shoulder ER/IR MMT for improved symmetry of UE strength and improved tolerance to functional movements.  Baseline: see MMT chart above Goal status: INITIAL   4. Pt will report/demonstrate ability to perform upper body with less than 2 point increase in pain on NPS in order to indicative improved tolerance/independence with ADLs.            Baseline: up to 7/10 pain with daily activities  Goal status: INITIAL    5. Pt will demonstrate independence w/ final prescribed HEP in order to facilitate improved pt self efficacy with management of symptoms.             Baseline: HEP prescribed on eval            Goal status: INITIAL   PLAN:   PT FREQUENCY: 2x/week   PT DURATION: 6 weeks   PLANNED INTERVENTIONS: Therapeutic exercises, Therapeutic activity, Neuromuscular re-education, Balance training, Gait training, Patient/Family education, Self Care, Joint mobilization, DME instructions, Dry Needling, Electrical stimulation, Spinal mobilization, Cryotherapy, Moist heat, Taping, Manual therapy, and Re-evaluation   PLAN FOR NEXT SESSION: Progress ROM/strengthening exercises as able/appropriate, review HEP.  assess AC joint and scapular mobility in relation to GHJ ROM. Scapular stability.   Taijuan Serviss PT, DPT, LAT, ATC  04/18/22  10:04 AM

## 2022-04-19 NOTE — Therapy (Addendum)
OUTPATIENT PHYSICAL THERAPY TREATMENT NOTE + NO VISIT DISCHARGE (see below)   Patient Name: Nathaniel Robinson MRN: [id number] DOB:April 20, 1955, 67 y.o., male Today's Date: 04/20/2022  PCP: Ladell Pier, MD  REFERRING PROVIDER: Elba Barman, DO   END OF SESSION:   PT End of Session - 04/20/22 0929     Visit Number 6    Number of Visits 13    Date for PT Re-Evaluation 05/23/22    Authorization Type [employer] MCD    Authorization Time Period no auth    Authorization - Visit Number 6    Authorization - Number of Visits 27    PT Start Time 0930    PT Stop Time [id number]    PT Time Calculation (min) 44 min    Activity Tolerance Patient tolerated treatment well;No increased pain    Behavior During Therapy WFL for tasks assessed/performed                 Past Medical History:  Diagnosis Date   Chronic low back pain 2010    GERD (gastroesophageal reflux disease)    Past Surgical History:  Procedure Laterality Date   COLONOSCOPY N/A 05/15/2015   Procedure: COLONOSCOPY;  Surgeon: Danie Binder, MD;  [facility];  Service: Endoscopy;  Laterality: N/A;  145 - moved to 1:30 - office to notify   COLONOSCOPY WITH PROPOFOL N/A 03/30/2021   Procedure: COLONOSCOPY WITH PROPOFOL;  Surgeon: Eloise Harman, DO;  [facility]: [facility];  Service: Endoscopy;  Laterality: N/A;  10:00 / ASA 2   POLYPECTOMY  03/30/2021   Procedure: POLYPECTOMY;  Surgeon: Eloise Harman, DO;  [facility]: [facility];  Service: Endoscopy;;   Patient Active Problem List   Diagnosis Date Noted   Aortic atherosclerosis ([place]) 05/20/2021   ED (erectile dysfunction) 01/27/2021   Controlled substance agreement signed 09/17/2020   Lung nodules 05/18/2020   Atherosclerosis of native coronary artery of native heart without angina pectoris 05/18/2020   Elevated PSA 12/26/2018   Flu vaccine need 10/08/2018   Spondylosis without myelopathy or radiculopathy, lumbar region 09/14/2018   Tobacco dependence  06/01/2018   Paresthesia 01/02/2018   Gastroesophageal reflux disease without esophagitis 09/15/2017   Idiopathic peripheral neuropathy 09/15/2017   Chronic lumbar radiculopathy 02/10/2017   Hyperlipidemia 03/14/2016   Lumbar degenerative disc disease 09/11/2015   Chronic pain syndrome 09/11/2015   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   Chronic low back pain     REFERRING DIAG: M25.511,G89.29 (ICD-10-CM) - Chronic right shoulder pain   THERAPY DIAG:  Right shoulder pain, unspecified chronicity  Muscle weakness (generalized)  Stiffness of right shoulder, not elsewhere classified  Rationale for Evaluation and Treatment Rehabilitation  PERTINENT HISTORY: hx TB, multiple MSK issues, neuropathy Denies cardiac hx  PRECAUTIONS: none  SUBJECTIVE:  SUBJECTIVE STATEMENT:  Appreciate assistance of video interpreter services throughout session. Pt states his shoulder is feeling a bit better since starting therapy, about a 2/10 today. States he doesn't feel the need to take pain meds most of the time anymore and feels stronger  PAIN:  Are you having pain: 2/10 Location/description: R shoulder, lateral/superior sometimes referring into upper arm Best-worst over past week: 0-7/10, gradually eases Per eval -  - aggravating factors: reaching, lifting/carrying, housework - Easing factors: injection/medication, rest    OBJECTIVE: (objective measures completed at initial evaluation unless otherwise dated)   DIAGNOSTIC FINDINGS:  MRI 12/24/21 R shoulder IMPRESSION: 1. Moderate tendinosis of the supraspinatus tendon with a small partial-thickness bursal surface tear anteriorly. 2. Mild tendinosis of the infraspinatus tendon. 3. Mild tendinosis of the subscapularis tendon. 4. Mild  tendinosis of the intra-articular portion of the long head of the biceps tendon.   PATIENT SURVEYS:  Quick DASH deferred given time constraints and interpreter services   COGNITION: Overall cognitive status: Within functional limits for tasks assessed                                  SENSATION: Light touch intact B UE   POSTURE: Forward head posture, rounded shoulders w/ R UT elevation   UPPER EXTREMITY ROM:   A/PROM Right eval Left eval LT 04-12-22 R/L AROM 04/20/22  Shoulder flexion 142 * 156 150 145 * / 148  Shoulder abduction 118 * 160 123 120 * / 120   Shoulder internal rotation     t_10   Shoulder external rotation        Elbow flexion        Elbow extension        Wrist flexion        Wrist extension         (Blank rows = not tested) (Key: WFL = within functional limits not formally assessed, * = concordant pain, s = stiffness/stretching sensation, NT = not tested)  Comments: cervical rotation/flexion/extension grossly symmetrical, WNL and painless    UPPER EXTREMITY MMT:   MMT Right eval Left eval  Shoulder flexion 4 * 5  Shoulder abduction 4 * 5  Shoulder extension      Shoulder internal rotation 4 5  Shoulder external rotation 4 5  Elbow flexion 4 4  Elbow extension 4 4  Grip strength      (Blank rows = not tested)  (Key: WFL = within functional limits not formally assessed, * = concordant pain, s = stiffness/stretching sensation, NT = not tested)  Comments:    PALPATION:  Concordant tenderness R infraspinatus/teres, deltoid, biceps. Generalized tightness throughout periscapular musculature but nonpainful             TODAY'S TREATMENT:   [facility] Adult PT Treatment:                                                DATE: 04/20/22 Therapeutic Exercise: UBE 68min fwd/back during subjective UT stretch B 3x30sec cues for appropriate ROM and form, breath control  B scaption BW x10, 1# B x12 both sets within tolerable range (>90 deg) B scaption x10 2# <90deg cues for  posture  B bicep curl 2# x12, 5# B x10 cues for pacing Shoulder flexion walk back at  counter 2x10  RTB B ER + scapular retraction 2x15 cues for form   Manual Therapy: Seated: soft tissue mobilization R deltoid, upper trap, levator scap , superior biceps   [facility] Adult PT Treatment:                                                DATE: 04/18/2022 Therapeutic Exercise: R upper trap/ levator scapulae stretch 2 x 30 sec UBE L5 x 4 min (FWD/BWD x 2 min) Lower trap strengthening with bil elbows propped on bolster 2 x 12 with red theraband Scaption 2 x 15 R UE 1# - tactile cues for proper form Scapular retraction and ER 2 x 12 with RTB Rhythmic stabilization  combined sustained  scapular protraction 5 x 30 sec of perturbations at various angles Manual Therapy: MTPR along the middle deltoid, upper trap/ levator scapulae, middle deltoid, R subclavius IASTM along the R upper trap / levator scapulae, middle deltoid Scapular upward assist combined with active flexion ( reduced pain slightly) Distal clavicle posterior mobs    [facility] Adult PT Treatment:                                                DATE: 04-14-22 Therapeutic Exercise: Horizontal Abduction GTB 30 x LT Sidelying  RT ER with towel roll using 6 # DB LT sidelying RT abduction with 8 # DB LT sidelying RT flexion with 8 # DB RT OH press with 10 # DB Shoulder extension with -BTB 3 sets - 10 reps Standing Bicep Curls with Resistance  3 sets - 10 reps 10 # Seated bend over row with 10 # 3 x 10 Manual Therapy: STW to shld girdle over  RT rib mob PA 5,6,7 1st rib Rt mob with concordant pain to shld but improved  Shoulder mobs for  IR and inf glide with distraction    PATIENT EDUCATION: Education details: rationale for interventions, PT POC Person educated: Patient Education method: Explanation, Demonstration, Tactile cues, Verbal cues Education comprehension: verbalized understanding, returned demonstration, verbal cues required, tactile  cues required, and needs further education     HOME EXERCISE PROGRAM: Access Code: [email or web address] URL: [email or web address] Date: 04/14/2022 Prepared by: Voncille Lo  Exercises - Passive Shoulder Flexion Walk-Back with Table  - 1 x daily - 7 x weekly - 3 sets - 10 reps - Standing Shoulder Posterior Capsule Stretch  - 1 x daily - 7 x weekly - 1 sets - 3 reps - 30 sec hold - Shoulder extension with resistance - Neutral  - 1 x daily - 7 x weekly - 3 sets - 10 reps - Shoulder External Rotation and Scapular Retraction with Resistance  - 1 x daily - 7 x weekly - 3 sets - 10 reps - Standing Shoulder Diagonal Horizontal Abduction 60/120 Degrees with Resistance  - 1 x daily - 7 x weekly - 3 sets - 10 reps - Standing Shoulder Horizontal Abduction with Resistance  - 1 x daily - 7 x weekly - 3 sets - 10 reps - Standing Bicep Curls with Resistance  - 1 x daily - 7 x weekly - 3 sets - 10 reps - Seated Serratus Press with Anchored Resistance  - 1 x daily -  7 x weekly - 3 sets - 10 reps - Seated single arm Bent over shld Row with DB or bag  - 1 x daily - 7 x weekly - 3 sets - 10 reps   ASSESSMENT:   CLINICAL IMPRESSION: Pt arrives w/ 2/10 pain at present, states he feels he is improved since starting therapy and not needing as much of his pain meds. Today focusing on improving volume/resistance w/ familiar program emphasizing periscapular/GH mobility/endurance. Pt does describe some increase in lateral/anterior shoulder as activity goes on, difficult to ascertain if more so fatigue or more so pain based on pt description, but did end session with manual as described above which pt reports improves symptoms. No adverse events, reports 0/10 pain on departure. At end of session pt states he would like to hold on PT for a couple of weeks as he is feeling better, would like to see how he feels with a break. This is respected, discussed HEP performance and monitoring symptoms. Pt departs today's  session in no acute distress, all voiced questions/concerns addressed appropriately from PT perspective.     OBJECTIVE IMPAIRMENTS: decreased activity tolerance, decreased endurance, decreased mobility, decreased ROM, decreased strength, hypomobility, increased muscle spasms, impaired UE functional use, improper body mechanics, postural dysfunction, and pain.    ACTIVITY LIMITATIONS: carrying, lifting, and reach over head   PARTICIPATION LIMITATIONS: meal prep, cleaning, and laundry   PERSONAL FACTORS: Age are also affecting patient's functional outcome.    REHAB POTENTIAL: Good   CLINICAL DECISION MAKING: Stable/uncomplicated   EVALUATION COMPLEXITY: Low     GOALS: Goals reviewed with patient? No given time constraints and language barrier   SHORT TERM GOALS: Target date: 04/18/2022 Pt will demonstrate appropriate understanding and performance of initially prescribed HEP in order to facilitate improved independence with management of symptoms.  Baseline: HEP provided on eval 04/20/22: reports good compliance Goal status: MET     LONG TERM GOALS: Target date: 05/09/2022 Pt will report 0-4/10 pain on average w/ daily activities in order to facilitate improved tolerance to ADLs.  Baseline: 0-7/10 pain with typical daily activities Goal status: INITIAL   2.  Pt will demonstrate at least 145 degrees of active shoulder elevation on RUE in order to demonstrate improved tolerance to upper body dressing and housework. Baseline: see ROM chart above Goal status: INITIAL   3.  Pt will demonstrate at least 4+/5 shoulder ER/IR MMT for improved symmetry of UE strength and improved tolerance to functional movements.  Baseline: see MMT chart above Goal status: INITIAL   4. Pt will report/demonstrate ability to perform upper body with less than 2 point increase in pain on NPS in order to indicative improved tolerance/independence with ADLs.            Baseline: up to 7/10 pain with daily  activities            Goal status: INITIAL    5. Pt will demonstrate independence w/ final prescribed HEP in order to facilitate improved pt self efficacy with management of symptoms.             Baseline: HEP prescribed on eval            Goal status: INITIAL   PLAN:   PT FREQUENCY: 2x/week   PT DURATION: 6 weeks   PLANNED INTERVENTIONS: Therapeutic exercises, Therapeutic activity, Neuromuscular re-education, Balance training, Gait training, Patient/Family education, Self Care, Joint mobilization, DME instructions, Dry Needling, Electrical stimulation, Spinal mobilization, Cryotherapy, Moist heat, Taping, Manual  therapy, and Re-evaluation   PLAN FOR NEXT SESSION: Assess pt response to break in therapy - consider d/c vs extension of plan of care   Leeroy Cha PT, DPT 04/20/2022 12:51 PM    Discharge addendum:     PHYSICAL THERAPY DISCHARGE SUMMARY  Visits from Start of Care: 6  Current functional level related to goals / functional outcomes: Unknown - see above for most recent assessment   Remaining deficits: Unable to be assessed - see above for most recent   Education / Equipment: Unable to be assessed   Patient agrees to discharge. Patient goals were  unable to be assessed . Patient is being discharged due to not returning since the last visit, requesting discharge per appt cancellation notes.   Leeroy Cha PT, DPT 05/19/2022 3:17 PM

## 2022-04-20 ENCOUNTER — Encounter: Payer: Self-pay | Admitting: Physical Therapy

## 2022-04-20 ENCOUNTER — Ambulatory Visit: Payer: Medicaid Other | Admitting: Physical Therapy

## 2022-04-20 DIAGNOSIS — M6281 Muscle weakness (generalized): Secondary | ICD-10-CM

## 2022-04-20 DIAGNOSIS — M25511 Pain in right shoulder: Secondary | ICD-10-CM

## 2022-04-20 DIAGNOSIS — M25611 Stiffness of right shoulder, not elsewhere classified: Secondary | ICD-10-CM

## 2022-05-16 ENCOUNTER — Ambulatory Visit: Payer: Medicaid Other | Admitting: Physical Therapy

## 2022-05-24 ENCOUNTER — Ambulatory Visit (INDEPENDENT_AMBULATORY_CARE_PROVIDER_SITE_OTHER): Payer: Medicaid Other | Admitting: Sports Medicine

## 2022-05-24 ENCOUNTER — Encounter: Payer: Self-pay | Admitting: Sports Medicine

## 2022-05-24 ENCOUNTER — Other Ambulatory Visit: Payer: Self-pay | Admitting: Internal Medicine

## 2022-05-24 DIAGNOSIS — G8929 Other chronic pain: Secondary | ICD-10-CM | POA: Diagnosis not present

## 2022-05-24 DIAGNOSIS — M25511 Pain in right shoulder: Secondary | ICD-10-CM

## 2022-05-24 DIAGNOSIS — M12811 Other specific arthropathies, not elsewhere classified, right shoulder: Secondary | ICD-10-CM | POA: Diagnosis not present

## 2022-05-24 DIAGNOSIS — E785 Hyperlipidemia, unspecified: Secondary | ICD-10-CM

## 2022-05-24 MED ORDER — CELECOXIB 100 MG PO CAPS: 100.0000 mg | ORAL_CAPSULE | Freq: Two times a day (BID) | ORAL | 1 refills | Status: AC

## 2022-05-24 NOTE — Progress Notes (Signed)
Nathaniel Robinson - 67 y.o. male MRN [id number]  Date of birth: [birth year]  Office Visit Note: Visit Date: 05/24/2022 PCP: Marcine Matar, MD Referred by: Marcine Matar, MD  Subjective: No chief complaint on file.  HPI: Nathaniel Robinson is a pleasant 67 y.o. male who presents today for follow-up of right shoulder pain.  Was recently discharged from physical therapy as his shoulder has been doing better. He had a subacromial joint injection on 09/29/2021, did have an ultrasound-guided glenohumeral joint injection on 12/29/2021.  Both were helpful, but he the subacromial injection was more helpful.  Currently he is doing pretty well.  He has minimal pain at rest.  He gets some pain with reaching or carrying heavier objects.  He does feel that the Celebrex was extremely helpful, he ran out of this and hence his pain is slowly returning.  When he takes this he has no pain whatsoever.  Pertinent ROS were reviewed with the patient and found to be negative unless otherwise specified above in HPI.   Assessment & Plan: Visit Diagnoses:  1. Chronic right shoulder pain   2. Rotator cuff arthropathy of right shoulder    Plan: Discussed with Dortha Schwalbe that we are both pleased that he has had improvement in his shoulder pain with prior injection and physical therapy.  He has been discharged from therapy given his improvement.  I do want him to continue his home exercises about 3 times weekly.  We did refill his Celebrex as he had complete relief of his pain with this.  He can take this 100 mg once or twice daily only as needed for his pain.  We will follow-up in about 2 months to recheck his shoulder unless he has complete resolution of his pain.  Can consider repeat subacromial joint injection in the future if needed.  Follow-up: Return in about 2 months (around 07/24/2022) for for R-shoulder f/u.   Meds & Orders:  Meds ordered this encounter  Medications   celecoxib (CELEBREX) 100 MG capsule    Sig:  Take 1 capsule (100 mg total) by mouth 2 (two) times daily.    Dispense:  60 capsule    Refill:  1   No orders of the defined types were placed in this encounter.    Procedures: No procedures performed      Clinical History: No specialty comments available.  He reports that he quit smoking about 2 years ago. His smoking use included cigarettes. He has never used smokeless tobacco.  Recent Labs    01/25/22 1120  HGBA1C 6.0    Objective:    Physical Exam  Gen: Well-appearing, in no acute distress; non-toxic CV: Well-perfused. Warm.  Resp: Breathing unlabored on room air; no wheezing. Psych: Fluid speech in conversation; appropriate affect; normal thought process Neuro: Sensation intact throughout. No gross coordination deficits.   Ortho Exam - Right shoulder: Mild pain at Codman's point. No bony TTP. There is full active and passive ROM in all directions.  Very mild pain with empty can and resisted external rotation, although strength intact.  No mechanical blocks to internal or external rotation. NVI.  Imaging: No results found.  Past Medical/Family/Surgical/Social History: Medications & Allergies reviewed per EMR, new medications updated. Patient Active Problem List   Diagnosis Date Noted   Aortic atherosclerosis 05/20/2021   ED (erectile dysfunction) 01/27/2021   Controlled substance agreement signed 09/17/2020   Lung nodules 05/18/2020   Atherosclerosis of native coronary artery of native heart without angina pectoris  05/18/2020   Elevated PSA 12/26/2018   Flu vaccine need 10/08/2018   Spondylosis without myelopathy or radiculopathy, lumbar region 09/14/2018   Tobacco dependence 06/01/2018   Paresthesia 01/02/2018   Gastroesophageal reflux disease without esophagitis 09/15/2017   Idiopathic peripheral neuropathy 09/15/2017   Chronic lumbar radiculopathy 02/10/2017   Hyperlipidemia 03/14/2016   Lumbar degenerative disc disease 09/11/2015   Chronic pain syndrome  09/11/2015   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   Chronic low back pain    Past Medical History:  Diagnosis Date   Chronic low back pain 2010    GERD (gastroesophageal reflux disease)    Family History  Problem Relation Age of Onset   Ovarian cancer Mother    Other Father        unsure of medical history   Heart disease Neg Hx    Hypertension Neg Hx    Diabetes Neg Hx    Colon cancer Neg Hx    Past Surgical History:  Procedure Laterality Date   COLONOSCOPY N/A 05/15/2015   Procedure: COLONOSCOPY;  Surgeon: West Bali, MD;  [facility];  Service: Endoscopy;  Laterality: N/A;  145 - moved to 1:30 - office to notify   COLONOSCOPY WITH PROPOFOL N/A 03/30/2021   Procedure: COLONOSCOPY WITH PROPOFOL;  Surgeon: Lanelle Bal, DO;  [facility];  Service: Endoscopy;  Laterality: N/A;  10:00 / ASA 2   POLYPECTOMY  03/30/2021   Procedure: POLYPECTOMY;  Surgeon: Lanelle Bal, DO;  [facility];  Service: Endoscopy;;   Social History   Occupational History   Occupation: disability    Comment: [place]   Tobacco Use   Smoking status: Former    Types: Cigarettes    Quit date: 05/15/2020    Years since quitting: 2.0   Smokeless tobacco: Never  Vaping Use   Vaping Use: Never used  Substance and Sexual Activity   Alcohol use: Yes    Alcohol/week: 0.0 standard drinks of alcohol    Comment: social    Drug use: No   Sexual activity: Never

## 2022-05-27 ENCOUNTER — Encounter: Payer: Self-pay | Admitting: Internal Medicine

## 2022-05-27 ENCOUNTER — Ambulatory Visit: Payer: Medicaid Other | Attending: Internal Medicine | Admitting: Internal Medicine

## 2022-05-27 VITALS — BP 121/76 | HR 89 | Temp 98.4°F | Ht 65.0 in | Wt 153.0 lb

## 2022-05-27 DIAGNOSIS — F17200 Nicotine dependence, unspecified, uncomplicated: Secondary | ICD-10-CM

## 2022-05-27 DIAGNOSIS — E785 Hyperlipidemia, unspecified: Secondary | ICD-10-CM

## 2022-05-27 DIAGNOSIS — R972 Elevated prostate specific antigen [PSA]: Secondary | ICD-10-CM | POA: Diagnosis not present

## 2022-05-27 DIAGNOSIS — J432 Centrilobular emphysema: Secondary | ICD-10-CM | POA: Insufficient documentation

## 2022-05-27 DIAGNOSIS — I700 Atherosclerosis of aorta: Secondary | ICD-10-CM

## 2022-05-27 MED ORDER — NICOTINE 14 MG/24HR TD PT24: 14.0000 mg | MEDICATED_PATCH | Freq: Every day | TRANSDERMAL | 1 refills | Status: DC

## 2022-05-27 MED ORDER — TIOTROPIUM BROMIDE MONOHYDRATE 18 MCG IN CAPS: 18.0000 ug | ORAL_CAPSULE | Freq: Every day | RESPIRATORY_TRACT | 6 refills | Status: DC

## 2022-05-27 MED ORDER — ATORVASTATIN CALCIUM 20 MG PO TABS: ORAL_TABLET | ORAL | 1 refills | Status: DC

## 2022-05-27 NOTE — Progress Notes (Signed)
Patient ID: Nathaniel Robinson, male    DOB: Feb 18, 1955  MRN: [id number]  CC: Follow-up (Chronic conditions f/u. Janese Banks pain X10 years/)   Subjective: Nathaniel Robinson is a 67 y.o. male who presents for chronic ds management His concerns today include:  Pt with hx of HL, PreDM, chronic LBP, GERD, peripheral neuropathy, former smoker, centrilobular emphysema, lung nodules (due for repeat 03/2021), aortic and coronary Ca+, elev PSA (neg bx 02/2019), PreDM   [employer] Language interpreter used during this encounter. #[name]   Since last visit, patient has seen sports medicine specialist Dr. Shon Baton for his right shoulder.  Diagnosed with rotator cuff arthropathy.  He received injection and was referred for P.T.  Patient placed on Celebrex 100 mg BID -he has completed PT.  Shoulder is a lot better.  Chronic Lower back pain:  doing well on Tramadol taking it once a day.  Denies any significant side effects from the tramadol.  He is up-to-date with his controlled substance prescribing agreement and urine drug screen.   Elev PSA: followed by [facility].  Last seen 3 mths ago  COPD: using Spiriva every day.  Doing well.  Had COVID in 02/2022.  Admits that over the past mth he started smoking 2-3 cigarettes/day.    HL: Taking and tolerating atorvastatin 20 mg daily.   Patient Active Problem List   Diagnosis Date Noted   Centrilobular emphysema 05/27/2022   Aortic atherosclerosis 05/20/2021   ED (erectile dysfunction) 01/27/2021   Controlled substance agreement signed 09/17/2020   Lung nodules 05/18/2020   Atherosclerosis of native coronary artery of native heart without angina pectoris 05/18/2020   Elevated PSA 12/26/2018   Flu vaccine need 10/08/2018   Spondylosis without myelopathy or radiculopathy, lumbar region 09/14/2018   Tobacco dependence 06/01/2018   Paresthesia 01/02/2018   Gastroesophageal reflux disease without esophagitis 09/15/2017   Idiopathic peripheral neuropathy 09/15/2017    Chronic lumbar radiculopathy 02/10/2017   Hyperlipidemia 03/14/2016   Lumbar degenerative disc disease 09/11/2015   Chronic pain syndrome 09/11/2015   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 60 capsule 1   traMADol (ULTRAM) 50 MG tablet TAKE 1 TABLET(50 MG) BY MOUTH DAILY AS NEEDED 30 tablet 2   albuterol (PROAIR HFA) 108 (90 Base) MCG/ACT inhaler INHALE 2 PUFFS INTO THE LUNGS EVERY 6 HOURS AS NEEDED FOR WHEEZING OR SHORTNESS OF BREATH (Patient not taking: Reported on 05/27/2022) 8.5 g 2   No current facility-administered medications on file prior to visit.    No Known Allergies  Social History   Socioeconomic History   Marital status: Married    Spouse name: Not on file   Number of children: 4   Years of education: come college   Highest education level: Not on file  Occupational History   Occupation: disability    Comment: [place]   Tobacco Use   Smoking status: Former    Types: Cigarettes    Quit date: 05/15/2020    Years since quitting: 2.0   Smokeless tobacco: Never  Vaping Use   Vaping Use: Never used  Substance and Sexual Activity   Alcohol use: Yes    Alcohol/week: 0.0 standard drinks of alcohol    Comment: social    Drug  use: No   Sexual activity: Never  Other Topics Concern   Not on file  Social History Narrative   Form [place]   Moved to [place] in 01/2014    Lives with roommate.   Right-handed.   No caffeine use.   Wife and 4 children in [place].    Social Determinants of Health   Financial Resource Strain: Not on file  Food Insecurity: Not on file  Transportation Needs: Not on file  Physical Activity: Not on file  Stress: Not on file  Social Connections: Not on file  Intimate Partner Violence: Not on file     Family History  Problem Relation Age of Onset   Ovarian cancer Mother    Other Father        unsure of medical history   Heart disease Neg Hx    Hypertension Neg Hx    Diabetes Neg Hx    Colon cancer Neg Hx     Past Surgical History:  Procedure Laterality Date   COLONOSCOPY N/A 05/15/2015   Procedure: COLONOSCOPY;  Surgeon: West Bali, MD;  [facility];  Service: Endoscopy;  Laterality: N/A;  145 - moved to 1:30 - office to notify   COLONOSCOPY WITH PROPOFOL N/A 03/30/2021   Procedure: COLONOSCOPY WITH PROPOFOL;  Surgeon: Lanelle Bal, DO;  [facility];  Service: Endoscopy;  Laterality: N/A;  10:00 / ASA 2   POLYPECTOMY  03/30/2021   Procedure: POLYPECTOMY;  Surgeon: Lanelle Bal, DO;  [facility];  Service: Endoscopy;;    ROS: Review of Systems Negative except as stated above  PHYSICAL EXAM: BP 121/76 (BP Location: Left Arm, Patient Position: Sitting, Cuff Size: Normal)   Pulse 89   Temp 98.4 F (36.9 C) (Oral)   Ht  (1.651 m)   Wt 153 lb (69.4 kg)   SpO2 96%   BMI 25.46 kg/m   Physical Exam   General appearance - alert, well appearing, older male and in no distress Mental status - normal mood, behavior, speech, dress, motor activity, and thought processes Neck - supple, no significant adenopathy Chest - breath sounds slightly decrease with a few wheezes Heart - normal rate, regular rhythm, normal S1, S2, no murmurs, rubs, clicks or gallops Extremities - peripheral pulses normal, no pedal edema, no clubbing or cyanosis     Latest Ref Rng & Units 05/20/2021   12:16 PM 04/01/2020   10:32 AM 12/24/2018    4:49 PM  CMP  Glucose 70 - 99 mg/dL 409  811    BUN 8 - 27 mg/dL 11  9    Creatinine 9.14 - 1.27 mg/dL 7.82  9.56    Sodium 213 - 144 mmol/L 144  141    Potassium 3.5 - 5.2 mmol/L 4.6  4.2    Chloride 96 - 106 mmol/L 105  102    CO2 20 - 29 mmol/L 23  26    Calcium 8.6 - 10.2 mg/dL 9.3  9.5    Total  Protein 6.0 - 8.5 g/dL 6.9  6.7  6.1   Total Bilirubin 0.0 - 1.2 mg/dL 0.4  0.4  0.3   Alkaline Phos 44 - 121 IU/L 68  67  54   AST 0 - 40 IU/L ALT 0 - 44 IU/L Lipid Panel     Component Value Date/Time   CHOL 136 05/20/2021 1216  TRIG 207 (H) 05/20/2021 1216   HDL 45 05/20/2021 1216   CHOLHDL 3.0 05/20/2021 1216   CHOLHDL 3.5 03/10/2016 1412   VLDL 25 03/10/2016 1412   LDLCALC 57 05/20/2021 1216    CBC    Component Value Date/Time   WBC 6.7 05/20/2021 1216   WBC 5.6 11/12/2018 1227   RBC 5.28 05/20/2021 1216   RBC 4.77 11/12/2018 1227   HGB 16.9 05/20/2021 1216   HCT 50.4 05/20/2021 1216   PLT 226 05/20/2021 1216   MCV 96 05/20/2021 1216   MCH 32.0 05/20/2021 1216   MCH 32.9 11/12/2018 1227   MCHC 33.5 05/20/2021 1216   MCHC 34.2 11/12/2018 1227   RDW 12.8 05/20/2021 1216   LYMPHSABS 2.9 11/12/2018 1227   MONOABS 0.5 11/12/2018 1227   EOSABS 0.1 11/12/2018 1227   BASOSABS 0.0 11/12/2018 1227    ASSESSMENT AND PLAN:  1. Centrilobular emphysema Stable on Spiriva Strongly advise to discontinue smoking which she started doing again - tiotropium (SPIRIVA) 18 MCG inhalation capsule; Place 1 capsule (18 mcg total) into inhaler and inhale daily.  Dispense: 30 capsule; Refill: 6  2. Hyperlipidemia, unspecified hyperlipidemia type Last LDL was at goal.  Continue Lipitor - CBC - Comprehensive metabolic panel - Lipid panel - atorvastatin (LIPITOR) 20 MG tablet; 1 tab PO daily  Dispense: 90 tablet; Refill: 1  3. Aortic atherosclerosis Continue Lipitor 20 mg daily.  4. Elevated PSA Followed by urology.  5. Tobacco dependence Pt is current smoker. Patient advised to quit smoking. Discussed health risks associated with smoking including lung and other types of cancers, chronic lung diseases and CV risks.. Pt ready to give trail of quitting.   Discussed methods to help quit including quitting cold [place], use of NRT, Chantix and Bupropion.   Pt wanting to try: Nicotine patches.  We will prescribe the 14 mg patch.  He remembers how to use the patch. _3_ Minutes spent on counseling. F/U: Reassess progress on subsequent visit.  - nicotine (NICODERM CQ - DOSED IN MG/24 HOURS) 14 mg/24hr patch; Place 1 patch (14 mg total) onto the skin daily.  Dispense: 30 patch; Refill: 1    Patient was given the opportunity to ask questions.  Patient verbalized understanding of the plan and was able to repeat key elements of the plan.   This documentation was completed using Paediatric nurse.  Any transcriptional errors are unintentional.  Orders Placed This Encounter  Procedures   CBC   Comprehensive metabolic panel   Lipid panel     Requested Prescriptions   Signed Prescriptions Disp Refills   atorvastatin (LIPITOR) 20 MG tablet 90 tablet 1    Sig: 1 tab PO daily   tiotropium (SPIRIVA) 18 MCG inhalation capsule 30 capsule 6    Sig: Place 1 capsule (18 mcg total) into inhaler and inhale daily.   nicotine (NICODERM CQ - DOSED IN MG/24 HOURS) 14 mg/24hr patch 30 patch 1    Sig: Place 1 patch (14 mg total) onto the skin daily.    Return in about 4 months (around 09/26/2022).  Jonah Blue, MD, FACP

## 2022-05-28 LAB — COMPREHENSIVE METABOLIC PANEL
ALT: 16 IU/L (ref 0–44)
AST: 19 IU/L (ref 0–40)
Albumin/Globulin Ratio: 1.6 (ref 1.2–2.2)
Albumin: 4.1 g/dL (ref 3.9–4.9)
Alkaline Phosphatase: 64 IU/L (ref 44–121)
BUN/Creatinine Ratio: 14 (ref 10–24)
BUN: 11 mg/dL (ref 8–27)
Bilirubin Total: 0.4 mg/dL (ref 0.0–1.2)
CO2: 24 mmol/L (ref 20–29)
Calcium: 9.3 mg/dL (ref 8.6–10.2)
Chloride: 105 mmol/L (ref 96–106)
Creatinine, Ser: 0.77 mg/dL (ref 0.76–1.27)
Globulin, Total: 2.5 g/dL (ref 1.5–4.5)
Glucose: 96 mg/dL (ref 70–99)
Potassium: 4.8 mmol/L (ref 3.5–5.2)
Sodium: 140 mmol/L (ref 134–144)
Total Protein: 6.6 g/dL (ref 6.0–8.5)
eGFR: 99 mL/min/{1.73_m2} (ref >59)

## 2022-05-28 LAB — CBC
Hematocrit: 47.5 % (ref 37.5–51.0)
Hemoglobin: 16.2 g/dL (ref 13.0–17.7)
MCH: 31.8 pg (ref 26.6–33.0)
MCHC: 34.1 g/dL (ref 31.5–35.7)
MCV: 93 fL (ref 79–97)
Platelets: 239 10*3/uL (ref 150–450)
RBC: 5.09 x10E6/uL (ref 4.14–5.80)
RDW: 12.7 % (ref 11.6–15.4)
WBC: 6.2 10*3/uL (ref 3.4–10.8)

## 2022-05-28 LAB — LIPID PANEL
Chol/HDL Ratio: 2.9 ratio (ref 0.0–5.0)
Cholesterol, Total: 129 mg/dL (ref 100–199)
HDL: 45 mg/dL (ref >39)
LDL Chol Calc (NIH): 65 mg/dL (ref 0–99)
Triglycerides: 101 mg/dL (ref 0–149)
VLDL Cholesterol Cal: 19 mg/dL (ref 5–40)

## 2022-06-20 ENCOUNTER — Other Ambulatory Visit: Payer: Self-pay | Admitting: Sports Medicine

## 2022-06-22 ENCOUNTER — Other Ambulatory Visit: Payer: Self-pay | Admitting: Internal Medicine

## 2022-06-22 DIAGNOSIS — G8929 Other chronic pain: Secondary | ICD-10-CM

## 2022-06-22 NOTE — Telephone Encounter (Signed)
Requested medication (s) are due for refill today: yes  Requested medication (s) are on the active medication list: yes  Last refill:  02/27/22 #30/2 RF  Future visit scheduled: yes  Notes to clinic:  Unable to refill per protocol, cannot delegate.   Requested Prescriptions  Pending Prescriptions Disp Refills   traMADol (ULTRAM) 50 MG tablet [Pharmacy Med Name: TRAMADOL 50MG  TABLETS] 30 tablet     Sig: TAKE 1 TABLET(50 MG) BY MOUTH DAILY AS NEEDED     Not Delegated - Analgesics:  Opioid Agonists Failed - 06/22/2022  5:01 PM      Failed - This refill cannot be delegated      Passed - Urine Drug Screen completed in last 360 days      Passed - Valid encounter within last 3 months    Recent Outpatient Visits           3 weeks ago Centrilobular emphysema (HCC)   [facility] Marcine Matar, MD   4 months ago Prediabetes   [facility] Marcine Matar, MD   9 months ago Chronic right shoulder pain   [place] [facility] Marcine Matar, MD   1 year ago Annual physical exam   [facility] Marcine Matar, MD   1 year ago Centrilobular emphysema [facility])   [place] [facility] Marcine Matar, MD       Future Appointments             In 3 months Laural Benes, Binnie Rail, MD [facility]

## 2022-08-30 ENCOUNTER — Ambulatory Visit (HOSPITAL_COMMUNITY)
Admission: EM | Admit: 2022-08-30 | Discharge: 2022-08-30 | Disposition: A | Discharge status: Home / Self Care | Payer: Medicaid Other | Attending: Nurse Practitioner | Admitting: Nurse Practitioner

## 2022-08-30 ENCOUNTER — Encounter (HOSPITAL_COMMUNITY): Payer: Self-pay

## 2022-08-30 DIAGNOSIS — K219 Gastro-esophageal reflux disease without esophagitis: Secondary | ICD-10-CM | POA: Diagnosis not present

## 2022-08-30 DIAGNOSIS — R5383 Other fatigue: Secondary | ICD-10-CM | POA: Insufficient documentation

## 2022-08-30 DIAGNOSIS — G8929 Other chronic pain: Secondary | ICD-10-CM | POA: Insufficient documentation

## 2022-08-30 DIAGNOSIS — R630 Anorexia: Secondary | ICD-10-CM | POA: Insufficient documentation

## 2022-08-30 DIAGNOSIS — Z1152 Encounter for screening for COVID-19: Secondary | ICD-10-CM | POA: Insufficient documentation

## 2022-08-30 DIAGNOSIS — M5459 Other low back pain: Secondary | ICD-10-CM | POA: Diagnosis not present

## 2022-08-30 DIAGNOSIS — R519 Headache, unspecified: Secondary | ICD-10-CM | POA: Diagnosis not present

## 2022-08-30 LAB — POCT INFLUENZA A/B
Influenza A, POC: NEGATIVE
Influenza B, POC: NEGATIVE

## 2022-08-30 NOTE — Discharge Instructions (Addendum)
Your Influenza is negative. Your COVID is pending. Your results will show in your MyChart. Any positive results will result in a phone call from a nurse with next steps in treatment and recommendations.   We encourage conservative treatment with symptom relief. We encourage you to use Tylenol alternating with Ibuprofen for your fever if not contraindicated. (Remember to use as directed do not exceed daily dosing recommendations) .We also encourage salt water gargles for your sore throat. You should also consider throat lozenges and chloraseptic spray.

## 2022-08-30 NOTE — ED Provider Notes (Signed)
[facility]    CSN: [id number] Arrival date & time: 08/30/22  1013      History   Chief Complaint Chief Complaint  Patient presents with   Fatigue    headache    HPI Nathaniel Robinson is a 67 y.o. male.   HPI  He reports that he is in today for evaluation of headaches, fatigue, sweats and loss of appetite for 1 week.  He recently traveled to [facility] for a graduation.  He denies any recent known exposure.  He has not used any outside treatments.  He has been taking his prescribed medications.  He has some mild sore throat.  But denies any difficulty swallowing, ear pain, cough shortness of breath, chest pains, nausea or vomiting. Past Medical History:  Diagnosis Date   Chronic low back pain 2010    GERD (gastroesophageal reflux disease)     Patient Active Problem List   Diagnosis Date Noted   Centrilobular emphysema (HCC) 05/27/2022   Aortic atherosclerosis (HCC) 05/20/2021   ED (erectile dysfunction) 01/27/2021   Controlled substance agreement signed 09/17/2020   Lung nodules 05/18/2020   Atherosclerosis of native coronary artery of native heart without angina pectoris 05/18/2020   Elevated PSA 12/26/2018   Flu vaccine need 10/08/2018   Spondylosis without myelopathy or radiculopathy, lumbar region 09/14/2018   Tobacco dependence 06/01/2018   Paresthesia 01/02/2018   Gastroesophageal reflux disease without esophagitis 09/15/2017   Idiopathic peripheral neuropathy 09/15/2017   Chronic lumbar radiculopathy 02/10/2017   Hyperlipidemia 03/14/2016   Lumbar degenerative disc disease 09/11/2015   Chronic pain syndrome 09/11/2015   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   Chronic low back pain     Past Surgical History:  Procedure Laterality Date   COLONOSCOPY N/A 05/15/2015   Procedure: COLONOSCOPY;  Surgeon: West Bali, MD;  [facility];  Service: Endoscopy;  Laterality: N/A;  145 - moved to 1:30 - office to notify    COLONOSCOPY WITH PROPOFOL N/A 03/30/2021   Procedure: COLONOSCOPY WITH PROPOFOL;  Surgeon: Lanelle Bal, DO;  [facility];  Service: Endoscopy;  Laterality: N/A;  10:00 / ASA 2   POLYPECTOMY  03/30/2021   Procedure: POLYPECTOMY;  Surgeon: Lanelle Bal, DO;  [facility];  Service: Endoscopy;;       Home Medications    Prior to Admission medications   Medication Sig Start Date End Date Taking? Authorizing Provider  atorvastatin (LIPITOR) 20 MG tablet 1 tab PO daily 05/27/22  Yes Marcine Matar, MD  tiotropium (SPIRIVA) 18 MCG inhalation capsule Place 1 capsule (18 mcg total) into inhaler and inhale daily. 05/27/22  Yes Marcine Matar, MD  traMADol (ULTRAM) 50 MG tablet TAKE 1 TABLET(50 MG) BY MOUTH DAILY AS NEEDED 06/23/22  Yes Marcine Matar, MD    Family History Family History  Problem Relation Age of Onset   Ovarian cancer Mother    Other Father        unsure of medical history   Heart disease Neg Hx    Hypertension Neg Hx    Diabetes Neg Hx    Colon cancer Neg Hx     Social History Social History   Tobacco Use   Smoking status: Former    Current packs/day: 0.00    Types: Cigarettes    Quit date: 05/15/2020    Years since quitting: 2.2   Smokeless tobacco: Never  Vaping Use   Vaping status: Never  Used  Substance Use Topics   Alcohol use: Yes    Alcohol/week: 0.0 standard drinks of alcohol    Comment: social    Drug use: No     Allergies   Patient has no known allergies.   Review of Systems Review of Systems  Constitutional:  Positive for appetite change and chills.  HENT:  Positive for sore throat. Negative for ear pain and trouble swallowing.   Respiratory:  Negative for cough and shortness of breath.   Cardiovascular:  Negative for chest pain.  Gastrointestinal:  Negative for nausea and vomiting.     Physical Exam Triage Vital Signs ED Triage Vitals  Encounter Vitals Group     BP 08/30/22 1045 116/72      Systolic BP Percentile --      Diastolic BP Percentile --      Pulse Rate 08/30/22 1045 86     Resp 08/30/22 1045 16     Temp 08/30/22 1045 98.7 F (37.1 C)     Temp Source 08/30/22 1045 Oral     SpO2 08/30/22 1045 94 %     Weight 08/30/22 1044 154 lb 5.2 oz (70 kg)     Height 08/30/22 1044 5\' 5"  (1.651 m)     Head Circumference --      Peak Flow --      Pain Score 08/30/22 1044 0     Pain Loc --      Pain Education --      Exclude from Growth Chart --    No data found.  Updated Vital Signs BP 116/72 (BP Location: Right Arm)   Pulse 86   Temp 98.7 F (37.1 C) (Oral)   Resp 16   Ht 5\' 5"  (1.651 m)   Wt 154 lb 5.2 oz (70 kg)   SpO2 94%   BMI 25.68 kg/m   Visual Acuity Right Eye Distance:   Left Eye Distance:   Bilateral Distance:    Right Eye Near:   Left Eye Near:    Bilateral Near:     Physical Exam Constitutional:      General: He is not in acute distress.    Appearance: He is normal weight. He is not ill-appearing, toxic-appearing or diaphoretic.  HENT:     Head: Normocephalic and atraumatic.     Right Ear: Tympanic membrane normal.     Left Ear: Tympanic membrane normal.     Nose: Nose normal.     Mouth/Throat:     Mouth: Mucous membranes are moist.  Eyes:     Pupils: Pupils are equal, round, and reactive to light.  Cardiovascular:     Rate and Rhythm: Normal rate and regular rhythm.     Pulses: Normal pulses.     Heart sounds: Normal heart sounds.  Pulmonary:     Effort: Pulmonary effort is normal.  Musculoskeletal:        General: Normal range of motion.     Cervical back: Normal range of motion.  Skin:    General: Skin is warm and dry.     Capillary Refill: Capillary refill takes less than 2 seconds.  Neurological:     General: No focal deficit present.     Mental Status: He is alert and oriented to person, place, and time.  Psychiatric:        Mood and Affect: Mood normal.      UC Treatments / Results  Labs (all labs ordered are  listed, but only abnormal  results are displayed) Labs Reviewed  SARS CORONAVIRUS 2 (TAT 6-24 HRS)  POCT INFLUENZA A/B    EKG   Radiology No results found.  Procedures Procedures (including critical care time)  Medications Ordered in UC Medications - No data to display  Initial Impression / Assessment and Plan / UC Course  I have reviewed the triage vital signs and the nursing notes.  Pertinent labs & imaging results that were available during my care of the patient were reviewed by me and considered in my medical decision making (see chart for details).     Headache and fatigue concerned of COVID symptoms Final Clinical Impressions(s) / UC Diagnoses   Final diagnoses:  Fatigue, unspecified type     Discharge Instructions      Your Influenza is negative. Your COVID is pending. Your results will show in your MyChart. Any positive results will result in a phone call from a nurse with next steps in treatment and recommendations.   We encourage conservative treatment with symptom relief. We encourage you to use Tylenol alternating with Ibuprofen for your fever if not contraindicated. (Remember to use as directed do not exceed daily dosing recommendations) .We also encourage salt water gargles for your sore throat. You should also consider throat lozenges and chloraseptic spray.      ED Prescriptions   None    PDMP not reviewed this encounter.   Thad Ranger [place], [place] 08/30/22 1215

## 2022-08-30 NOTE — ED Notes (Signed)
Discharge instructions to pt via translator.

## 2022-08-30 NOTE — ED Triage Notes (Signed)
Patient here today with c/o HA, sweats, fatigue, and loss of appetite X 1 week. He has not taking anything to try and treat his symptoms. 6-7 months ago, he had Covid and experiencing the same symptoms as before. No sick contacts. He traveled a week ago.

## 2022-08-31 LAB — SARS CORONAVIRUS 2 (TAT 6-24 HRS): SARS Coronavirus 2: NEGATIVE

## 2022-09-27 ENCOUNTER — Ambulatory Visit: Payer: Medicaid Other | Attending: Internal Medicine | Admitting: Internal Medicine

## 2022-09-27 ENCOUNTER — Encounter: Payer: Self-pay | Admitting: Internal Medicine

## 2022-09-27 VITALS — BP 125/79 | HR 75 | Temp 98.0°F | Ht 65.0 in | Wt 154.0 lb

## 2022-09-27 DIAGNOSIS — J432 Centrilobular emphysema: Secondary | ICD-10-CM

## 2022-09-27 DIAGNOSIS — Z87891 Personal history of nicotine dependence: Secondary | ICD-10-CM | POA: Diagnosis not present

## 2022-09-27 DIAGNOSIS — G894 Chronic pain syndrome: Secondary | ICD-10-CM

## 2022-09-27 DIAGNOSIS — E785 Hyperlipidemia, unspecified: Secondary | ICD-10-CM | POA: Diagnosis not present

## 2022-09-27 DIAGNOSIS — Z79899 Other long term (current) drug therapy: Secondary | ICD-10-CM | POA: Diagnosis not present

## 2022-09-27 DIAGNOSIS — M545 Low back pain, unspecified: Secondary | ICD-10-CM

## 2022-09-27 MED ORDER — NICOTINE POLACRILEX 2 MG MT GUM: 2.0000 mg | CHEWING_GUM | OROMUCOSAL | 2 refills | Status: DC | PRN

## 2022-09-27 MED ORDER — TRAMADOL HCL 50 MG PO TABS: 50.0000 mg | ORAL_TABLET | Freq: Every day | ORAL | 2 refills | Status: DC | PRN

## 2022-09-27 MED ORDER — ATORVASTATIN CALCIUM 20 MG PO TABS: ORAL_TABLET | ORAL | 1 refills | Status: DC

## 2022-09-27 NOTE — Progress Notes (Signed)
Patient ID: Nathaniel Robinson, male    DOB: [birth year]  MRN: [id number]  CC: Hyperlipidemia (Hyperlipidemia f/u. Med refills. /Pt currently not working - requesting work letter that states he is able to working while sitting down due to pain in back when standing. )   Subjective: Nathaniel Robinson is a 67 y.o. male who presents for chronic ds management His concerns today include:  Pt with hx of HL, PreDM, chronic LBP, GERD, peripheral neuropathy, former smoker, centrilobular emphysema, lung nodules (due for repeat 03/2021), aortic and coronary Ca+, elev PSA (neg bx 02/2019), PreDM    [employer] Language interpreter used during this encounter. #Yohannes [id number].  Chronic LBP:  pt looking for a job that would allow him to sit and work. Request letter stating that due to his chronic back issues he should be allowed to sit to perform work duties  Still doing well on Tramadol taking it once a day. Last dose was this a.m. Denies any significant side effects from the tramadol. Allows him to function during the day. HL: taking and tolerating the Lipitor 20 mg Tob dep/COPD:  stopped smoking since last visit.  Request nicotine gum.  Doing well on Spiriva inhaler  Patient Active Problem List   Diagnosis Date Noted   Centrilobular emphysema (HCC) 05/27/2022   Aortic atherosclerosis (HCC) 05/20/2021   ED (erectile dysfunction) 01/27/2021   Controlled substance agreement signed 09/17/2020   Lung nodules 05/18/2020   Atherosclerosis of native coronary artery of native heart without angina pectoris 05/18/2020   Elevated PSA 12/26/2018   Flu vaccine need 10/08/2018   Spondylosis without myelopathy or radiculopathy, lumbar region 09/14/2018   Tobacco dependence 06/01/2018   Paresthesia 01/02/2018   Gastroesophageal reflux disease without esophagitis 09/15/2017   Idiopathic peripheral neuropathy 09/15/2017   Chronic lumbar radiculopathy 02/10/2017   Hyperlipidemia 03/14/2016   Lumbar degenerative disc disease  09/11/2015   Chronic pain syndrome 09/11/2015   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 30 capsule 6   traMADol (ULTRAM) 50 MG tablet TAKE 1 TABLET(50 MG) BY MOUTH DAILY AS NEEDED 30 tablet 2   No current facility-administered medications on file prior to visit.    No Known Allergies  Social History   Socioeconomic History   Marital status: Married    Spouse name: Not on file   Number of children: 4   Years of education: come college   Highest education level: Not on file  Occupational History   Occupation: disability    Comment: [place]   Tobacco Use   Smoking status: Former    Current packs/day: 0.00    Types: Cigarettes    Quit date: 05/15/2020    Years since quitting: 2.3   Smokeless tobacco: Never  Vaping Use   Vaping status: Never Used  Substance and Sexual Activity   Alcohol use: Yes    Alcohol/week: 0.0 standard drinks of alcohol    Comment: social    Drug use: No   Sexual activity: Never  Other Topics Concern   Not on file  Social History Narrative   Form [place]   Moved to [place] in 01/2014    Lives with  roommate.   Right-handed.   No caffeine use.   Wife and 4 children in [place].    Social Determinants of Health   Financial Resource Strain: Not on file  Food Insecurity: Not on file  Transportation Needs: Not on file  Physical Activity: Not on file  Stress: Not on file  Social Connections: Not on file  Intimate Partner Violence: Not on file    Family History  Problem Relation Age of Onset   Ovarian cancer Mother    Other Father        unsure of medical history   Heart disease Neg Hx    Hypertension Neg Hx    Diabetes Neg Hx     Colon cancer Neg Hx     Past Surgical History:  Procedure Laterality Date   COLONOSCOPY N/A 05/15/2015   Procedure: COLONOSCOPY;  Surgeon: West Bali, MD;  [facility];  Service: Endoscopy;  Laterality: N/A;  145 - moved to 1:30 - office to notify   COLONOSCOPY WITH PROPOFOL N/A 03/30/2021   Procedure: COLONOSCOPY WITH PROPOFOL;  Surgeon: Lanelle Bal, DO;  [facility];  Service: Endoscopy;  Laterality: N/A;  10:00 / ASA 2   POLYPECTOMY  03/30/2021   Procedure: POLYPECTOMY;  Surgeon: Lanelle Bal, DO;  [facility];  Service: Endoscopy;;    ROS: Review of Systems Negative except as stated above  PHYSICAL EXAM: BP 125/79 (BP Location: Left Arm, Patient Position: Sitting, Cuff Size: Normal)   Pulse 75   Temp 98 F (36.7 C) (Oral)   Ht 5\' 5"  (1.651 m)   Wt 154 lb (69.9 kg)   SpO2 96%   BMI 25.63 kg/m   Physical Exam   General appearance - alert, well appearing, and in no distress Mental status - normal mood, behavior, speech, dress, motor activity, and thought processes Neck - supple, no significant adenopathy Chest - clear to auscultation, no wheezes, rales or rhonchi, symmetric air entry Heart - normal rate, regular rhythm, normal S1, S2, no murmurs, rubs, clicks or gallops Extremities - peripheral pulses normal, no pedal edema, no clubbing or cyanosis     09/27/2022   11:05 AM  Opioid Risk   Alcohol 0  Illegal Drugs 0  Rx Drugs 0  Alcohol 0  Illegal Drugs 0  Rx Drugs 0  Age between 16-45 years  0  History of Preadolescent Sexual Abuse 0  Psychological Disease 0  Depression 0  Opioid Risk Tool Scoring 0  Opioid Risk Interpretation Low Risk       Latest Ref Rng & Units 05/27/2022   11:51 AM 05/20/2021   12:16 PM 04/01/2020   10:32 AM  CMP  Glucose 70 - 99 mg/dL 96  147  829   BUN 8 - 27 mg/dL 11  11  9    Creatinine 0.76 - 1.27 mg/dL 5.62  1.30  8.65   Sodium 134 - 144 mmol/L 140  144  141   Potassium 3.5 - 5.2  mmol/L 4.8  4.6  4.2   Chloride 96 - 106 mmol/L 105  105  102   CO2 20 - 29 mmol/L 24  23  26    Calcium 8.6 - 10.2 mg/dL 9.3  9.3  9.5   Total Protein 6.0 - 8.5 g/dL 6.6  6.9  6.7   Total Bilirubin 0.0 - 1.2 mg/dL 0.4  0.4  0.4   Alkaline Phos 44 - 121 IU/L 64  68  67  AST 0 - 40 IU/L 19  20  24    ALT 0 - 44 IU/L 16  17  26     Lipid Panel     Component Value Date/Time   CHOL 129 05/27/2022 1151   TRIG 101 05/27/2022 1151   HDL 45 05/27/2022 1151   CHOLHDL 2.9 05/27/2022 1151   CHOLHDL 3.5 03/10/2016 1412   VLDL 25 03/10/2016 1412   LDLCALC 65 05/27/2022 1151    CBC    Component Value Date/Time   WBC 6.2 05/27/2022 1151   WBC 5.6 11/12/2018 1227   RBC 5.09 05/27/2022 1151   RBC 4.77 11/12/2018 1227   HGB 16.2 05/27/2022 1151   HCT 47.5 05/27/2022 1151   PLT 239 05/27/2022 1151   MCV 93 05/27/2022 1151   MCH 31.8 05/27/2022 1151   MCH 32.9 11/12/2018 1227   MCHC 34.1 05/27/2022 1151   MCHC 34.2 11/12/2018 1227   RDW 12.7 05/27/2022 1151   LYMPHSABS 2.9 11/12/2018 1227   MONOABS 0.5 11/12/2018 1227   EOSABS 0.1 11/12/2018 1227   BASOSABS 0.0 11/12/2018 1227    ASSESSMENT AND PLAN: 1. Hyperlipidemia, unspecified hyperlipidemia type Stable on atorvastatin.  LDL done in April of this year was at goal.  Liver function test normal.  Continue atorvastatin 20 mg daily - atorvastatin (LIPITOR) 20 MG tablet; 1 tab PO daily  Dispense: 90 tablet; Refill: 1  2. Centrilobular emphysema (HCC) Stable on Spiriva.  Commended him on quitting smoking.  3. Former smoker Encouraged him to remain tobacco free.  Prescription given for nicotine gum. - nicotine polacrilex (NICORETTE) 2 MG gum; Take 1 each (2 mg total) by mouth as needed for smoking cessation.  Dispense: 100 tablet; Refill: 2  4. Chronic left-sided low back pain without sciatica Stable on tramadol and reports good function and no significant side effects. He has not had any aberrant behaviors in regards to the tramadol  use. NCCS[place] reviewed. Opioid risk assessment completed. Updated controlled substance prescribing agreement.  The agreement is in [place].  I had my medical assistant read it line for line with him using the interpreter.  Patient agreeable to signing it. - traMADol (ULTRAM) 50 MG tablet; Take 1 tablet (50 mg total) by mouth daily as needed.  Dispense: 30 tablet; Refill: 2 - [id number] 11+Oxyco+Alc+Crt-Bund  5. Chronic pain syndrome - [id number] 11+Oxyco+Alc+Crt-Bund  6. Controlled substance agreement signed - [id number] 11+Oxyco+Alc+Crt-Bund      Patient was given the opportunity to ask questions.  Patient verbalized understanding of the plan and was able to repeat key elements of the plan.   This documentation was completed using Paediatric nurse.  Any transcriptional errors are unintentional.  No orders of the defined types were placed in this encounter.    Requested Prescriptions   Pending Prescriptions Disp Refills   atorvastatin (LIPITOR) 20 MG tablet 90 tablet 1    Sig: 1 tab PO daily   traMADol (ULTRAM) 50 MG tablet 30 tablet 2    No follow-ups on file.  Jonah Blue, MD, FACP

## 2022-10-07 ENCOUNTER — Telehealth: Payer: Self-pay | Admitting: Internal Medicine

## 2022-10-07 DIAGNOSIS — F17200 Nicotine dependence, unspecified, uncomplicated: Secondary | ICD-10-CM

## 2022-10-07 NOTE — Telephone Encounter (Signed)
Marchelle Folks with [facility] is calling in because pt was prescribed Nicotine gum and the gum isn't covered by pt's insurance. Marchelle Folks says the Nicotine patches are covered but she recommends a higher dose (21 MG) based on how much the pt smokes. Marchelle Folks says there will be no change in copays for the higher dosage and wanted to know how PCP felt about changing the dosage. Please follow up with [facility].

## 2022-10-15 ENCOUNTER — Other Ambulatory Visit: Payer: Self-pay | Admitting: Internal Medicine

## 2022-10-15 MED ORDER — NICOTINE 21 MG/24HR TD PT24: 21.0000 mg | MEDICATED_PATCH | Freq: Every day | TRANSDERMAL | 1 refills | Status: DC

## 2022-11-03 ENCOUNTER — Encounter: Payer: Self-pay | Admitting: Physical Medicine & Rehabilitation

## 2022-11-03 ENCOUNTER — Encounter: Payer: Medicare Other | Attending: Physical Medicine & Rehabilitation | Admitting: Physical Medicine & Rehabilitation

## 2022-11-03 VITALS — BP 132/84 | HR 76 | Ht 65.0 in | Wt 155.4 lb

## 2022-11-03 DIAGNOSIS — M47816 Spondylosis without myelopathy or radiculopathy, lumbar region: Secondary | ICD-10-CM | POA: Diagnosis not present

## 2022-11-03 NOTE — Progress Notes (Signed)
Subjective:    Patient ID: Nathaniel Robinson, male    DOB: 09-Aug-1955, 67 y.o.   MRN: [id number]  Signed Cosign: Cosign Not Required Encounter Date: 01/14/2021   Editor: Erick Colace, MD (Physician)              Left L5 dorsal ramus., left L4 and left L3 medial branch radio frequency neurotomy under fluoroscopic guidance     HPI 67 year old male with lumbar spondylosis without myelopathy who has been well-managed in the past with lumbar radiofrequency neurotomy last performed almost 2 years ago.  His last clinic visit for injections was on 01/14/2021.  Interval history positive for right shoulder pain has undergone physical therapy as well as right subacromial bursa injection, other pertinent interval history was a ED visit for chronic bilateral low back pain without sciatica on 04/13/2021.  The patient has been taking tramadol 50 mg as needed prescribed by his PCP last prescription was on 09/27/2022 with 2 refills.  He has a signed controlled substance agreement with his primary care physician.  Back pain worsening x 1 month mainly on left side  Patient denies any falls or new trauma to that area.  It feels very similar to his prior pain.  He has had very good pain relief of at least 50% for approximately 18 months post injection.  His pain is mainly with prolonged standing or walking.  His standing tolerance is approximately 1 hour Pain Inventory Average Pain 6 Pain Right Now 6 My pain is pain not able to describe  In the last 24 hours, has pain interfered with the following? General activity 5 Relation with others 6 Enjoyment of life 6 What TIME of day is your pain at its worst? daytime Sleep (in general) Fair  Pain is worse with: walking, bending, standing, and some activites Pain improves with: rest, medication, and injections Relief from Meds: 6  Family History  Problem Relation Age of Onset   Ovarian cancer Mother    Other Father        unsure of medical history   Heart  disease Neg Hx    Hypertension Neg Hx    Diabetes Neg Hx    Colon cancer Neg Hx    Social History   Socioeconomic History   Marital status: Married    Spouse name: Not on file   Number of children: 4   Years of education: come college   Highest education level: Not on file  Occupational History   Occupation: disability    Comment: [place]   Tobacco Use   Smoking status: Former    Current packs/day: 0.00    Types: Cigarettes    Quit date: 05/15/2020    Years since quitting: 2.4   Smokeless tobacco: Never  Vaping Use   Vaping status: Never Used  Substance and Sexual Activity   Alcohol use: Yes    Alcohol/week: 0.0 standard drinks of alcohol    Comment: social    Drug use: No   Sexual activity: Never  Other Topics Concern   Not on file  Social History Narrative   Form [place]   Moved to [place] in 01/2014    Lives with roommate.   Right-handed.   No caffeine use.   Wife and 4 children in [place].    Social Determinants of Health   Financial Resource Strain: Not on file  Food Insecurity: Not on file  Transportation Needs: Not on file  Physical Activity: Not on file  Stress: Not on  file  Social Connections: Not on file   Past Surgical History:  Procedure Laterality Date   COLONOSCOPY N/A 05/15/2015   Procedure: COLONOSCOPY;  Surgeon: West Bali, MD;  [facility];  Service: Endoscopy;  Laterality: N/A;  145 - moved to 1:30 - office to notify   COLONOSCOPY WITH PROPOFOL N/A 03/30/2021   Procedure: COLONOSCOPY WITH PROPOFOL;  Surgeon: Lanelle Bal, DO;  [facility];  Service: Endoscopy;  Laterality: N/A;  10:00 / ASA 2   POLYPECTOMY  03/30/2021   Procedure: POLYPECTOMY;  Surgeon: Lanelle Bal, DO;  [facility];  Service: Endoscopy;;   Past Surgical History:  Procedure Laterality Date   COLONOSCOPY N/A 05/15/2015   Procedure: COLONOSCOPY;  Surgeon: West Bali, MD;  [facility];  Service: Endoscopy;   Laterality: N/A;  145 - moved to 1:30 - office to notify   COLONOSCOPY WITH PROPOFOL N/A 03/30/2021   Procedure: COLONOSCOPY WITH PROPOFOL;  Surgeon: Lanelle Bal, DO;  [facility];  Service: Endoscopy;  Laterality: N/A;  10:00 / ASA 2   POLYPECTOMY  03/30/2021   Procedure: POLYPECTOMY;  Surgeon: Lanelle Bal, DO;  [facility];  Service: Endoscopy;;   Past Medical History:  Diagnosis Date   Chronic low back pain 2010    GERD (gastroesophageal reflux disease)    Ht 5\' 5"  (1.651 m)   Wt 155 lb 6.4 oz (70.5 kg)   BMI 25.86 kg/m   Opioid Risk Score:   Fall Risk Score:  `1  Depression screen [facility] 2/9     09/27/2022   10:26 AM 05/27/2022   11:01 AM 01/25/2022   10:54 AM 09/21/2021   10:19 AM 05/20/2021   10:47 AM 09/17/2020    9:53 AM 08/14/2020   10:12 AM  Depression screen PHQ 2/9  Decreased Interest 0 0 0 0 0 0 0  Down, Depressed, Hopeless 0 0 0 0 0 0 0  PHQ - 2 Score 0 0 0 0 0 0 0  Altered sleeping 0 0 0 0     Tired, decreased energy 0 0 0 0     Change in appetite 0 0 0 0     Feeling bad or failure about yourself  0 0 0 0     Trouble concentrating 0 0 0 0     Moving slowly or fidgety/restless 0 0 0 0     Suicidal thoughts 0 0 0 0     PHQ-9 Score 0 0 0 0       Review of Systems  Musculoskeletal:  Positive for back pain.  All other systems reviewed and are negative.     Objective:   Physical Exam  General No acute distress Mood and affect are appropriate Extremities without edema Negative straight leg raising test bilaterally Motor strength is 5/5 bilateral hip flexor knee extensor ankle dorsiflexor Lumbar spine has normal lumbar flexion lumbar extension is limited to 50% accompanied by pain mainly on the left side.  Pain with left lateral leaning compared to the right side. There is tenderness palpation around the L4-L5 and S1 paraspinal region on the left side greater than right side. No evidence of scoliosis or other spinal deformity  noted. Ambulates without assistive device no evidence of toe drag or knee instability      Assessment & Plan:  1.  Lumbar spondylosis without myelopathy.  He has had excellent relief, greater than 50% relief of  pain for more than 18 months but now is starting to have recurrence of his usual left-sided low back pain.  Will schedule for repeat RFA L3-L4 medial branch and L5 dorsal ramus to ablate nerves to the L4-5 and L5-S1 spinal levels on the left side.

## 2022-11-30 ENCOUNTER — Telehealth: Payer: Self-pay | Admitting: Physical Medicine & Rehabilitation

## 2022-11-30 MED ORDER — DIAZEPAM 5 MG PO TABS: 5.0000 mg | ORAL_TABLET | Freq: Once | ORAL | 0 refills | Status: AC

## 2022-11-30 NOTE — Telephone Encounter (Signed)
Patient called in , has a procedure Friday and needs medication before procedure called in to [facility] on [address]

## 2022-11-30 NOTE — Telephone Encounter (Signed)
Patient is having procedure on Friday and needs valium called in.

## 2022-11-30 NOTE — Addendum Note (Signed)
Addended by: Erick Colace on: 11/30/2022 11:39 AM   Modules accepted: Orders

## 2022-11-30 NOTE — Addendum Note (Signed)
Addended by: Sydnee Cabal D on: 11/30/2022 09:48 AM   Modules accepted: Orders

## 2022-12-02 ENCOUNTER — Encounter: Payer: Medicare Other | Admitting: Physical Medicine & Rehabilitation

## 2022-12-02 DIAGNOSIS — M47816 Spondylosis without myelopathy or radiculopathy, lumbar region: Secondary | ICD-10-CM

## 2022-12-19 NOTE — Progress Notes (Deleted)
   Subjective:    Patient ID: Nathaniel Robinson, male    DOB: 07-05-1955, 67 y.o.   MRN: [id number]  HPI    PROCEDURE RECORD [facility]   Name: Nathaniel Robinson DOB:August 15, 1955 MRN: [id number]  Date:12/19/2022  Physician: Claudette Laws, MD    Nurse/CMA: Charise Carwin MA  Allergies: No Known Allergies  Consent Signed: {yes NW:[id number]}  Is patient diabetic? {yes no:314532}  CBG today? ***  Pregnant: {yes no:314532} LMP: No LMP for male patient. (age 70-55)  Anticoagulants: {Yes/No:19989} Anti-inflammatory: {Yes/No:19989} Antibiotics: {Yes/No:19989}  Procedure: Left L3,4,5 Radiofrequency  Position: Prone Start Time: ***  End Time: ***  Fluoro Time: ***  RN/CMA      Time      BP      Pulse      Respirations      O2 Sat      S/S      Pain Level       D/C home with ***, patient A & O X 3, D/C instructions reviewed, and sits independently.        Review of Systems     Objective:   Physical Exam        Assessment & Plan:

## 2022-12-29 ENCOUNTER — Encounter: Payer: Medicare Other | Admitting: Physical Medicine & Rehabilitation

## 2023-01-17 ENCOUNTER — Other Ambulatory Visit: Payer: Self-pay | Admitting: Internal Medicine

## 2023-01-17 DIAGNOSIS — M545 Low back pain, unspecified: Secondary | ICD-10-CM

## 2023-01-26 ENCOUNTER — Encounter: Payer: Self-pay | Admitting: Physical Medicine & Rehabilitation

## 2023-01-26 ENCOUNTER — Encounter: Payer: Medicare Other | Attending: Physical Medicine & Rehabilitation | Admitting: Physical Medicine & Rehabilitation

## 2023-01-26 VITALS — BP 124/74 | HR 80 | Temp 97.4°F | Ht 65.0 in

## 2023-01-26 DIAGNOSIS — M47816 Spondylosis without myelopathy or radiculopathy, lumbar region: Secondary | ICD-10-CM | POA: Diagnosis not present

## 2023-01-26 MED ORDER — LIDOCAINE HCL 1 % IJ SOLN
10.0000 mL | Freq: Once | INTRAMUSCULAR | Status: AC
  Administered 2023-01-26: 10 mL

## 2023-01-26 MED ORDER — LIDOCAINE HCL (PF) 2 % IJ SOLN
3.0000 mL | Freq: Once | INTRAMUSCULAR | Status: AC
  Administered 2023-01-26: 3 mL

## 2023-01-26 NOTE — Progress Notes (Signed)
  PROCEDURE RECORD [facility]   Name: Nathaniel Robinson DOB:09-09-1955 MRN: [id number]  Date:01/26/2023  Physician: Claudette Laws, MD    Nurse/CMA: Suriah Peragine RMA  Allergies: No Known Allergies  Consent Signed: Yes.    Is patient diabetic? No.  CBG today? .  Pregnant: No. LMP: No LMP for male patient. (age 66-55)  Anticoagulants: no Anti-inflammatory: no Antibiotics: no  Procedure: Left L3-4-5 Radiofrequency  Position: Prone Start Time: 11:20AM  End Time: 11:38 AM  Fluoro Time: 44  RN/CMA Lizzeth Meder RMA Opie Fanton RMA    Time 11:07 11:40     BP 124/74 126/76    Pulse 80 97    Respirations 16 16    O2 Sat 96 94    S/S 6 6    Pain Level 6/10 5/10     D/C home with Self, patient A & O X 3, D/C instructions reviewed, and sits independently.

## 2023-01-26 NOTE — Patient Instructions (Signed)
You had a radio frequency procedure today This was done to alleviate joint pain in your lumbar area We injected lidocaine which is a local anesthetic.  You may experience soreness at the injection sites. You may also experienced some irritation of the nerves that were heated I'm recommending ice for 30 minutes every 2 hours as needed for the next 24-48 hours   

## 2023-01-26 NOTE — Progress Notes (Signed)
Left L5 dorsal ramus., left L4 and left L3 medial branch radio frequency neurotomy under fluoroscopic guidance  Indication: Low back pain due to lumbar spondylosis which has been relieved on 2 occasions by greater than 50% by lumbar medial branch blocks at corresponding levels.  Informed consent was obtained after describing risks and benefits of the procedure with the patient, this includes bleeding, bruising, infection, paralysis and medication side effects. The patient wishes to proceed and has given written consent. The patient was placed in a prone position. The lumbar and sacral area was marked and prepped with Betadine. A 25-gauge 1-1/2 inch needle was inserted into the skin and subcutaneous tissue at 3 sites in one ML of 2% lidocaine was injected into each site. Then a 18-gauge 10 cm radio frequency needle with a 1 cm curved active tip was inserted targeting the left S1 SAP/sacral ala junction. Bone contact was made and confirmed with lateral imaging.  motor stimulation at 2 Hz confirm proper needle location followed by injection of one ML of 2% MPF lidocaine. Then the left L5 SAP/transverse process junction was targeted. Bone contact was made and confirmed with lateral imaging motor stimulation at 2 Hz confirm proper needle location followed by injection of one ML of the solution containing one ML of  2% MPF lidocaine. Then the left L4 SAP/transverse process junction was targeted. Bone contact was made and confirmed with lateral imaging. motor stimulation at 2 Hz confirm proper needle location followed by injection of one ML of the solution containing one ML of2% MPF lidocaine. Radio frequency lesion  at [facility] for 90 seconds was performed. Needles were removed. Post procedure instructions and vital signs were performed. Patient tolerated procedure well. Followup appointment was given.

## 2023-02-03 ENCOUNTER — Encounter: Payer: Self-pay | Admitting: Internal Medicine

## 2023-02-03 ENCOUNTER — Ambulatory Visit: Payer: Medicare Other | Attending: Internal Medicine | Admitting: Internal Medicine

## 2023-02-03 VITALS — BP 121/80 | HR 87 | Ht 65.0 in | Wt 160.8 lb

## 2023-02-03 DIAGNOSIS — R7303 Prediabetes: Secondary | ICD-10-CM | POA: Diagnosis not present

## 2023-02-03 DIAGNOSIS — E785 Hyperlipidemia, unspecified: Secondary | ICD-10-CM | POA: Insufficient documentation

## 2023-02-03 DIAGNOSIS — M47816 Spondylosis without myelopathy or radiculopathy, lumbar region: Secondary | ICD-10-CM | POA: Diagnosis not present

## 2023-02-03 DIAGNOSIS — G629 Polyneuropathy, unspecified: Secondary | ICD-10-CM | POA: Diagnosis not present

## 2023-02-03 DIAGNOSIS — J432 Centrilobular emphysema: Secondary | ICD-10-CM | POA: Diagnosis not present

## 2023-02-03 DIAGNOSIS — F17210 Nicotine dependence, cigarettes, uncomplicated: Secondary | ICD-10-CM | POA: Diagnosis not present

## 2023-02-03 DIAGNOSIS — G8929 Other chronic pain: Secondary | ICD-10-CM | POA: Diagnosis not present

## 2023-02-03 DIAGNOSIS — M5459 Other low back pain: Secondary | ICD-10-CM | POA: Insufficient documentation

## 2023-02-03 DIAGNOSIS — R911 Solitary pulmonary nodule: Secondary | ICD-10-CM | POA: Insufficient documentation

## 2023-02-03 DIAGNOSIS — F17200 Nicotine dependence, unspecified, uncomplicated: Secondary | ICD-10-CM

## 2023-02-03 DIAGNOSIS — I2510 Atherosclerotic heart disease of native coronary artery without angina pectoris: Secondary | ICD-10-CM | POA: Insufficient documentation

## 2023-02-03 DIAGNOSIS — K219 Gastro-esophageal reflux disease without esophagitis: Secondary | ICD-10-CM | POA: Insufficient documentation

## 2023-02-03 DIAGNOSIS — Z79899 Other long term (current) drug therapy: Secondary | ICD-10-CM | POA: Insufficient documentation

## 2023-02-03 LAB — POCT GLYCOSYLATED HEMOGLOBIN (HGB A1C): HbA1c, POC (prediabetic range): 5.8 % (ref 5.7–6.4)

## 2023-02-03 LAB — GLUCOSE, POCT (MANUAL RESULT ENTRY): POC Glucose: 101 mg/dL — AB (ref 70–99)

## 2023-02-03 NOTE — Progress Notes (Signed)
Patient ID: Nathaniel Robinson, male    DOB: October 28, 1955  MRN: [id number]  CC: Medical Management of Chronic Issues   Subjective: Nathaniel Robinson is a 67 y.o. male who presents for chronic ds management. Kibrom, from [facility], interprets.  His concerns today include:  Pt with hx of HL, PreDM, chronic LBP, GERD, peripheral neuropathy, former smoker, centrilobular emphysema, lung nodules (due for repeat 03/2021), aortic and coronary Ca+, elev PSA (neg bx 02/2019), PreDM   Saw Dr. Wynn Banker 01/26/23 and had nerve block to L3-5 for spondylosis without myelopathy.  Reports it does help but does not like the size of the needle that is used during the procedure.  He continues to take tramadol as needed without significant side effects.  He is up-to-date with his controlled substance prescribing agreement and urine drug screen.    HL: Taking and tolerating Lipitor 20 mg daily  COPD:  uses Spiriva now only when needed rather than every day. Still struggling to quit smoking.  His insurance does not pay for nicotine patches  PreDM: Results for orders placed or performed in visit on 02/03/23  POCT glycosylated hemoglobin (Hb A1C)   Collection Time: 02/03/23 10:49 AM  Result Value Ref Range   Hemoglobin A1C     HbA1c POC (<> result, manual entry)     HbA1c, POC (prediabetic range) 5.8 5.7 - 6.4 %   HbA1c, POC (controlled diabetic range)    POCT glucose (manual entry)   Collection Time: 02/03/23 10:49 AM  Result Value Ref Range   POC Glucose 101 (A) 70 - 99 mg/dl  Z6W has dec from 6.0 one yr ago Reports he does well with eating habits.  Walking 30 mins a day.     HM:  had flu shot 2 mths ago [facility] on [address].  Has not had COVID booster but would like to get it.   Patient Active Problem List   Diagnosis Date Noted   Centrilobular emphysema (HCC) 05/27/2022   Aortic atherosclerosis (HCC) 05/20/2021   ED (erectile dysfunction) 01/27/2021   Controlled substance agreement signed 09/17/2020   Lung  nodules 05/18/2020   Atherosclerosis of native coronary artery of native heart without angina pectoris 05/18/2020   Elevated PSA 12/26/2018   Flu vaccine need 10/08/2018   Spondylosis without myelopathy or radiculopathy, lumbar region 09/14/2018   Tobacco dependence 06/01/2018   Paresthesia 01/02/2018   Gastroesophageal reflux disease without esophagitis 09/15/2017   Idiopathic peripheral neuropathy 09/15/2017   Chronic lumbar radiculopathy 02/10/2017   Hyperlipidemia 03/14/2016   Lumbar degenerative disc disease 09/11/2015   Chronic pain syndrome 09/11/2015   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     traMADol (ULTRAM) 50 MG tablet Take 1 tablet (50 mg total) by mouth 2 (two) times daily as needed. 30 tablet 2   tiotropium (SPIRIVA) 18 MCG inhalation capsule Place 1 capsule (18 mcg total) into inhaler and inhale daily. (Patient not taking: Reported on 02/03/2023) 30 capsule 6   No current facility-administered medications on file prior to visit.    No Known Allergies  Social History   Socioeconomic History   Marital status: Married    Spouse name: Not on file  Number of children: 4   Years of education: come college   Highest education level: Not on file  Occupational History   Occupation: disability    Comment: [place]   Tobacco Use   Smoking status: Some Days    Current packs/day: 0.00    Types: Cigarettes    Last attempt to quit: 05/15/2020    Years since quitting: 2.7   Smokeless tobacco: Never  Vaping Use   Vaping status: Never Used  Substance and Sexual Activity   Alcohol use: Yes    Alcohol/week: 0.0 standard drinks of alcohol    Comment: social    Drug use: No   Sexual activity:  Never  Other Topics Concern   Not on file  Social History Narrative   Form [place]   Moved to [place] in 01/2014    Lives with roommate.   Right-handed.   No caffeine use.   Wife and 4 children in [place].    Social Drivers of Corporate investment banker Strain: Low Risk  (02/03/2023)   Overall Financial Resource Strain (CARDIA)    Difficulty of Paying Living Expenses: Not very hard  Food Insecurity: No Food Insecurity (02/03/2023)   Hunger Vital Sign    Worried About Running Out of Food in the Last Year: Never true    Ran Out of Food in the Last Year: Never true  Transportation Needs: No Transportation Needs (02/03/2023)   PRAPARE - Administrator, Civil Service (Medical): No    Lack of Transportation (Non-Medical): No  Physical Activity: Insufficiently Active (02/03/2023)   Exercise Vital Sign    Days of Exercise per Week: 1 day    Minutes of Exercise per Session: 60 min  Stress: No Stress Concern Present (02/03/2023)   [employer] of Occupational Health - Occupational Stress Questionnaire    Feeling of Stress : Not at all  Social Connections: Moderately Isolated (02/03/2023)   Social Connection and Isolation Panel [NHANES]    Frequency of Communication with Friends and Family: Twice a week    Frequency of Social Gatherings with Friends and Family: Twice a week    Attends Religious Services: Never    Database administrator or Organizations: No    Attends Banker Meetings: Never    Marital Status: Married  Catering manager Violence: Not At Risk (02/03/2023)   Humiliation, Afraid, Rape, and Kick questionnaire    Fear of Current or Ex-Partner: No    Emotionally Abused: No    Physically Abused: No    Sexually Abused: No    Family History  Problem Relation Age of Onset   Ovarian cancer Mother    Other Father        unsure of medical history   Heart disease Neg Hx    Hypertension Neg Hx    Diabetes Neg Hx    Colon cancer Neg Hx     Past  Surgical History:  Procedure Laterality Date   COLONOSCOPY N/A 05/15/2015   Procedure: COLONOSCOPY;  Surgeon: West Bali, MD;  [facility];  Service: Endoscopy;  Laterality: N/A;  145 - moved to 1:30 - office to notify   COLONOSCOPY WITH PROPOFOL N/A 03/30/2021   Procedure: COLONOSCOPY WITH PROPOFOL;  Surgeon: Lanelle Bal, DO;  [facility];  Service: Endoscopy;  Laterality: N/A;  10:00 / ASA 2   POLYPECTOMY  03/30/2021   Procedure: POLYPECTOMY;  Surgeon: Lanelle Bal, DO;  [facility];  Service: Endoscopy;;    ROS: Review of Systems Negative except as stated above  PHYSICAL EXAM: BP 121/80 (BP Location: Left Arm, Patient Position: Sitting, Cuff Size: Normal)   Pulse 87   Ht 5\' 5"  (1.651 m)   Wt 160 lb 12.8 oz (72.9 kg)   SpO2 98%   BMI 26.76 kg/m   Physical Exam  General appearance - alert, well appearing, older male and in no distress Mental status - normal mood, behavior, speech, dress, motor activity, and thought processes Neck - supple, no significant adenopathy Chest -breath sounds mildly decreased with scattered wheezes.  Good air entry. Heart - normal rate, regular rhythm, normal S1, S2, no murmurs, rubs, clicks or gallops Extremities - peripheral pulses normal, no pedal edema, no clubbing or cyanosis     Latest Ref Rng & Units 05/27/2022   11:51 AM 05/20/2021   12:16 PM 04/01/2020   10:32 AM  CMP  Glucose 70 - 99 mg/dL 96  102  725   BUN 8 - 27 mg/dL 11  11  9    Creatinine 0.76 - 1.27 mg/dL 3.66  4.40  3.47   Sodium 134 - 144 mmol/L 140  144  141   Potassium 3.5 - 5.2 mmol/L 4.8  4.6  4.2   Chloride 96 - 106 mmol/L 105  105  102   CO2 20 - 29 mmol/L 24  23  26    Calcium 8.6 - 10.2 mg/dL 9.3  9.3  9.5   Total Protein 6.0 - 8.5 g/dL 6.6  6.9  6.7   Total Bilirubin 0.0 - 1.2 mg/dL 0.4  0.4  0.4   Alkaline Phos 44 - 121 IU/L 64  68  67   AST 0 - 40 IU/L 19  20  24    ALT 0 - 44 IU/L 16  17  26     Lipid Panel      Component Value Date/Time   CHOL 129 05/27/2022 1151   TRIG 101 05/27/2022 1151   HDL 45 05/27/2022 1151   CHOLHDL 2.9 05/27/2022 1151   CHOLHDL 3.5 03/10/2016 1412   VLDL 25 03/10/2016 1412   LDLCALC 65 05/27/2022 1151    CBC    Component Value Date/Time   WBC 6.2 05/27/2022 1151   WBC 5.6 11/12/2018 1227   RBC 5.09 05/27/2022 1151   RBC 4.77 11/12/2018 1227   HGB 16.2 05/27/2022 1151   HCT 47.5 05/27/2022 1151   PLT 239 05/27/2022 1151   MCV 93 05/27/2022 1151   MCH 31.8 05/27/2022 1151   MCH 32.9 11/12/2018 1227   MCHC 34.1 05/27/2022 1151   MCHC 34.2 11/12/2018 1227   RDW 12.7 05/27/2022 1151   LYMPHSABS 2.9 11/12/2018 1227   MONOABS 0.5 11/12/2018 1227   EOSABS 0.1 11/12/2018 1227   BASOSABS 0.0 11/12/2018 1227    ASSESSMENT AND PLAN: 1. Centrilobular emphysema (HCC) (Primary) Patient has scattered wheezes on exam.  I encouraged him to use the Spiriva daily as prescribed.  2. Tobacco dependence Patient wanting to quit smoking but finding it difficult.  He would like the nicotine patches but insurance does not cover.  I have given him the information for 1 800 quit NOW so that he can have someone call for him to request the nicotine patches and gum for free.  3. Hyperlipidemia, unspecified hyperlipidemia type Continue atorvastatin 20 mg daily.  4. Prediabetes Encouraged him to continue healthy eating habits and regular exercise. - POCT glycosylated hemoglobin (Hb A1C) - POCT glucose (  manual entry)   Patient was given the opportunity to ask questions.  Patient verbalized understanding of the plan and was able to repeat key elements of the plan.   This documentation was completed using Paediatric nurse.  Any transcriptional errors are unintentional.  Orders Placed This Encounter  Procedures   POCT glycosylated hemoglobin (Hb A1C)   POCT glucose (manual entry)     Requested Prescriptions    No prescriptions requested or ordered in  this encounter    Return in about 4 months (around 06/04/2023).  Jonah Blue, MD, FACP

## 2023-02-03 NOTE — Patient Instructions (Addendum)
I recommend using your inhaler every day as prescribed.  Call 1 800 quit now to request the nicotine patches for free.  Get COVID booster vaccine at your pharmacy.

## 2023-02-14 ENCOUNTER — Ambulatory Visit: Payer: Medicare Other | Admitting: Physical Medicine & Rehabilitation

## 2023-03-02 IMAGING — DX DG CHEST 2V
2 series · 2 of 2 positions shown · non-contrast
Comparison: CT chest 04/24/2020.  Chest x-ray 04/01/2020.

CLINICAL DATA: Wheezing.  Coughing.

EXAM:
CHEST - 2 VIEW

[chest pa]
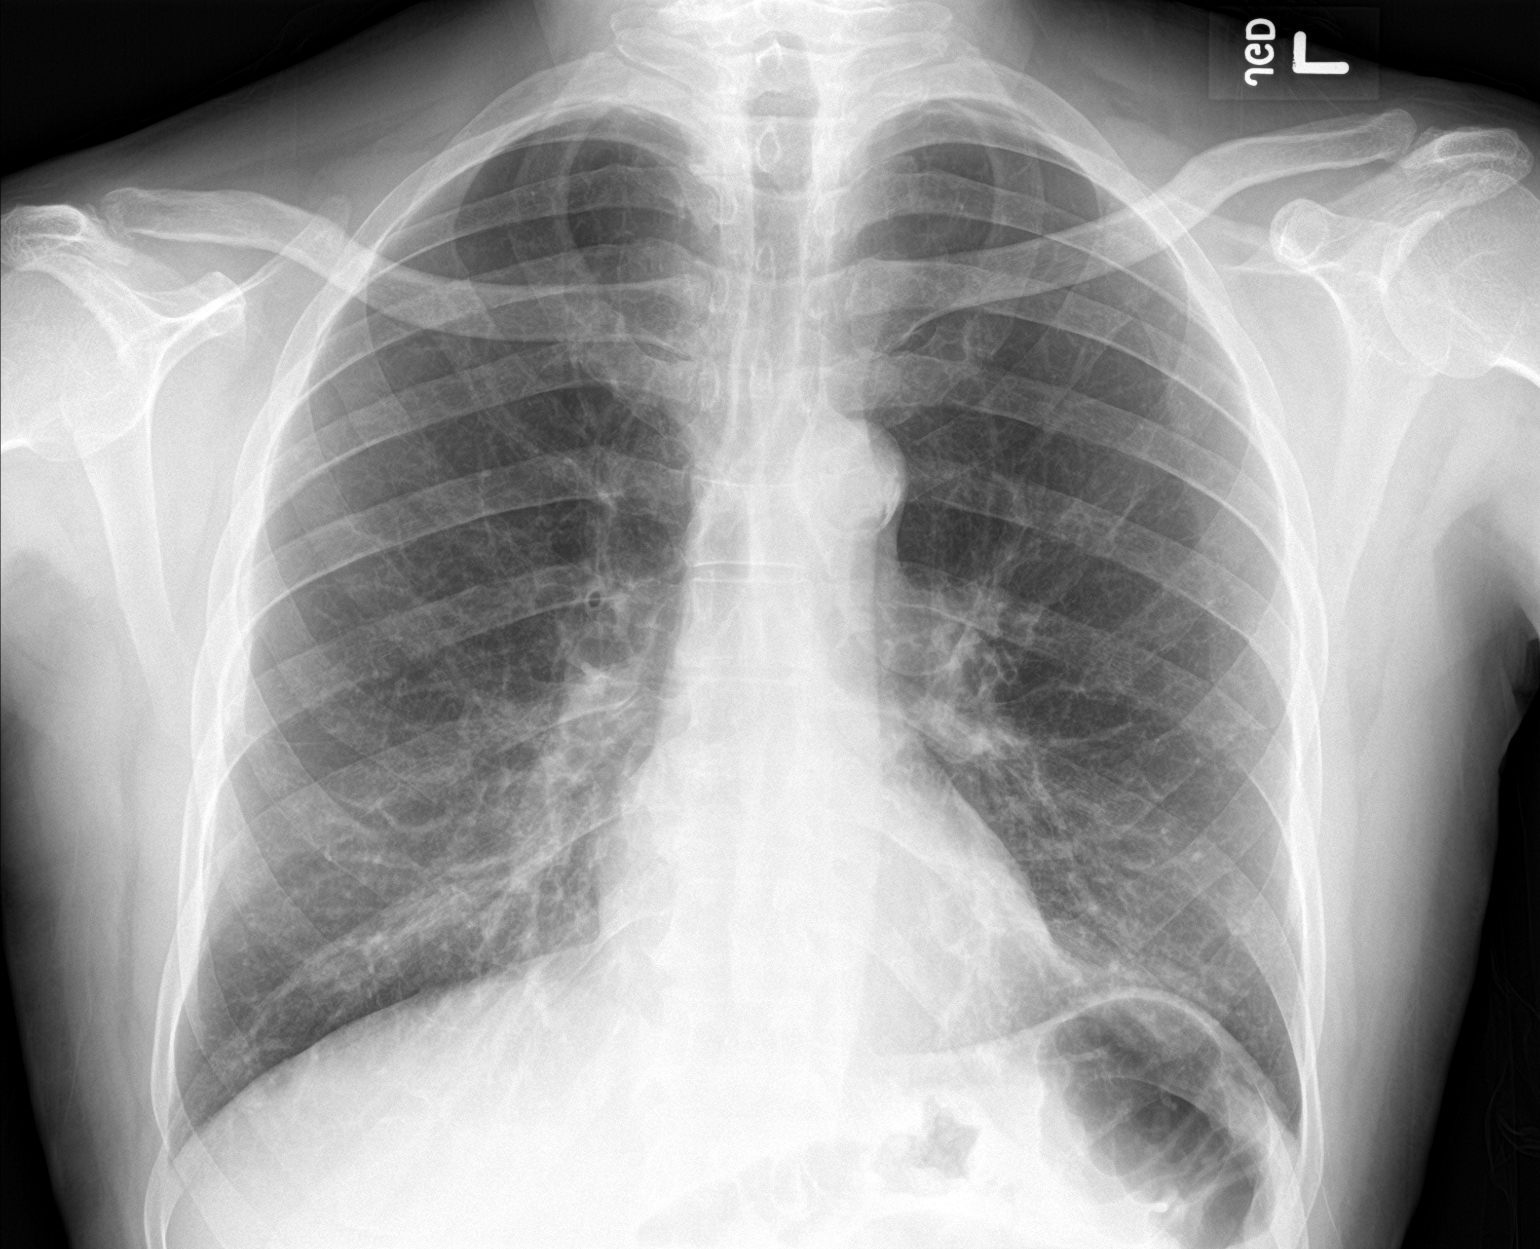

[chest lat]
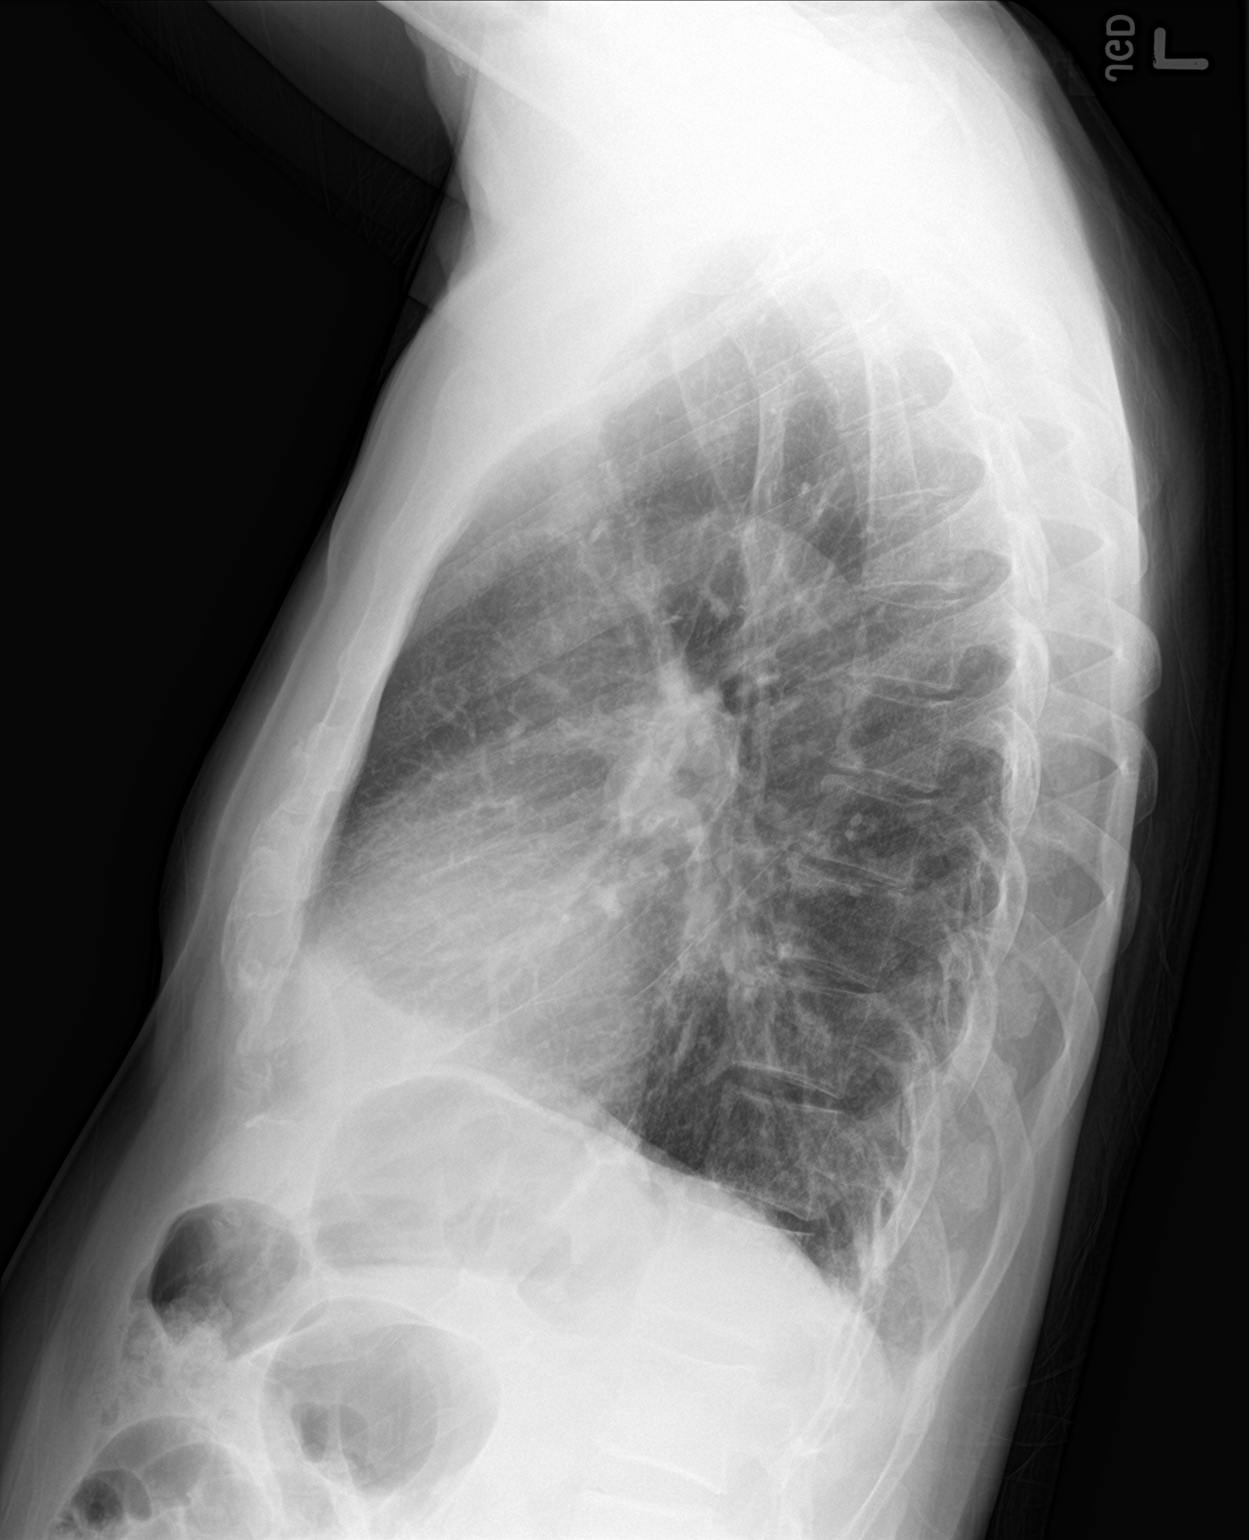

[2 of 2 positions shown; findings below may reference images not displayed]

FINDINGS: Mediastinum hilar structures normal. Mild peribronchial cuffing.
Bronchitis cannot be excluded. Mild bibasilar atelectasis. No
pleural effusion or pneumothorax.
IMPRESSION: Mild peribronchial cuffing. Bronchitis cannot be excluded. Mild
bibasilar atelectasis.

## 2023-03-06 ENCOUNTER — Other Ambulatory Visit: Payer: Self-pay

## 2023-04-06 ENCOUNTER — Other Ambulatory Visit: Payer: Self-pay | Admitting: Internal Medicine

## 2023-04-06 DIAGNOSIS — E785 Hyperlipidemia, unspecified: Secondary | ICD-10-CM

## 2023-04-06 DIAGNOSIS — M545 Low back pain, unspecified: Secondary | ICD-10-CM

## 2023-04-06 DIAGNOSIS — G8929 Other chronic pain: Secondary | ICD-10-CM

## 2023-05-20 ENCOUNTER — Telehealth: Payer: Self-pay | Admitting: Internal Medicine

## 2023-05-20 MED ORDER — INCRUSE ELLIPTA 62.5 MCG/ACT IN AEPB: 1.0000 | INHALATION_SPRAY | Freq: Every day | RESPIRATORY_TRACT | 6 refills | Status: AC

## 2023-05-20 NOTE — Telephone Encounter (Signed)
-----   Message from Weldon Picking sent at 05/11/2023  9:11 AM EDT ----- Sherrye Payor is now non-preferred on patient's insurance. Incruse is preferred, can the Spiriva be changed?

## 2023-06-06 ENCOUNTER — Encounter: Payer: Self-pay | Admitting: Internal Medicine

## 2023-06-06 ENCOUNTER — Ambulatory Visit: Payer: Medicare Other | Attending: Internal Medicine | Admitting: Internal Medicine

## 2023-06-06 VITALS — BP 117/74 | HR 86 | Temp 98.0°F | Ht 65.0 in | Wt 157.0 lb

## 2023-06-06 DIAGNOSIS — J432 Centrilobular emphysema: Secondary | ICD-10-CM | POA: Diagnosis not present

## 2023-06-06 DIAGNOSIS — R7303 Prediabetes: Secondary | ICD-10-CM | POA: Diagnosis not present

## 2023-06-06 DIAGNOSIS — E785 Hyperlipidemia, unspecified: Secondary | ICD-10-CM | POA: Diagnosis present

## 2023-06-06 DIAGNOSIS — J449 Chronic obstructive pulmonary disease, unspecified: Secondary | ICD-10-CM | POA: Insufficient documentation

## 2023-06-06 DIAGNOSIS — L853 Xerosis cutis: Secondary | ICD-10-CM

## 2023-06-06 DIAGNOSIS — Z87891 Personal history of nicotine dependence: Secondary | ICD-10-CM

## 2023-06-06 DIAGNOSIS — G8929 Other chronic pain: Secondary | ICD-10-CM

## 2023-06-06 DIAGNOSIS — Z79899 Other long term (current) drug therapy: Secondary | ICD-10-CM | POA: Insufficient documentation

## 2023-06-06 DIAGNOSIS — M545 Low back pain, unspecified: Secondary | ICD-10-CM | POA: Insufficient documentation

## 2023-06-06 MED ORDER — PROAIR HFA 108 (90 BASE) MCG/ACT IN AERS: 2.0000 | INHALATION_SPRAY | Freq: Four times a day (QID) | RESPIRATORY_TRACT | 3 refills | Status: DC | PRN

## 2023-06-06 MED ORDER — TRAMADOL HCL 50 MG PO TABS: 50.0000 mg | ORAL_TABLET | Freq: Every day | ORAL | 2 refills | Status: DC | PRN

## 2023-06-06 NOTE — Progress Notes (Signed)
 Patient ID: Nathaniel Robinson, male    DOB: 07-21-1955  MRN: [id number]  CC: Hyperlipidemia (Hyperlipiedmia /Daily itching of bilateral legs X8 months /Requesting refill for Proair -HFA / previously received Incruse Ellipta )   Subjective: [name] is a 68 y.o. male who presents for chronic ds management. His concerns today include:  Pt with hx of HL, PreDM, chronic LBP, GERD, peripheral neuropathy, former smoker, centrilobular emphysema, lung nodules (due for repeat 03/2021), aortic and coronary Ca+, elev PSA (neg bx 02/2019), PreDM    AMN Language interpreter used during this encounter. # Beth [id number]  Has obtained Medicare since last visit.  Will need Welcome MWV   HL: Taking and tolerating Lipitor 20 mg daily   Tob dep: quit since I last saw him 4 mths ago  COPD: using the Spiriva  daily. Now has Incruse and not sure how to use it.   No SOB or chronic cough. Wants RF on ProAir  HFA inhaler. Out x 1 yr  C/o itching of lower  legs above ankles x 8 mths.  No initiating factors or ideas on what may be causing it. No rash. Does not work in yard a lot.   He continues to take tramadol  as needed without significant side effects. Rates pain 5/10 in lower back most of the times.  Tramadol  helpful. He is up-to-date with his controlled substance prescribing agreement and urine drug screen.  Patient Active Problem List   Diagnosis Date Noted   Centrilobular emphysema (HCC) 05/27/2022   Aortic atherosclerosis (HCC) 05/20/2021   ED (erectile dysfunction) 01/27/2021   Controlled substance agreement signed 09/17/2020   Lung nodules 05/18/2020   Atherosclerosis of native coronary artery of native heart without angina pectoris 05/18/2020   Elevated PSA 12/26/2018   Flu vaccine need 10/08/2018   Spondylosis without myelopathy or radiculopathy, lumbar region 09/14/2018   Tobacco dependence 06/01/2018   Paresthesia 01/02/2018   Gastroesophageal reflux disease without esophagitis 09/15/2017    Idiopathic peripheral neuropathy 09/15/2017   Chronic lumbar radiculopathy 02/10/2017   Hyperlipidemia 03/14/2016   Lumbar degenerative disc disease 09/11/2015   Chronic pain syndrome 09/11/2015   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     traMADol  (ULTRAM ) 50 MG tablet Take 1 tablet (50 mg total) by mouth daily as needed. 30 tablet 2   umeclidinium bromide  (INCRUSE ELLIPTA ) 62.5 MCG/ACT AEPB Inhale 1 puff into the lungs daily. 30 each 6   No current facility-administered medications on file prior to visit.    No Known Allergies  Social History   Socioeconomic History   Marital status: Married    Spouse name: Not on file   Number of children: 4   Years of education: come college   Highest education level: Not on file  Occupational History   Occupation: disability    Comment: [place]   Tobacco Use   Smoking status: Some Days    Current packs/day: 0.00    Types: Cigarettes    Last attempt to quit: 05/15/2020    Years since quitting: 3.0   Smokeless tobacco: Never  Vaping Use   Vaping status: Never Used  Substance and Sexual Activity   Alcohol use: Yes  Alcohol/week: 0.0 standard drinks of alcohol    Comment: social    Drug use: No   Sexual activity: Never  Other Topics Concern   Not on file  Social History Narrative   Form [place]   Moved to [place]  in 01/2014    Lives with roommate.   Right-handed.   No caffeine use.   Wife and 4 children in [place].    Social Drivers of Corporate investment banker Strain: Low Risk  (02/03/2023)   Overall Financial Resource Strain (CARDIA)    Difficulty of Paying Living Expenses: Not very hard  Food Insecurity: No Food Insecurity  (02/03/2023)   Hunger Vital Sign    Worried About Running Out of Food in the Last Year: Never true    Ran Out of Food in the Last Year: Never true  Transportation Needs: No Transportation Needs (02/03/2023)   PRAPARE - Administrator, Civil Service (Medical): No    Lack of Transportation (Non-Medical): No  Physical Activity: Insufficiently Active (02/03/2023)   Exercise Vital Sign    Days of Exercise per Week: 1 day    Minutes of Exercise per Session: 60 min  Stress: No Stress Concern Present (02/03/2023)   [employer] of Occupational Health - Occupational Stress Questionnaire    Feeling of Stress : Not at all  Social Connections: Moderately Isolated (02/03/2023)   Social Connection and Isolation Panel [NHANES]    Frequency of Communication with Friends and Family: Twice a week    Frequency of Social Gatherings with Friends and Family: Twice a week    Attends Religious Services: Never    Database administrator or Organizations: No    Attends Banker Meetings: Never    Marital Status: Married  Catering manager Violence: Not At Risk (02/03/2023)   Humiliation, Afraid, Rape, and Kick questionnaire    Fear of Current or Ex-Partner: No    Emotionally Abused: No    Physically Abused: No    Sexually Abused: No    Family History  Problem Relation Age of Onset   Ovarian cancer Mother    Other Father        unsure of medical history   Heart disease Neg Hx    Hypertension Neg Hx    Diabetes Neg Hx    Colon cancer Neg Hx     Past Surgical History:  Procedure Laterality Date   COLONOSCOPY N/A 05/15/2015   Procedure: COLONOSCOPY;  Surgeon: Alyce Jubilee, MD;  [facility];  Service: Endoscopy;  Laterality: N/A;  145 - moved to 1:30 - office to notify   COLONOSCOPY WITH PROPOFOL  N/A 03/30/2021   Procedure: COLONOSCOPY WITH PROPOFOL ;  Surgeon: Vinetta Greening, DO;  [facility];  Service: Endoscopy;  Laterality: N/A;  10:00 / ASA 2    POLYPECTOMY  03/30/2021   Procedure: POLYPECTOMY;  Surgeon: Vinetta Greening, DO;  [facility];  Service: Endoscopy;;    ROS: Review of Systems Negative except as stated above  PHYSICAL EXAM: BP 117/74 (BP Location: Left Arm, Patient Position: Sitting, Cuff Size: Normal)   Pulse 86   Temp 98 F (36.7 C) (Oral)   Ht 5\' 5"  (1.651 m)   Wt 157 lb (71.2 kg)   SpO2 95%   BMI 26.13 kg/m   Physical Exam   General appearance - alert, well appearing, and in no distress Mental status - normal mood, behavior, speech, dress, motor activity, and thought processes  Neck - supple, no significant adenopathy Chest - clear to auscultation, no wheezes, rales or rhonchi, symmetric air entry Heart - normal rate, regular rhythm, normal S1, S2, no murmurs, rubs, clicks or gallops Extremities - peripheral pulses normal, no pedal edema, no clubbing or cyanosis Skin -skin on the lower third of both lower legs very dry.  No rash seen.     Latest Ref Rng & Units 05/27/2022   11:51 AM 05/20/2021   12:16 PM 04/01/2020   10:32 AM  CMP  Glucose 70 - 99 mg/dL 96  161  096   BUN 8 - 27 mg/dL 11  11  9    Creatinine 0.76 - 1.27 mg/dL 0.45  4.09  8.11   Sodium 134 - 144 mmol/L 140  144  141   Potassium 3.5 - 5.2 mmol/L 4.8  4.6  4.2   Chloride 96 - 106 mmol/L 105  105  102   CO2 20 - 29 mmol/L 24  23  26    Calcium  8.6 - 10.2 mg/dL 9.3  9.3  9.5   Total Protein 6.0 - 8.5 g/dL 6.6  6.9  6.7   Total Bilirubin 0.0 - 1.2 mg/dL 0.4  0.4  0.4   Alkaline Phos 44 - 121 IU/L 64  68  67   AST 0 - 40 IU/L 19  20  24    ALT 0 - 44 IU/L 16  17  26     Lipid Panel     Component Value Date/Time   CHOL 129 05/27/2022 1151   TRIG 101 05/27/2022 1151   HDL 45 05/27/2022 1151   CHOLHDL 2.9 05/27/2022 1151   CHOLHDL 3.5 03/10/2016 1412   VLDL 25 03/10/2016 1412   LDLCALC 65 05/27/2022 1151    CBC    Component Value Date/Time   WBC 6.2 05/27/2022 1151   WBC 5.6 11/12/2018 1227   RBC 5.09 05/27/2022  1151   RBC 4.77 11/12/2018 1227   HGB 16.2 05/27/2022 1151   HCT 47.5 05/27/2022 1151   PLT 239 05/27/2022 1151   MCV 93 05/27/2022 1151   MCH 31.8 05/27/2022 1151   MCH 32.9 11/12/2018 1227   MCHC 34.1 05/27/2022 1151   MCHC 34.2 11/12/2018 1227   RDW 12.7 05/27/2022 1151   LYMPHSABS 2.9 11/12/2018 1227   MONOABS 0.5 11/12/2018 1227   EOSABS 0.1 11/12/2018 1227   BASOSABS 0.0 11/12/2018 1227    ASSESSMENT AND PLAN: 1. Hyperlipidemia, unspecified hyperlipidemia type (Primary) At goal.  Continue Lipitor  2. Chronic left-sided low back pain without sciatica Stable. Benefits from use of Tramadol  and has remained functional. NCCSRS reviewed and is appropriate - traMADol  (ULTRAM ) 50 MG tablet; Take 1 tablet (50 mg total) by mouth daily as needed.  Dispense: 30 tablet; Refill: 2  3. Centrilobular emphysema (HCC) Refill sent on ProAir .  I went over the difference between the ProAir  and the Incruse inhaler.  Encouraged him to use the Incruse every day and the ProAir  only as needed.  Clinical pharmacist, Van Gelinas, did meet with him today to instruct on proper use of the Incruse inhaler. - PROAIR  HFA 108 (90 Base) MCG/ACT inhaler; Inhale 2 puffs into the lungs every 6 (six) hours as needed for wheezing or shortness of breath.  Dispense: 18 g; Refill: 3  4. Dry skin Advised patient to purchase Eucerin intensive care lotion or Aveeno intensive care lotion over-the-counter and use twice a day.  5. Former smoker Commended him on quitting.  Encouraged him to remain  tobacco free.    Patient was given the opportunity to ask questions.  Patient verbalized understanding of the plan and was able to repeat key elements of the plan.   This documentation was completed using Paediatric nurse.  Any transcriptional errors are unintentional.  No orders of the defined types were placed in this encounter.    Requested Prescriptions   Pending Prescriptions Disp Refills   traMADol   (ULTRAM ) 50 MG tablet 30 tablet 2    Sig: Take 1 tablet (50 mg total) by mouth daily as needed.    No follow-ups on file.  Concetta Dee, MD, FACP

## 2023-06-08 ENCOUNTER — Telehealth: Payer: Self-pay | Admitting: Internal Medicine

## 2023-06-08 MED ORDER — ALBUTEROL SULFATE HFA 108 (90 BASE) MCG/ACT IN AERS: 2.0000 | INHALATION_SPRAY | Freq: Four times a day (QID) | RESPIRATORY_TRACT | 6 refills | Status: DC | PRN

## 2023-06-08 NOTE — Addendum Note (Signed)
 Addended by: Freada Jacobs, Mara Seminole L on: 06/08/2023 01:47 PM   Modules accepted: Orders

## 2023-06-08 NOTE — Telephone Encounter (Signed)
 Prescription sent for Ventolin  to patient's pharmacy.

## 2023-06-08 NOTE — Telephone Encounter (Signed)
 Copied from CRM [id number]. Topic: Clinical - Prescription Issue >> Jun 08, 2023 12:53 PM Juluis Ok wrote: Reason for CRM: Hetel with [employer] states that the [employer] is not available, but a prescription can be sent for the Ventolin  Inhaler. She states that an electronic prescription can be sent.   [id number] Jonette Nestle, [place] - [address] AT [facility] [address] [place] [address] Phone: [phone] Fax: [phone] Hours: Not open 24 hours

## 2023-08-04 ENCOUNTER — Ambulatory Visit (HOSPITAL_COMMUNITY)
Admission: EM | Admit: 2023-08-04 | Discharge: 2023-08-04 | Disposition: A | Discharge status: Home / Self Care | Attending: Family Medicine | Admitting: Family Medicine

## 2023-08-04 ENCOUNTER — Encounter (HOSPITAL_COMMUNITY): Payer: Self-pay

## 2023-08-04 ENCOUNTER — Ambulatory Visit (HOSPITAL_COMMUNITY): Payer: Self-pay

## 2023-08-04 DIAGNOSIS — R58 Hemorrhage, not elsewhere classified: Secondary | ICD-10-CM | POA: Insufficient documentation

## 2023-08-04 DIAGNOSIS — L03116 Cellulitis of left lower limb: Secondary | ICD-10-CM | POA: Diagnosis not present

## 2023-08-04 DIAGNOSIS — Z87891 Personal history of nicotine dependence: Secondary | ICD-10-CM | POA: Diagnosis not present

## 2023-08-04 DIAGNOSIS — M25559 Pain in unspecified hip: Secondary | ICD-10-CM | POA: Diagnosis present

## 2023-08-04 DIAGNOSIS — M79652 Pain in left thigh: Secondary | ICD-10-CM | POA: Insufficient documentation

## 2023-08-04 DIAGNOSIS — L539 Erythematous condition, unspecified: Secondary | ICD-10-CM | POA: Diagnosis not present

## 2023-08-04 LAB — COMPREHENSIVE METABOLIC PANEL WITH GFR
ALT: 20 U/L (ref 0–44)
AST: 21 U/L (ref 15–41)
Albumin: 3.4 g/dL — ABNORMAL LOW (ref 3.5–5.0)
Alkaline Phosphatase: 45 U/L (ref 38–126)
Anion gap: 8 (ref 5–15)
BUN: 10 mg/dL (ref 8–23)
CO2: 24 mmol/L (ref 22–32)
Calcium: 9.1 mg/dL (ref 8.9–10.3)
Chloride: 106 mmol/L (ref 98–111)
Creatinine, Ser: 0.75 mg/dL (ref 0.61–1.24)
GFR, Estimated: >60 mL/min (ref >60)
Glucose, Bld: 120 mg/dL — ABNORMAL HIGH (ref 70–99)
Potassium: 3.8 mmol/L (ref 3.5–5.1)
Sodium: 138 mmol/L (ref 135–145)
Total Bilirubin: 0.6 mg/dL (ref 0.0–1.2)
Total Protein: 6.9 g/dL (ref 6.5–8.1)

## 2023-08-04 LAB — CBC WITH DIFFERENTIAL/PLATELET
Abs Immature Granulocytes: 0 10*3/uL (ref 0.00–0.07)
Basophils Absolute: 0 10*3/uL (ref 0.0–0.1)
Basophils Relative: 1 %
Eosinophils Absolute: 0.2 10*3/uL (ref 0.0–0.5)
Eosinophils Relative: 4 %
HCT: 46.9 % (ref 39.0–52.0)
Hemoglobin: 15.8 g/dL (ref 13.0–17.0)
Immature Granulocytes: 0 %
Lymphocytes Relative: 52 %
Lymphs Abs: 2.9 10*3/uL (ref 0.7–4.0)
MCH: 30.8 pg (ref 26.0–34.0)
MCHC: 33.7 g/dL (ref 30.0–36.0)
MCV: 91.4 fL (ref 80.0–100.0)
Monocytes Absolute: 0.5 10*3/uL (ref 0.1–1.0)
Monocytes Relative: 9 %
Neutro Abs: 1.8 10*3/uL (ref 1.7–7.7)
Neutrophils Relative %: 34 %
Platelets: 210 10*3/uL (ref 150–400)
RBC: 5.13 MIL/uL (ref 4.22–5.81)
RDW: 13.1 % (ref 11.5–15.5)
WBC: 5.4 10*3/uL (ref 4.0–10.5)
nRBC: 0 % (ref 0.0–0.2)

## 2023-08-04 LAB — PROTIME-INR
INR: 1.1 (ref 0.8–1.2)
Prothrombin Time: 14.3 s (ref 11.4–15.2)

## 2023-08-04 LAB — APTT: aPTT: 29 s (ref 24–36)

## 2023-08-04 MED ORDER — CEPHALEXIN 500 MG PO CAPS: 500.0000 mg | ORAL_CAPSULE | Freq: Two times a day (BID) | ORAL | 0 refills | Status: AC

## 2023-08-04 NOTE — ED Triage Notes (Signed)
 Patient here today with c/o bruising on the left buttock yesterday. Patient states that he has some tenderness but not having any pain walking.

## 2023-08-04 NOTE — Discharge Instructions (Addendum)
 Take cephalexin 500 mg--1 capsule 2 times daily for 7 days  We have drawn blood to check your blood counts and clotting tests, and also kidney and liver function numbers.  We will notify you if anything is significantly abnormal.  Please follow-up with your primary care about this issue  Please go to the emergency room if you are feeling worse in any way.  (?? ???? 30 ????? ????? ?????? ???  ?????? 20 ??--2 ????? ?5 ????? ????  ?????? ?????--????? ???? ?? ????? ?? ?????? ?? ??? ??? 4 ???? 2 ???? ???   ?????? ????? ?? ?????? ?? ??? ??? 8 ???? ????? 4 ??--1 ???? ?? ????? ?????? ???? ???? ?????)

## 2023-08-04 NOTE — ED Provider Notes (Signed)
 [facility]    CSN: [id number] Arrival date & time: 08/04/23  1044      History   Chief Complaint Chief Complaint  Patient presents with   Hip Pain    HPI Nathaniel Robinson is a 68 y.o. male.   Here for some discoloration on his left proximal posterior and lateral.  He first noted it yesterday.  He states he is not really having any pain there and is not radiating.  No fever or chills.  No recent trauma or fall.  NKDA    Past Medical History:  Diagnosis Date   Chronic low back pain 2010    GERD (gastroesophageal reflux disease)     Patient Active Problem List   Diagnosis Date Noted   Centrilobular emphysema (HCC) 05/27/2022   Aortic atherosclerosis (HCC) 05/20/2021   ED (erectile dysfunction) 01/27/2021   Controlled substance agreement signed 09/17/2020   Lung nodules 05/18/2020   Atherosclerosis of native coronary artery of native heart without angina pectoris 05/18/2020   Elevated PSA 12/26/2018   Flu vaccine need 10/08/2018   Spondylosis without myelopathy or radiculopathy, lumbar region 09/14/2018   Tobacco dependence 06/01/2018   Paresthesia 01/02/2018   Gastroesophageal reflux disease without esophagitis 09/15/2017   Idiopathic peripheral neuropathy 09/15/2017   Chronic lumbar radiculopathy 02/10/2017   Hyperlipidemia 03/14/2016   Lumbar degenerative disc disease 09/11/2015   Chronic pain syndrome 09/11/2015   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   Chronic low back pain     Past Surgical History:  Procedure Laterality Date   COLONOSCOPY N/A 05/15/2015   Procedure: COLONOSCOPY;  Surgeon: Alyce Jubilee, MD;  [facility];  Service: Endoscopy;  Laterality: N/A;  145 - moved to 1:30 - office to notify   COLONOSCOPY WITH PROPOFOL  N/A 03/30/2021   Procedure: COLONOSCOPY WITH PROPOFOL ;  Surgeon: Vinetta Greening, DO;  [facility];  Service: Endoscopy;  Laterality: N/A;  10:00 / ASA 2   POLYPECTOMY   03/30/2021   Procedure: POLYPECTOMY;  Surgeon: Vinetta Greening, DO;  [facility];  Service: Endoscopy;;       Home Medications    Prior to Admission medications   Medication Sig Start Date End Date Taking? Authorizing Provider  cephALEXin (KEFLEX) 500 MG capsule Take 1 capsule (500 mg total) by mouth 2 (two) times daily for 7 days. 08/04/23 08/11/23 Yes Ann Keto, MD  albuterol  (VENTOLIN  HFA) 108 (90 Base) MCG/ACT inhaler Inhale 2 puffs into the lungs every 6 (six) hours as needed for wheezing or shortness of breath. 06/08/23   Lawrance Presume, MD  atorvastatin  (LIPITOR) 20 MG tablet TAKE 1 TABLET BY MOUTH DAILY 04/07/23   Lawrance Presume, MD  tamsulosin (FLOMAX) 0.4 MG CAPS capsule Take 0.4 mg by mouth daily.    [provider]  traMADol  (ULTRAM ) 50 MG tablet Take 1 tablet (50 mg total) by mouth daily as needed. 06/06/23   Lawrance Presume, MD  umeclidinium bromide  (INCRUSE ELLIPTA ) 62.5 MCG/ACT AEPB Inhale 1 puff into the lungs daily. 05/20/23   Lawrance Presume, MD    Family History Family History  Problem Relation Age of Onset   Ovarian cancer Mother    Other Father        unsure of medical history   Heart disease Neg Hx    Hypertension Neg Hx    Diabetes Neg Hx    Colon cancer Neg Hx     Social  History Social History   Tobacco Use   Smoking status: Former    Current packs/day: 0.00    Types: Cigarettes    Quit date: 05/15/2020    Years since quitting: 3.2   Smokeless tobacco: Never  Vaping Use   Vaping status: Never Used  Substance Use Topics   Alcohol use: Yes    Comment: social, rare   Drug use: No     Allergies   Patient has no known allergies.   Review of Systems Review of Systems   Physical Exam Triage Vital Signs ED Triage Vitals  Encounter Vitals Group     BP 08/04/23 1104 128/73     Girls Systolic BP Percentile --      Girls Diastolic BP Percentile --      Boys Systolic BP Percentile --      Boys Diastolic  BP Percentile --      Pulse Rate 08/04/23 1104 92     Resp 08/04/23 1104 16     Temp 08/04/23 1104 98.3 F (36.8 C)     Temp Source 08/04/23 1104 Oral     SpO2 08/04/23 1104 94 %     Weight --      Height --      Head Circumference --      Peak Flow --      Pain Score 08/04/23 1106 1     Pain Loc --      Pain Education --      Exclude from Growth Chart --    No data found.  Updated Vital Signs BP 128/73 (BP Location: Left Arm)   Pulse 92   Temp 98.3 F (36.8 C) (Oral)   Resp 16   SpO2 94%   Visual Acuity Right Eye Distance:   Left Eye Distance:   Bilateral Distance:    Right Eye Near:   Left Eye Near:    Bilateral Near:     Physical Exam Vitals reviewed.  Constitutional:      General: He is not in acute distress.    Appearance: He is not toxic-appearing.   Skin:    Coloration: Skin is not jaundiced or pale.     Comments: There is an area of mild tenderness and dark erythematous discoloration on the proximal left thigh in the posterior lateral distribution.  It is about 8 cm x 6 cm.  There is no fluctuance or induration.     Neurological:     Mental Status: He is alert and oriented to person, place, and time.   Psychiatric:        Behavior: Behavior normal.      UC Treatments / Results  Labs (all labs ordered are listed, but only abnormal results are displayed) Labs Reviewed  CBC WITH DIFFERENTIAL/PLATELET  COMPREHENSIVE METABOLIC PANEL WITH GFR  PROTIME-INR  APTT    EKG   Radiology No results found.  Procedures Procedures (including critical care time)  Medications Ordered in UC Medications - No data to display  Initial Impression / Assessment and Plan / UC Course  I have reviewed the triage vital signs and the nursing notes.  Pertinent labs & imaging results that were available during my care of the patient were reviewed by me and considered in my medical decision making (see chart for details).     Tigrinian interpretation is used  for the visit.  Keflex is sent in for possible cellulitis.  I discussed with the patient that it is not completely typical  for cellulitis, so a CBC, pro time and APTT are drawn today along with a CMP.  (Since this could be actually bruising instead of erythema) If it anything is significantly abnormal we will notify him.  I have asked him to follow-up with his primary care also.  Final Clinical Impressions(s) / UC Diagnoses   Final diagnoses:  None     Discharge Instructions      Take cephalexin 500 mg--1 capsule 2 times daily for 7 days  We have drawn blood to check your blood counts and clotting tests, and also kidney and liver function numbers.  We will notify you if anything is significantly abnormal.  Please follow-up with your primary care about this issue  Please go to the emergency room if you are feeling worse in any way.  (?? ???? 30 ????? ????? ?????? ???  ?????? 20 ??--2 ????? ?5 ????? ????  ?????? ?????--????? ???? ?? ????? ?? ?????? ?? ??? ??? 4 ???? 2 ???? ???   ?????? ????? ?? ?????? ?? ??? ??? 8 ???? ????? 4 ??--1 ???? ?? ????? ?????? ???? ???? ?????)      ED Prescriptions     Medication Sig Dispense Auth. Provider   cephALEXin (KEFLEX) 500 MG capsule Take 1 capsule (500 mg total) by mouth 2 (two) times daily for 7 days. 14 capsule Shamila Lerch K, MD      I have reviewed the PDMP during this encounter.   Ann Keto, MD 08/04/23 [id number]

## 2023-08-07 ENCOUNTER — Ambulatory Visit: Attending: Internal Medicine | Admitting: Internal Medicine

## 2023-08-07 ENCOUNTER — Encounter: Payer: Self-pay | Admitting: Internal Medicine

## 2023-08-07 VITALS — BP 109/70 | HR 87 | Temp 98.8°F | Ht 65.0 in | Wt 155.0 lb

## 2023-08-07 DIAGNOSIS — Z0000 Encounter for general adult medical examination without abnormal findings: Secondary | ICD-10-CM | POA: Diagnosis not present

## 2023-08-07 DIAGNOSIS — Z7189 Other specified counseling: Secondary | ICD-10-CM | POA: Diagnosis not present

## 2023-08-07 DIAGNOSIS — F99 Mental disorder, not otherwise specified: Secondary | ICD-10-CM | POA: Diagnosis not present

## 2023-08-07 DIAGNOSIS — L309 Dermatitis, unspecified: Secondary | ICD-10-CM

## 2023-08-07 DIAGNOSIS — M545 Low back pain, unspecified: Secondary | ICD-10-CM

## 2023-08-07 DIAGNOSIS — G8929 Other chronic pain: Secondary | ICD-10-CM

## 2023-08-07 DIAGNOSIS — H547 Unspecified visual loss: Secondary | ICD-10-CM

## 2023-08-07 MED ORDER — ALBUTEROL SULFATE HFA 108 (90 BASE) MCG/ACT IN AERS: 2.0000 | INHALATION_SPRAY | Freq: Four times a day (QID) | RESPIRATORY_TRACT | 6 refills | Status: AC | PRN

## 2023-08-07 MED ORDER — TRAMADOL HCL 50 MG PO TABS: 50.0000 mg | ORAL_TABLET | Freq: Every day | ORAL | 2 refills | Status: DC | PRN

## 2023-08-07 MED ORDER — ATORVASTATIN CALCIUM 20 MG PO TABS: 20.0000 mg | ORAL_TABLET | Freq: Every day | ORAL | 1 refills | Status: DC

## 2023-08-07 NOTE — Progress Notes (Signed)
 Subjective:    Nathaniel Robinson is a 68 y.o. male who presents for a Welcome to Medicare exam.  Pt with hx of HL, PreDM, chronic LBP, GERD, peripheral neuropathy, former smoker, centrilobular emphysema, lung nodules (due for repeat 03/2021), aortic and coronary Ca+, elev PSA (neg bx 02/2019), PreDM   [employer] Language interpreter used during this encounter. #[id number]   Cardiac Risk Factors include: advanced age (>63men, >37 women);male gender  ROS:  saw at UC last wk for rash LT lower buttock.  No pain and did not itch. Given abx Keflex  for possible cellulitis. Filled the rxn and taking today is 3rd day.  Getting better.  Reports rash on LT lower leg no better with steroid cream Also has a rash on the left lower leg laterally that we had given a steroid cream for in the past.  Patient states that this did not resolve the issue.    Objective:    Today's Vitals   08/07/23 1339  BP: 109/70  Pulse: 87  Temp: 98.8 F (37.1 C)  TempSrc: Oral  SpO2: 96%  Weight: 155 lb (70.3 kg)  Height: 5' 5 (1.651 m)  PainSc: 5    Body mass index is 25.79 kg/m. General: Patient in NAD CVS: Regular rate rhythm Chest: Few scattered wheezes Skin: Skin below the left buttock on the upper posterior thigh appears dry and a little scaly. Area on left lower leg laterally mid calf: Macular slightly hyperpigmented area  Medications Outpatient Encounter Medications as of 08/07/2023  Medication Sig   albuterol  (VENTOLIN  HFA) 108 (90 Base) MCG/ACT inhaler Inhale 2 puffs into the lungs every 6 (six) hours as needed for wheezing or shortness of breath.   atorvastatin  (LIPITOR) 20 MG tablet TAKE 1 TABLET BY MOUTH DAILY   cephALEXin  (KEFLEX ) 500 MG capsule Take 1 capsule (500 mg total) by mouth 2 (two) times daily for 7 days.   tamsulosin (FLOMAX) 0.4 MG CAPS capsule Take 0.4 mg by mouth daily.   traMADol  (ULTRAM ) 50 MG tablet Take 1 tablet (50 mg total) by mouth daily as needed.   umeclidinium bromide  (INCRUSE  ELLIPTA) 62.5 MCG/ACT AEPB Inhale 1 puff into the lungs daily.   No facility-administered encounter medications on file as of 08/07/2023.     History: Past Medical History:  Diagnosis Date   Chronic low back pain 2010    GERD (gastroesophageal reflux disease)    Past Surgical History:  Procedure Laterality Date   COLONOSCOPY N/A 05/15/2015   Procedure: COLONOSCOPY;  Surgeon: Margo LITTIE Haddock, MD;  [facility];  Service: Endoscopy;  Laterality: N/A;  145 - moved to 1:30 - office to notify   COLONOSCOPY WITH PROPOFOL  N/A 03/30/2021   Procedure: COLONOSCOPY WITH PROPOFOL ;  Surgeon: Cindie Carlin POUR, DO;  [facility];  Service: Endoscopy;  Laterality: N/A;  10:00 / ASA 2   POLYPECTOMY  03/30/2021   Procedure: POLYPECTOMY;  Surgeon: Cindie Carlin POUR, DO;  [facility];  Service: Endoscopy;;    Family History  Problem Relation Age of Onset   Ovarian cancer Mother    Other Father        unsure of medical history   Heart disease Neg Hx    Hypertension Neg Hx    Diabetes Neg Hx    Colon cancer Neg Hx    Social History   Occupational History   Occupation: disability    Comment: [place]   Tobacco Use   Smoking status: Former  Current packs/day: 0.00    Types: Cigarettes    Quit date: 05/15/2020    Years since quitting: 3.2   Smokeless tobacco: Never  Vaping Use   Vaping status: Never Used  Substance and Sexual Activity   Alcohol use: Yes    Comment: social, rare   Drug use: No   Sexual activity: Never    Tobacco Counseling Pt has remained free of cigarettes  Immunizations and Health Maintenance Immunization History  Administered Date(s) Administered   Fluad Quad(high Dose 65+) 11/04/2022   Hepatitis B 11/14/2013, 12/12/2013, 03/12/2014   IPV 11/14/2013   Influenza,inj,Quad PF,6+ Mos 11/28/2014, 10/29/2015, 11/17/2016, 10/31/2017, 10/08/2018   Influenza-Unspecified 11/15/2019, 11/14/2020, 12/26/2021   Janssen (J&J) SARS-COV-2  Vaccination 05/13/2019, 04/16/2020   MMR 11/14/2013, 11/23/2013   PNEUMOCOCCAL CONJUGATE-20 01/18/2021   Respiratory Syncytial Virus Vaccine ,Recomb Aduvanted(Arexvy ) 01/25/2022   Td 06/12/2013, 11/14/2013, 12/12/2013   Tdap 06/13/2014   Zoster Recombinant(Shingrix ) 05/20/2021, 01/25/2022   Health Maintenance Due  Topic Date Due   COVID-19 Vaccine (3 - 2024-25 season) 10/16/2022  Will plan to get COVID booster in the fall of this year  Activities of Daily Living    08/07/2023    1:42 PM  In your present state of health, do you have any difficulty performing the following activities:  Hearing? 0  Vision? 0  Difficulty concentrating or making decisions? 0  Walking or climbing stairs? 1  Comment Some difficulty when back pain is present  Dressing or bathing? 0  Doing errands, shopping? 0  Preparing Food and eating ? N  Using the Toilet? N  In the past six months, have you accidently leaked urine? N  Do you have problems with loss of bowel control? N  Managing your Medications? N  Managing your Finances? N  Housekeeping or managing your Housekeeping? N    Physical Exam   Physical Exam (optional), or other factors deemed appropriate based on the beneficiary's medical and social history and current clinical standards.   Advanced Directives: Does Patient Have a Medical Advance Directive?: No Would patient like information on creating a medical advance directive?: Yes (MAU/Ambulatory/Procedural Areas - Information given)   EKG:  Pt decline due to having EKG already on chart     Assessment:    This is a routine wellness  examination for this patient .   Vision/Hearing screen Vision Screening   Right eye Left eye Both eyes  Without correction 20/15 20/100 20/15  With correction     Does not wear glasses.  Last eye exam 5 yrs ago Hearing for conversation seems normal  Goals   None    Reports no health goal at this time  Depression Screen    08/07/2023    1:41 PM  06/06/2023    9:51 AM 02/03/2023   10:43 AM 01/26/2023   11:05 AM  PHQ 2/9 Scores  PHQ - 2 Score 0 0 0 0  PHQ- 9 Score 0 0       Fall Risk    08/07/2023    1:41 PM  Fall Risk   Falls in the past year? 0  Number falls in past yr: 0  Injury with Fall? 0  Risk for fall due to : No Fall Risks  Follow up Falls evaluation completed    Cognitive Function    08/07/2023    1:51 PM  MMSE - Mini Mental State Exam  Orientation to time 4  Orientation to Place 4  Registration 3  Attention/ Calculation 5  Recall  0  Language- name 2 objects 2  Language- repeat 0  Language- follow 3 step command 3  Language- read & follow direction 1  Write a sentence 1  Copy design 1  Total score 24        Patient Care Team: Vicci Barnie NOVAK, MD as PCP - General (Internal Medicine) Lonni Slain, MD as PCP - Cardiology (Cardiology) Cindie Carlin POUR, DO as Consulting Physician (Internal Medicine)     Plan:   1. Encounter for Medicare annual wellness exam (Primary)   2. Advance directive discussed with patient Patient given packet to take home to discuss with his adult children.  Advised that if she executes a living will or healthcare power of attorney, he needs to have it notarized and bring a copy for our record.  3. Poor vision - Ambulatory referral to Ophthalmology  4. Abnormal mini-mental status exam Language barrier likely playing a role.  We will plan to repeat again next year  5. Chronic left-sided low back pain without sciatica Patient requesting refill on tramadol  which he takes for his chronic lower back pain.  [place] reviewed. - traMADol  (ULTRAM ) 50 MG tablet; Take 1 tablet (50 mg total) by mouth daily as needed.  Dispense: 30 tablet; Refill: 2  6. Dermatitis of lower extremity - Ambulatory referral to Dermatology   I have personally reviewed and noted the following in the patient's chart:   Medical and social  history Use of alcohol, tobacco or illicit drugs  Current medications and supplements including opioid prescriptions. Patient is currently taking opioid prescriptions. Information provided to patient regarding non-opioid alternatives. Patient advised to discuss non-opioid treatment plan with their provider. Functional ability and status Nutritional status Physical activity Advanced directives List of other physicians Hospitalizations, surgeries, and ER visits in previous 12 months Vitals Screenings to include cognitive, depression, and falls Referrals and appointments  In addition, I have reviewed and discussed with patient certain preventive protocols, quality metrics, and best practice recommendations. A written personalized care plan for preventive services as well as general preventive health recommendations were provided to patient.     Barnie Vicci, MD 08/07/2023

## 2023-08-07 NOTE — Patient Instructions (Signed)
 If you execute a living will or healthcare power of attorney, please have it notarized and bring a copy for our records.

## 2023-08-16 ENCOUNTER — Ambulatory Visit (HOSPITAL_COMMUNITY)
Admission: EM | Admit: 2023-08-16 | Discharge: 2023-08-16 | Disposition: A | Discharge status: Home / Self Care | Attending: Physician Assistant | Admitting: Physician Assistant

## 2023-08-16 ENCOUNTER — Encounter (HOSPITAL_COMMUNITY): Payer: Self-pay

## 2023-08-16 DIAGNOSIS — M545 Low back pain, unspecified: Secondary | ICD-10-CM

## 2023-08-16 MED ORDER — METHYLPREDNISOLONE SODIUM SUCC 125 MG IJ SOLR
INTRAMUSCULAR | Status: AC
  Filled 2023-08-16: qty 2

## 2023-08-16 MED ORDER — KETOROLAC TROMETHAMINE 30 MG/ML IJ SOLN
INTRAMUSCULAR | Status: AC
  Filled 2023-08-16: qty 1

## 2023-08-16 MED ORDER — KETOROLAC TROMETHAMINE 60 MG/2ML IM SOLN
30.0000 mg | Freq: Once | INTRAMUSCULAR | Status: AC
  Administered 2023-08-16: 30 mg via INTRAMUSCULAR

## 2023-08-16 MED ORDER — METHYLPREDNISOLONE SODIUM SUCC 125 MG IJ SOLR
125.0000 mg | Freq: Once | INTRAMUSCULAR | Status: AC
  Administered 2023-08-16: 125 mg via INTRAMUSCULAR

## 2023-08-16 NOTE — ED Triage Notes (Signed)
 Patient here today with c/o low back pain X 5 days. Patient states that he has a h/o back pain but this is different. Patient takes Tramadol  and has had back injections in the past.

## 2023-08-16 NOTE — Discharge Instructions (Addendum)
 Follow up with your Physician for recheck.  Return if any problems.

## 2023-08-16 NOTE — ED Provider Notes (Signed)
 [facility]    CSN: [id number] Arrival date & time: 08/16/23  1046      History   Chief Complaint Chief Complaint  Patient presents with   Back Pain    HPI Nathaniel Robinson is a 68 y.o. male.   Pt complains of low back pain.  Pt reports he has a history of low back pain. Pt reports increased pain with movement   The history is provided by the patient. No language interpreter was used.  Back Pain Location:  Generalized Quality:  Aching Pain severity:  Moderate Timing:  Constant Progression:  Worsening Chronicity:  New Context: not twisting   Relieved by:  Nothing Worsened by:  Nothing Ineffective treatments:  None tried Associated symptoms: no leg pain     Past Medical History:  Diagnosis Date   Chronic low back pain 2010    GERD (gastroesophageal reflux disease)     Patient Active Problem List   Diagnosis Date Noted   Centrilobular emphysema (HCC) 05/27/2022   Aortic atherosclerosis (HCC) 05/20/2021   ED (erectile dysfunction) 01/27/2021   Controlled substance agreement signed 09/17/2020   Lung nodules 05/18/2020   Atherosclerosis of native coronary artery of native heart without angina pectoris 05/18/2020   Elevated PSA 12/26/2018   Flu vaccine need 10/08/2018   Spondylosis without myelopathy or radiculopathy, lumbar region 09/14/2018   Tobacco dependence 06/01/2018   Paresthesia 01/02/2018   Gastroesophageal reflux disease without esophagitis 09/15/2017   Idiopathic peripheral neuropathy 09/15/2017   Chronic lumbar radiculopathy 02/10/2017   Hyperlipidemia 03/14/2016   Lumbar degenerative disc disease 09/11/2015   Chronic pain syndrome 09/11/2015   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   Chronic low back pain     Past Surgical History:  Procedure Laterality Date   COLONOSCOPY N/A 05/15/2015   Procedure: COLONOSCOPY;  Surgeon: Margo LITTIE Haddock, MD;  [facility];  Service: Endoscopy;  Laterality: N/A;  145  - moved to 1:30 - office to notify   COLONOSCOPY WITH PROPOFOL  N/A 03/30/2021   Procedure: COLONOSCOPY WITH PROPOFOL ;  Surgeon: Cindie Carlin POUR, DO;  [facility];  Service: Endoscopy;  Laterality: N/A;  10:00 / ASA 2   POLYPECTOMY  03/30/2021   Procedure: POLYPECTOMY;  Surgeon: Cindie Carlin POUR, DO;  [facility];  Service: Endoscopy;;       Home Medications    Prior to Admission medications   Medication Sig Start Date End Date Taking? Authorizing Provider  albuterol  (VENTOLIN  HFA) 108 (90 Base) MCG/ACT inhaler Inhale 2 puffs into the lungs every 6 (six) hours as needed for wheezing or shortness of breath. 08/07/23   Vicci Barnie NOVAK, MD  atorvastatin  (LIPITOR) 20 MG tablet Take 1 tablet (20 mg total) by mouth daily. 08/07/23   Vicci Barnie NOVAK, MD  tamsulosin (FLOMAX) 0.4 MG CAPS capsule Take 0.4 mg by mouth daily.    [provider]  traMADol  (ULTRAM ) 50 MG tablet Take 1 tablet (50 mg total) by mouth daily as needed. 08/07/23   Vicci Barnie NOVAK, MD  umeclidinium bromide  (INCRUSE ELLIPTA ) 62.5 MCG/ACT AEPB Inhale 1 puff into the lungs daily. 05/20/23   Vicci Barnie NOVAK, MD    Family History Family History  Problem Relation Age of Onset   Ovarian cancer Mother    Other Father        unsure of medical history   Heart disease Neg Hx    Hypertension Neg Hx    Diabetes Neg  Hx    Colon cancer Neg Hx     Social History Social History   Tobacco Use   Smoking status: Former    Current packs/day: 0.00    Types: Cigarettes    Quit date: 05/15/2020    Years since quitting: 3.2   Smokeless tobacco: Never  Vaping Use   Vaping status: Never Used  Substance Use Topics   Alcohol use: Yes    Comment: social, rare   Drug use: No     Allergies   Patient has no known allergies.   Review of Systems Review of Systems  Musculoskeletal:  Positive for back pain.  All other systems reviewed and are negative.    Physical Exam Triage Vital  Signs ED Triage Vitals [08/16/23 1140]  Encounter Vitals Group     BP 122/71     Girls Systolic BP Percentile      Girls Diastolic BP Percentile      Boys Systolic BP Percentile      Boys Diastolic BP Percentile      Pulse Rate 73     Resp 16     Temp 98 F (36.7 C)     Temp Source Oral     SpO2 94 %     Weight      Height      Head Circumference      Peak Flow      Pain Score 7     Pain Loc      Pain Education      Exclude from Growth Chart    No data found.  Updated Vital Signs BP 122/71 (BP Location: Right Arm)   Pulse 73   Temp 98 F (36.7 C) (Oral)   Resp 16   SpO2 94%   Visual Acuity Right Eye Distance:   Left Eye Distance:   Bilateral Distance:    Right Eye Near:   Left Eye Near:    Bilateral Near:     Physical Exam Vitals and nursing note reviewed.  Constitutional:      Appearance: He is well-developed.  HENT:     Head: Normocephalic.  Cardiovascular:     Rate and Rhythm: Normal rate.  Pulmonary:     Effort: Pulmonary effort is normal.  Abdominal:     General: There is no distension.  Musculoskeletal:        General: Normal range of motion.     Cervical back: Normal range of motion.  Skin:    General: Skin is warm.  Neurological:     General: No focal deficit present.     Mental Status: He is alert and oriented to person, place, and time.      UC Treatments / Results  Labs (all labs ordered are listed, but only abnormal results are displayed) Labs Reviewed - No data to display  EKG   Radiology No results found.  Procedures Procedures (including critical care time)  Medications Ordered in UC Medications  methylPREDNISolone  sodium succinate (SOLU-MEDROL ) 125 mg/2 mL injection 125 mg (125 mg Intramuscular Given 08/16/23 1242)  ketorolac  (TORADOL ) injection 30 mg (30 mg Intramuscular Given 08/16/23 1242)    Initial Impression / Assessment and Plan / UC Course  I have reviewed the triage vital signs and the nursing  notes.  Pertinent labs & imaging results that were available during my care of the patient were reviewed by me and considered in my medical decision making (see chart for details).    Patient is given  an injection of Toradol  and Solu-Medrol .  Patient advised to schedule appointment to see his physician for recheck.  Final Clinical Impressions(s) / UC Diagnoses   Final diagnoses:  Acute low back pain without sciatica, unspecified back pain laterality     Discharge Instructions      Follow up with your Physician for recheck.  Return if any problems.    ED Prescriptions   None    PDMP not reviewed this encounter. An After Visit Summary was printed and given to the patient.       Flint Sonny POUR, [place] 08/16/23 1427

## 2023-08-25 ENCOUNTER — Encounter: Payer: Self-pay | Admitting: Physical Medicine & Rehabilitation

## 2023-08-25 ENCOUNTER — Encounter: Attending: Physical Medicine & Rehabilitation | Admitting: Physical Medicine & Rehabilitation

## 2023-08-25 VITALS — BP 117/74 | HR 67 | Ht 65.0 in | Wt 154.0 lb

## 2023-08-25 DIAGNOSIS — M47816 Spondylosis without myelopathy or radiculopathy, lumbar region: Secondary | ICD-10-CM | POA: Diagnosis not present

## 2023-08-25 NOTE — Patient Instructions (Signed)
 Please call.

## 2023-08-25 NOTE — Progress Notes (Signed)
 Subjective:  The visit was performed with a Tigrinian interpreter I agree what are the somatic languages  Patient ID: Nathaniel Robinson, male    DOB: 1955-08-06, 68 y.o.   MRN: [id number]  HPI 68 year old male with history of lumbar spondylosis who has had good results with lumbar radiofrequency neurotomy targeting the L4-5 L5-S1 facet levels on the left side.  He underwent left L3-L4-L5 RF on 01/26/2023.  Approximately 2 weeks ago he developed increasing pain left side low back.  He had pain with bending forward or backward.  Denies any new injuries.  He has occasional pain going down the right thigh this occurs almost daily but lasts only a very short period of time and is not a major issue for him.  He did go to urgent care on 08/17/2022 because he had 4 days of fairly severe low back pain.  He has been seeing his primary care physician who is prescribed tramadol  50 mg once a day which she continues to take. Pain Inventory Average Pain 5 Pain Right Now 5 My pain is burning  In the last 24 hours, has pain interfered with the following? General activity 3 Relation with others 3 Enjoyment of life 3 What TIME of day is your pain at its worst? daytime Sleep (in general) Fair  Pain is worse with: walking and bending Pain improves with: rest and medication Relief from Meds: 7  Family History  Problem Relation Age of Onset   Ovarian cancer Mother    Other Father        unsure of medical history   Heart disease Neg Hx    Hypertension Neg Hx    Diabetes Neg Hx    Colon cancer Neg Hx    Social History   Socioeconomic History   Marital status: Married    Spouse name: Not on file   Number of children: 4   Years of education: come college   Highest education level: Not on file  Occupational History   Occupation: disability    Comment: [place]   Tobacco Use   Smoking status: Former    Current packs/day: 0.00    Types: Cigarettes    Quit date: 05/15/2020    Years since quitting: 3.2    Smokeless tobacco: Never  Vaping Use   Vaping status: Never Used  Substance and Sexual Activity   Alcohol use: Yes    Comment: social, rare   Drug use: No   Sexual activity: Never  Other Topics Concern   Not on file  Social History Narrative   Form [place]   Moved to [place]  in 01/2014    Lives with roommate.   Right-handed.   No caffeine use.   Wife and 4 children in [place].    Social Drivers of Corporate investment banker Strain: Low Risk  (08/07/2023)   Overall Financial Resource Strain (CARDIA)    Difficulty of Paying Living Expenses: Not very hard  Food Insecurity: No Food Insecurity (08/07/2023)   Hunger Vital Sign    Worried About Running Out of Food in the Last Year: Never true    Ran Out of Food in the Last Year: Never true  Transportation Needs: No Transportation Needs (08/07/2023)   PRAPARE - Administrator, Civil Service (Medical): No    Lack of Transportation (Non-Medical): No  Physical Activity: Inactive (08/07/2023)   Exercise Vital Sign    Days of Exercise per Week: 0 days    Minutes of Exercise per  Session: 0 min  Stress: No Stress Concern Present (08/07/2023)   [employer] of Occupational Health - Occupational Stress Questionnaire    Feeling of Stress: Not at all  Social Connections: Moderately Integrated (08/07/2023)   Social Connection and Isolation Panel    Frequency of Communication with Friends and Family: More than three times a week    Frequency of Social Gatherings with Friends and Family: More than three times a week    Attends Religious Services: More than 4 times per year    Active Member of [employer] or Organizations: No    Attends Banker Meetings: Never    Marital Status: Married   Past Surgical History:  Procedure Laterality Date   COLONOSCOPY N/A 05/15/2015   Procedure: COLONOSCOPY;  Surgeon: Margo LITTIE Haddock, MD;  [facility];  Service: Endoscopy;  Laterality: N/A;  145 - moved to 1:30 - office to notify    COLONOSCOPY WITH PROPOFOL  N/A 03/30/2021   Procedure: COLONOSCOPY WITH PROPOFOL ;  Surgeon: Cindie Carlin POUR, DO;  [facility]: [facility];  Service: Endoscopy;  Laterality: N/A;  10:00 / ASA 2   POLYPECTOMY  03/30/2021   Procedure: POLYPECTOMY;  Surgeon: Cindie Carlin POUR, DO;  [facility]: [facility];  Service: Endoscopy;;   Past Surgical History:  Procedure Laterality Date   COLONOSCOPY N/A 05/15/2015   Procedure: COLONOSCOPY;  Surgeon: Margo LITTIE Haddock, MD;  [facility];  Service: Endoscopy;  Laterality: N/A;  145 - moved to 1:30 - office to notify   COLONOSCOPY WITH PROPOFOL  N/A 03/30/2021   Procedure: COLONOSCOPY WITH PROPOFOL ;  Surgeon: Cindie Carlin POUR, DO;  [facility];  Service: Endoscopy;  Laterality: N/A;  10:00 / ASA 2   POLYPECTOMY  03/30/2021   Procedure: POLYPECTOMY;  Surgeon: Cindie Carlin POUR, DO;  [facility];  Service: Endoscopy;;   Past Medical History:  Diagnosis Date   Chronic low back pain 2010    GERD (gastroesophageal reflux disease)    BP 117/74 (BP Location: Left Arm, Patient Position: Sitting, Cuff Size: Normal)   Pulse 67   Ht 5' 5 (1.651 m)   Wt 154 lb (69.9 kg)   SpO2 95%   BMI 25.63 kg/m   Opioid Risk Score:   Fall Risk Score:  `1  Depression screen [facility] 2/9     08/25/2023   12:46 PM 08/07/2023    1:41 PM 06/06/2023    9:51 AM 02/03/2023   10:43 AM 01/26/2023   11:05 AM 11/03/2022   12:50 PM 09/27/2022   10:26 AM  Depression screen PHQ 2/9  Decreased Interest 0 0 0 0 0 0 0  Down, Depressed, Hopeless 0 0 0 0 0 0 0  PHQ - 2 Score 0 0 0 0 0 0 0  Altered sleeping 0 0 0    0  Tired, decreased energy 0 0 0    0  Change in appetite 0 0 0    0  Feeling bad or failure about yourself  0 0 0    0  Trouble concentrating 0 0 0    0  Moving slowly or fidgety/restless 0 0 0    0  Suicidal thoughts 0 0 0    0  PHQ-9 Score 0 0 0    0  Difficult doing work/chores Somewhat difficult Not difficult at all Not difficult at  all          Review of Systems  Musculoskeletal:  Positive  for back pain.       Left sided back pain  All other systems reviewed and are negative.      Objective:   Physical Exam  General No acute distress Mood affect appropriate Extremities without edema Negative straight leg raising bilaterally Ambulates without assistive device no evidence of toe drag and instability Lumbar spine range of motion 75 % flexion and extension 75% lateral bending There is tenderness to palpation around L5-S1 paraspinal area on the left side only.       Assessment & Plan:  #1.  Lumbar spondylosis without myelopathy.  He has some occasional right thigh pain of unclear etiology but not a major concern at this point. He has responded well to L3-L4-L5 lumbar radiofrequency neurotomy I suspect that the 1 performed in December 2024 started to wear off and he started become more symptomatic.  He will continue on tramadol  50 mg/day Will do a round of physical therapy and see him back in 1 month if no better we may consider repeating the radiofrequency neurotomy.  He is concerned that the procedure was painful even with the Valium .  We may consider referral to a clinic that provides conscious sedation with IV sedative

## 2023-09-06 ENCOUNTER — Ambulatory Visit: Admitting: Physical Therapy

## 2023-09-22 ENCOUNTER — Encounter: Admitting: Physical Medicine & Rehabilitation

## 2023-10-06 ENCOUNTER — Ambulatory Visit: Admitting: Internal Medicine

## 2023-10-06 ENCOUNTER — Other Ambulatory Visit: Payer: Self-pay | Admitting: Internal Medicine

## 2023-11-07 ENCOUNTER — Encounter: Attending: Physical Medicine & Rehabilitation | Admitting: Physical Medicine & Rehabilitation

## 2023-11-07 ENCOUNTER — Encounter: Payer: Self-pay | Admitting: Physical Medicine & Rehabilitation

## 2023-11-07 VITALS — BP 101/68 | HR 85 | Ht 65.0 in | Wt 155.0 lb

## 2023-11-07 DIAGNOSIS — M47816 Spondylosis without myelopathy or radiculopathy, lumbar region: Secondary | ICD-10-CM | POA: Diagnosis present

## 2023-11-07 MED ORDER — DIAZEPAM 10 MG PO TABS: 10.0000 mg | ORAL_TABLET | Freq: Four times a day (QID) | ORAL | 0 refills | Status: AC | PRN

## 2023-11-07 NOTE — Progress Notes (Signed)
 Subjective:    Patient ID: Nathaniel Robinson, male    DOB: 1955-08-29, 68 y.o.   MRN: [id number] Live interpreter Tigrinian HPI 68 year old male with chronic low back pain and history of lumbar spondylosis who returns today with Gradual worsening of low back pain.  The patient went to therapy once but was late and had to reschedule but never did reschedule his appointment. His at rest pain is about the same but his pain with activity is increasing from 3/10 to 5/10 between July and today. There is no numbness tingling in the lower extremities no progressive weakness.  No loss of bowel bladder function Pain Inventory Average Pain 5 Pain Right Now 4 My pain is burning  In the last 24 hours, has pain interfered with the following? General activity 5 Relation with others 5 Enjoyment of life 5 What TIME of day is your pain at its worst? daytime Sleep (in general) Good  Pain is worse with: walking and bending Pain improves with: rest and medication Relief from Meds: 6  Family History  Problem Relation Age of Onset   Ovarian cancer Mother    Other Father        unsure of medical history   Heart disease Neg Hx    Hypertension Neg Hx    Diabetes Neg Hx    Colon cancer Neg Hx    Social History   Socioeconomic History   Marital status: Married    Spouse name: Not on file   Number of children: 4   Years of education: come college   Highest education level: Not on file  Occupational History   Occupation: disability    Comment: [place]   Tobacco Use   Smoking status: Former    Current packs/day: 0.00    Types: Cigarettes    Quit date: 05/15/2020    Years since quitting: 3.4   Smokeless tobacco: Never  Vaping Use   Vaping status: Never Used  Substance and Sexual Activity   Alcohol use: Yes    Comment: social, rare   Drug use: No   Sexual activity: Never  Other Topics Concern   Not on file  Social History Narrative   Form [place]   [place]oved [place]  in [place]2014    Lives with  roommate.   Right-handed.   No caffeine use.   Wife and 4 children in [place].    Social Drivers of Corporate investment banker Strain: Low Risk  (08/07/2023)   Overall Financial Resource Strain (CARDIA)    Difficulty of Paying Living Expenses: Not very hard  Food Insecurity: No Food Insecurity (08/07/2023)   Hunger Vital Sign    Worried About Running Out of Food in the Last Year: Never true    Ran Out of Food in the Last Year: Never true  Transportation Needs: No Transportation Needs (08/07/2023)   PRAPARE - Administrator, Civil Service (Medical): No    Lack of Transportation (Non-Medical): No  Physical Activity: Inactive (08/07/2023)   Exercise Vital Sign    Days of Exercise per Week: 0 days    Minutes of Exercise per Session: 0 min  Stress: No Stress Concern Present (08/07/2023)   [employer] of Occupational Health - Occupational Stress Questionnaire    Feeling of Stress: Not at all  Social Connections: Moderately Integrated (08/07/2023)   Social Connection and Isolation Panel    Frequency of Communication with Friends and Family: More than three times a week    Frequency  of Social Gatherings with Friends and Family: More than three times a week    Attends Religious Services: More than 4 times per year    Active Member of Clubs or Organizations: No    Attends Banker Meetings: Never    Marital Status: Married   Past Surgical History:  Procedure Laterality Date   COLONOSCOPY N/A 05/15/2015   Procedure: COLONOSCOPY;  Surgeon: Margo LITTIE Haddock, MD;  [facility];  Service: Endoscopy;  Laterality: N/A;  145 - moved to 1:30 - office to notify   COLONOSCOPY WITH PROPOFOL  N/A 03/30/2021   Procedure: COLONOSCOPY WITH PROPOFOL ;  Surgeon: Cindie Carlin POUR, DO;  [facility];  Service: Endoscopy;  Laterality: N/A;  10:00 / ASA 2   POLYPECTOMY  03/30/2021   Procedure: POLYPECTOMY;  Surgeon: Cindie Carlin POUR, DO;  [facility];   Service: Endoscopy;;   Past Surgical History:  Procedure Laterality Date   COLONOSCOPY N/A 05/15/2015   Procedure: COLONOSCOPY;  Surgeon: Margo LITTIE Haddock, MD;  [facility];  Service: Endoscopy;  Laterality: N/A;  145 - moved to 1:30 - office to notify   COLONOSCOPY WITH PROPOFOL  N/A 03/30/2021   Procedure: COLONOSCOPY WITH PROPOFOL ;  Surgeon: Cindie Carlin POUR, DO;  [facility];  Service: Endoscopy;  Laterality: N/A;  10:00 / ASA 2   POLYPECTOMY  03/30/2021   Procedure: POLYPECTOMY;  Surgeon: Cindie Carlin POUR, DO;  [facility];  Service: Endoscopy;;   Past Medical History:  Diagnosis Date   Chronic low back pain 2010    GERD (gastroesophageal reflux disease)    BP 101/68   Pulse 85   Ht 5' 5 (1.651 m)   Wt 155 lb (70.3 kg)   SpO2 95%   BMI 25.79 kg/m   Opioid Risk Score:   Fall Risk Score:  `1  Depression screen [facility] 2/9     11/07/2023   11:27 AM 08/25/2023   12:46 PM 08/07/2023    1:41 PM 06/06/2023    9:51 AM 02/03/2023   10:43 AM 01/26/2023   11:05 AM 11/03/2022   12:50 PM  Depression screen PHQ 2/9  Decreased Interest 0 0 0 0 0 0 0  Down, Depressed, Hopeless 0 0 0 0 0 0 0  PHQ - 2 Score 0 0 0 0 0 0 0  Altered sleeping  0 0 0     Tired, decreased energy  0 0 0     Change in appetite  0 0 0     Feeling bad or failure about yourself   0 0 0     Trouble concentrating  0 0 0     Moving slowly or fidgety/restless  0 0 0     Suicidal thoughts  0 0 0     PHQ-9 Score  0 0 0     Difficult doing work/chores  Somewhat difficult Not difficult at all Not difficult at all       Review of Systems  Musculoskeletal:  Positive for back pain.  All other systems reviewed and are negative.      Objective:   Physical Exam General No acute distress Mood and affect appropriate Motor strength is 5/5 bilateral hip flexor knee extensor ankle dorsiflexor Negative straight leg raising He has lumbar pain with right sided bending on the left side of  his back There is pain with endrange lumbar flexion as well as lumbar extension.  His lumbar flexion  and extension are 75% of normal Ambulates without assistive device no evidence toe drag or knee stability       Assessment & Plan:   1.  Lumbar spondylosis without myelopathy.  This pain is only partially managed by narcotic analgesics.  He is on low-dose tramadol  which is prescribed by his primary care physician.  He is approximately 9 months post left L3-L4 medial branch and L5 dorsal ramus radiofrequency neurotomy under fluoroscopic guidance.  His pain with activity scores are going up. Will schedule him for repeat left L3-L4 medial branch and left L5 dorsal ramus radiofrequency neurotomy under fluoroscopic guidance in 2 months. He had greater than 50% relief of his pain for greater than 6 months.  Goal of treatment is to maintain activity level while minimizing opioid usage.

## 2023-12-07 ENCOUNTER — Encounter: Payer: Self-pay | Admitting: Internal Medicine

## 2023-12-07 ENCOUNTER — Ambulatory Visit: Attending: Internal Medicine | Admitting: Internal Medicine

## 2023-12-07 VITALS — BP 122/74 | HR 77 | Temp 97.5°F | Ht 65.0 in | Wt 153.0 lb

## 2023-12-07 DIAGNOSIS — G8929 Other chronic pain: Secondary | ICD-10-CM | POA: Insufficient documentation

## 2023-12-07 DIAGNOSIS — R7303 Prediabetes: Secondary | ICD-10-CM | POA: Diagnosis not present

## 2023-12-07 DIAGNOSIS — L309 Dermatitis, unspecified: Secondary | ICD-10-CM | POA: Insufficient documentation

## 2023-12-07 DIAGNOSIS — J432 Centrilobular emphysema: Secondary | ICD-10-CM | POA: Insufficient documentation

## 2023-12-07 DIAGNOSIS — L299 Pruritus, unspecified: Secondary | ICD-10-CM | POA: Diagnosis not present

## 2023-12-07 DIAGNOSIS — Z760 Encounter for issue of repeat prescription: Secondary | ICD-10-CM | POA: Diagnosis present

## 2023-12-07 DIAGNOSIS — G629 Polyneuropathy, unspecified: Secondary | ICD-10-CM | POA: Insufficient documentation

## 2023-12-07 DIAGNOSIS — M545 Low back pain, unspecified: Secondary | ICD-10-CM | POA: Diagnosis not present

## 2023-12-07 DIAGNOSIS — Z87891 Personal history of nicotine dependence: Secondary | ICD-10-CM | POA: Diagnosis not present

## 2023-12-07 DIAGNOSIS — E785 Hyperlipidemia, unspecified: Secondary | ICD-10-CM | POA: Insufficient documentation

## 2023-12-07 DIAGNOSIS — Z79899 Other long term (current) drug therapy: Secondary | ICD-10-CM | POA: Diagnosis not present

## 2023-12-07 DIAGNOSIS — R911 Solitary pulmonary nodule: Secondary | ICD-10-CM | POA: Diagnosis not present

## 2023-12-07 MED ORDER — ATORVASTATIN CALCIUM 20 MG PO TABS: 20.0000 mg | ORAL_TABLET | Freq: Every day | ORAL | 1 refills | Status: AC

## 2023-12-07 MED ORDER — TRAMADOL HCL 50 MG PO TABS: 50.0000 mg | ORAL_TABLET | Freq: Every day | ORAL | 2 refills | Status: DC | PRN

## 2023-12-07 NOTE — Progress Notes (Signed)
 Patient ID: Nathaniel Robinson, male    DOB: 05/06/1955  MRN: [id number]  CC: Chronic disease management (Chronic disease management. Med refills. /Itching of L leg X1 yr - requesting referral to [facility] received flu vax)   Subjective: Nathaniel Robinson is a 68 y.o. male who presents for chronic ds management. His concerns today include:  Pt with hx of HL, PreDM, chronic LBP, GERD, peripheral neuropathy, former smoker, centrilobular emphysema, lung nodules (due for repeat 03/2021), aortic and coronary Ca+, elev PSA (neg bx 02/2019), PreDM   AMN Language interpreter used during this encounter. #[id number]   Discussed the use of AI scribe software for clinical note transcription with the patient, who gave verbal consent to proceed.  History of Present Illness He is a 68 year old male with hyperlipidemia and COPD who presents for follow-up of his chronic medical conditions.  He is currently taking atorvastatin  20 mg daily for hyperlipidemia, and his cholesterol levels have been stable. He uses inhalers for COPD on an as-needed basis and has not experienced any increased shortness of breath or cough recently.  He experiences chronic lower back pain and sees Dr. Carilyn intermittently whom he last saw 11/07/23. . . He is scheduled for another injection next month.  Patient reported increased pain scale despite the tramadol  so he scheduled him for repeat left L3-L4 medial branch and left L5 dorsal ramus radiofrequency neurotomy. He continues to take tramadol  for pain management and tolerates it well without significant drowsiness or other S.E.  On last visit he was referred to derm for chronic rash on lateral LT lower leg. Reported rash on LT lower leg no better with steroid cream. Derm group he was referred to told him he can not be seen. Requests referral to [facility].   Patient Active Problem List   Diagnosis Date Noted   Centrilobular emphysema (HCC) 05/27/2022   Aortic  atherosclerosis 05/20/2021   ED (erectile dysfunction) 01/27/2021   Controlled substance agreement signed 09/17/2020   Lung nodules 05/18/2020   Atherosclerosis of native coronary artery of native heart without angina pectoris 05/18/2020   Elevated PSA 12/26/2018   Flu vaccine need 10/08/2018   Spondylosis without myelopathy or radiculopathy, lumbar region 09/14/2018   Tobacco dependence 06/01/2018   Paresthesia 01/02/2018   Gastroesophageal reflux disease without esophagitis 09/15/2017   Idiopathic peripheral neuropathy 09/15/2017   Chronic lumbar radiculopathy 02/10/2017   Hyperlipidemia 03/14/2016   Lumbar degenerative disc disease 09/11/2015   Chronic pain syndrome 09/11/2015   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 18 g 6   diazepam  (VALIUM ) 10 MG tablet Take 1 tablet (10 mg total) by mouth every 6 (six) hours as needed for anxiety. 1 tablet 0   tamsulosin (FLOMAX) 0.4 MG CAPS capsule Take 0.4 mg by mouth daily. (Patient not taking: Reported on 12/07/2023)     umeclidinium bromide  (INCRUSE ELLIPTA ) 62.5 MCG/ACT AEPB Inhale 1 puff into the lungs daily. (Patient not taking: Reported on 12/07/2023) 30 each 6   No current facility-administered medications on file prior to visit.    No Known Allergies  Social History   Socioeconomic History   Marital status: Married  Spouse name: Not on file   Number of children: 4   Years of education: come college   Highest education level: Not on file  Occupational History   Occupation: disability    Comment: [place]   Tobacco Use   Smoking status: Former    Current packs/day: 0.00    Types: Cigarettes    Quit date: 05/15/2020    Years  since quitting: 3.5   Smokeless tobacco: Never  Vaping Use   Vaping status: Never Used  Substance and Sexual Activity   Alcohol use: Yes    Comment: social, rare   Drug use: No   Sexual activity: Never  Other Topics Concern   Not on file  Social History Narrative   Form [place]   Moved to [place]  in 01/2014    Lives with roommate.   Right-handed.   No caffeine use.   Wife and 4 children in [place].    Social Drivers of Corporate investment banker Strain: Low Risk  (08/07/2023)   Overall Financial Resource Strain (CARDIA)    Difficulty of Paying Living Expenses: Not very hard  Food Insecurity: No Food Insecurity (08/07/2023)   Hunger Vital Sign    Worried About Running Out of Food in the Last Year: Never true    Ran Out of Food in the Last Year: Never true  Transportation Needs: No Transportation Needs (08/07/2023)   PRAPARE - Administrator, Civil Service (Medical): No    Lack of Transportation (Non-Medical): No  Physical Activity: Inactive (08/07/2023)   Exercise Vital Sign    Days of Exercise per Week: 0 days    Minutes of Exercise per Session: 0 min  Stress: No Stress Concern Present (08/07/2023)   [employer] of Occupational Health - Occupational Stress Questionnaire    Feeling of Stress: Not at all  Social Connections: Moderately Integrated (08/07/2023)   Social Connection and Isolation Panel    Frequency of Communication with Friends and Family: More than three times a week    Frequency of Social Gatherings with Friends and Family: More than three times a week    Attends Religious Services: More than 4 times per year    Active Member of [employer] or Organizations: No    Attends Banker Meetings: Never    Marital Status: Married  Catering manager Violence: Not At Risk (08/07/2023)   Humiliation, Afraid, Rape, and Kick questionnaire    Fear of Current or Ex-Partner: No    Emotionally Abused: No    Physically Abused: No    Sexually Abused: No     Family History  Problem Relation Age of Onset   Ovarian cancer Mother    Other Father        unsure of medical history   Heart disease Neg Hx    Hypertension Neg Hx    Diabetes Neg Hx    Colon cancer Neg Hx     Past Surgical History:  Procedure Laterality Date   COLONOSCOPY N/A 05/15/2015   Procedure: COLONOSCOPY;  Surgeon: Margo LITTIE Haddock, MD;  [facility];  Service: Endoscopy;  Laterality: N/A;  145 - moved to 1:30 - office to notify   COLONOSCOPY WITH PROPOFOL  N/A 03/30/2021   Procedure: COLONOSCOPY WITH PROPOFOL ;  Surgeon: Cindie Carlin POUR, DO;  Location: [facility];  Service: Endoscopy;  Laterality: N/A;  10:00 / ASA 2   POLYPECTOMY  03/30/2021   Procedure: POLYPECTOMY;  Surgeon: Cindie Carlin POUR, DO;  Location:  [facility];  Service: Endoscopy;;    ROS: Review of Systems Negative except as stated above  PHYSICAL EXAM: BP 122/74 (BP Location: Left Arm, Patient Position: Sitting, Cuff Size: Normal)   Pulse 77   Temp (!) 97.5 F (36.4 C) (Oral)   Ht 5' 5 (1.651 m)   Wt 153 lb (69.4 kg)   SpO2 97%   BMI 25.46 kg/m   Physical Exam   General appearance - alert, well appearing, and in no distress Mental status - normal mood, behavior, speech, dress, motor activity, and thought processes Neck - supple, no significant adenopathy Chest - clear to auscultation, no wheezes, rales or rhonchi, symmetric air entry Heart - normal rate, regular rhythm, normal S1, S2, no murmurs, rubs, clicks or gallops Skin - Area on left lower leg laterally mid calf: Macular slightly hyperpigmented area      Latest Ref Rng & Units 08/04/2023   11:34 AM 05/27/2022   11:51 AM 05/20/2021   12:16 PM  CMP  Glucose 70 - 99 mg/dL 879  96  880   BUN 8 - 23 mg/dL 10  11  11    Creatinine 0.61 - 1.24 mg/dL 9.24  9.22  9.25   Sodium 135 - 145 mmol/L 138  140  144   Potassium 3.5 - 5.1 mmol/L 3.8  4.8  4.6   Chloride 98 - 111 mmol/L 106  105  105   CO2 22 - 32 mmol/L 24  24  23     Calcium  8.9 - 10.3 mg/dL 9.1  9.3  9.3   Total Protein 6.5 - 8.1 g/dL 6.9  6.6  6.9   Total Bilirubin 0.0 - 1.2 mg/dL 0.6  0.4  0.4   Alkaline Phos 38 - 126 U/L 45  64  68   AST 15 - 41 U/L 21  19  20    ALT 0 - 44 U/L 20  16  17     Lipid Panel     Component Value Date/Time   CHOL 129 05/27/2022 1151   TRIG 101 05/27/2022 1151   HDL 45 05/27/2022 1151   CHOLHDL 2.9 05/27/2022 1151   CHOLHDL 3.5 03/10/2016 1412   VLDL 25 03/10/2016 1412   LDLCALC 65 05/27/2022 1151    CBC    Component Value Date/Time   WBC 5.4 08/04/2023 1134   RBC 5.13 08/04/2023 1134   HGB 15.8 08/04/2023 1134   HGB 16.2 05/27/2022 1151   HCT 46.9 08/04/2023 1134   HCT 47.5 05/27/2022 1151   PLT 210 08/04/2023 1134   PLT 239 05/27/2022 1151   MCV 91.4 08/04/2023 1134   MCV 93 05/27/2022 1151   MCH 30.8 08/04/2023 1134   MCHC 33.7 08/04/2023 1134   RDW 13.1 08/04/2023 1134   RDW 12.7 05/27/2022 1151   LYMPHSABS 2.9 08/04/2023 1134   MONOABS 0.5 08/04/2023 1134   EOSABS 0.2 08/04/2023 1134   BASOSABS 0.0 08/04/2023 1134    ASSESSMENT AND PLAN: 1. Hyperlipidemia, unspecified hyperlipidemia type (Primary) Continue atorvastatin . - atorvastatin  (LIPITOR) 20 MG tablet; Take 1 tablet (20 mg total) by mouth daily.  Dispense: 90 tablet; Refill: 1  2. Centrilobular emphysema (HCC) Stable.  Continue Incruse and albuterol  inhalers as needed.  3. Dermatitis of lower extremity Will resubmit referral to: Dermatology - Ambulatory referral to Dermatology  4. Chronic left-sided low back pain without sciatica Patient to keep appointment as scheduled by PMR for radiofrequency neurotomy. We got him up to date with controlled substance prescribing agreement.  I went over the agreement with him and he was agreeable to the terms.  We had him sign it.  We will continue tramadol  50 mg once a day as needed.  He denies any significant side effects and benefits from the medication. NNCSRS reviewed - traMADol  (ULTRAM ) 50  MG tablet; Take 1 tablet (50 mg total) by mouth daily as needed.  Dispense: 30 tablet; Refill: 2  Patient was given the opportunity to ask questions.  Patient verbalized understanding of the plan and was able to repeat key elements of the plan.   This documentation was completed using Paediatric nurse.  Any transcriptional errors are unintentional.  Orders Placed This Encounter  Procedures   Ambulatory referral to Dermatology     Requested Prescriptions   Signed Prescriptions Disp Refills   atorvastatin  (LIPITOR) 20 MG tablet 90 tablet 1    Sig: Take 1 tablet (20 mg total) by mouth daily.   traMADol  (ULTRAM ) 50 MG tablet 30 tablet 2    Sig: Take 1 tablet (50 mg total) by mouth daily as needed.    Return in about 5 months (around 05/06/2024).  Barnie Louder, MD, FACP

## 2023-12-07 NOTE — Patient Instructions (Signed)
  VISIT SUMMARY: You are a 68 year old male with a history of high cholesterol and chronic obstructive pulmonary disease (COPD) who came in for a follow-up visit. Your cholesterol levels are stable with your current medication, and your COPD is well-managed with inhalers. You also have chronic lower back pain and itching on your left leg.  YOUR PLAN: -CHRONIC LOW BACK PAIN: Chronic low back pain is ongoing pain in the lower back area. You are managing this with tramadol  and periodic injections. You will continue taking tramadol , and you have a scheduled injection next month. Please review and sign the controlled substance prescribing agreement.  -CHRONIC OBSTRUCTIVE PULMONARY DISEASE (COPD): COPD is a chronic lung condition that makes it hard to breathe. Your condition is well-managed with as-needed inhalers, and no changes are needed at this time.  -HYPERLIPIDEMIA: Hyperlipidemia is having high levels of fats (like cholesterol) in your blood. Your cholesterol levels are within the target range with atorvastatin  20 mg daily. Please continue taking this medication as prescribed.  -PRURITUS OF LEFT LEG: Pruritus means itching. The itching on your left leg has not improved with steroid cream. A referral to a dermatologist will be submitted for further evaluation.  INSTRUCTIONS: Please continue taking your medications as prescribed. Review and sign the controlled substance prescribing agreement for tramadol . You have a scheduled injection for your lower back pain next month. A referral to a dermatologist will be submitted for the itching on your left leg.                      Contains text generated by Abridge.                                 Contains text generated by Abridge.

## 2023-12-10 ENCOUNTER — Other Ambulatory Visit: Payer: Self-pay | Admitting: Internal Medicine

## 2023-12-10 DIAGNOSIS — M545 Low back pain, unspecified: Secondary | ICD-10-CM

## 2023-12-12 NOTE — Telephone Encounter (Signed)
 Requested medications are due for refill today.  no  Requested medications are on the active medications list.  yes  Last refill. 12/07/2023 #30 2 rf  Future visit scheduled.   yes  Notes to clinic.  Refusal not delegated.    Requested Prescriptions  Pending Prescriptions Disp Refills   traMADol  (ULTRAM ) 50 MG tablet [Pharmacy Med Name: TRAMADOL  50MG  TABLETS] 30 tablet     Sig: TAKE 1 TABLET(50 MG) BY MOUTH DAILY AS NEEDED     Not Delegated - Analgesics:  Opioid Agonists Failed - 12/12/2023 12:38 PM      Failed - This refill cannot be delegated      Failed - Urine Drug Screen completed in last 360 days      Passed - Valid encounter within last 3 months    Recent Outpatient Visits           5 days ago Hyperlipidemia, unspecified hyperlipidemia type   [facility] [place]. [facility] Barnie NOVAK, MD   4 months ago Encounter for [employer] annual wellness exam   [facility] [place] [facility] Dept Of [place]. [facility] Barnie NOVAK, MD   6 months ago Hyperlipidemia, unspecified hyperlipidemia type   [facility] [facility] - [facility] Dept Of [place]. [facility] Barnie NOVAK, MD   10 months ago Centrilobular emphysema [facility])   [place] [facility] [place] - [facility] Dept Of [place]. [facility] Barnie NOVAK, MD   1 year ago Hyperlipidemia, unspecified hyperlipidemia type   [facility] [facility] - A Dept Of [place]. [facility] Barnie NOVAK, MD       Future Appointments             In 7 months Sandridge, Brenda K, PA-C [facility]

## 2024-01-02 ENCOUNTER — Ambulatory Visit: Admitting: Physical Medicine & Rehabilitation

## 2024-01-05 ENCOUNTER — Encounter: Attending: Physical Medicine & Rehabilitation | Admitting: Physical Medicine & Rehabilitation

## 2024-01-05 ENCOUNTER — Encounter: Payer: Self-pay | Admitting: Physical Medicine & Rehabilitation

## 2024-01-05 VITALS — BP 113/70 | HR 90 | Ht 65.0 in | Wt 152.4 lb

## 2024-01-05 DIAGNOSIS — M47816 Spondylosis without myelopathy or radiculopathy, lumbar region: Secondary | ICD-10-CM

## 2024-01-05 MED ORDER — LIDOCAINE HCL 1 % IJ SOLN
10.0000 mL | Freq: Once | INTRAMUSCULAR | Status: AC
  Administered 2024-01-05: 10 mL

## 2024-01-05 MED ORDER — LIDOCAINE HCL (PF) 2 % IJ SOLN
3.0000 mL | Freq: Once | INTRAMUSCULAR | Status: AC
  Administered 2024-01-05: 3 mL

## 2024-01-05 NOTE — Progress Notes (Signed)
Left L5 dorsal ramus., left L4 and left L3 medial branch radio frequency neurotomy under fluoroscopic guidance  Indication: Low back pain due to lumbar spondylosis which has been relieved on 2 occasions by greater than 50% by lumbar medial branch blocks at corresponding levels.  Informed consent was obtained after describing risks and benefits of the procedure with the patient, this includes bleeding, bruising, infection, paralysis and medication side effects. The patient wishes to proceed and has given written consent. The patient was placed in a prone position. The lumbar and sacral area was marked and prepped with Betadine. A 25-gauge 1-1/2 inch needle was inserted into the skin and subcutaneous tissue at 3 sites in one ML of 2% lidocaine was injected into each site. Then a 18-gauge 10 cm radio frequency needle with a 1 cm curved active tip was inserted targeting the left S1 SAP/sacral ala junction. Bone contact was made and confirmed with lateral imaging.  motor stimulation at 2 Hz confirm proper needle location followed by injection of one ML of 2% MPF lidocaine. Then the left L5 SAP/transverse process junction was targeted. Bone contact was made and confirmed with lateral imaging motor stimulation at 2 Hz confirm proper needle location followed by injection of one ML of the solution containing one ML of  2% MPF lidocaine. Then the left L4 SAP/transverse process junction was targeted. Bone contact was made and confirmed with lateral imaging. motor stimulation at 2 Hz confirm proper needle location followed by injection of one ML of the solution containing one ML of2% MPF lidocaine. Radio frequency lesion  at [facility] for 90 seconds was performed. Needles were removed. Post procedure instructions and vital signs were performed. Patient tolerated procedure well. Followup appointment was given.

## 2024-01-05 NOTE — Progress Notes (Signed)
  PROCEDURE RECORD [facility]   Name: Nathaniel Robinson DOB:1955/12/27 MRN: [id number]  Date:01/05/2024  Physician: Prentice Compton, MD    Nurse/CMA: Bari Ada, CMa  Allergies: No Known Allergies  Consent Signed: Yes.    Is patient diabetic? No.  CBG today? N/A  Pregnant: No. LMP: No LMP for male patient. (age [age])  Anticoagulants: no Anti-inflammatory: no Antibiotics: no  Procedure: Left L3-4-5 Radiofrequency Position: Prone Start Time: 12:00 pm  End Time: 12:13 pm  Fluoro Time: 35  RN/CMA Tavius Turgeon,CMA Lee, CMA    Time 11:33 am 12:21 pm    BP 113/70 120/74    Pulse 90 98    Respirations 14 14    O2 Sat 93 96    S/S 6 6    Pain Level 6/10 5/10     D/C home with no one, patient A & O X 3, D/C instructions reviewed, and sits independently.

## 2024-01-05 NOTE — Patient Instructions (Signed)
You had a radio frequency procedure today This was done to alleviate joint pain in your lumbar area We injected lidocaine which is a local anesthetic.  You may experience soreness at the injection sites. You may also experienced some irritation of the nerves that were heated I'm recommending ice for 30 minutes every 2 hours as needed for the next 24-48 hours   

## 2024-03-11 ENCOUNTER — Other Ambulatory Visit: Payer: Self-pay | Admitting: Internal Medicine

## 2024-03-11 DIAGNOSIS — G8929 Other chronic pain: Secondary | ICD-10-CM

## 2024-05-06 ENCOUNTER — Ambulatory Visit: Admitting: Internal Medicine

## 2024-07-04 ENCOUNTER — Encounter: Admitting: Physical Medicine & Rehabilitation

## 2024-07-17 ENCOUNTER — Ambulatory Visit: Admitting: Physician Assistant
